# Patient Record
Sex: Female | Born: 1945 | Race: White | Hispanic: No | Marital: Married | Smoking: Never smoker
Health system: Southern US, Community
[De-identification: ages and names within clinical notes are randomized; demographics above are authoritative.]

## PROBLEM LIST (undated history)

## (undated) DIAGNOSIS — E785 Hyperlipidemia, unspecified: Secondary | ICD-10-CM

## (undated) DIAGNOSIS — M549 Dorsalgia, unspecified: Secondary | ICD-10-CM

## (undated) DIAGNOSIS — M1990 Unspecified osteoarthritis, unspecified site: Secondary | ICD-10-CM

## (undated) DIAGNOSIS — E7800 Pure hypercholesterolemia, unspecified: Secondary | ICD-10-CM

## (undated) HISTORY — PX: ABDOMINAL HYSTERECTOMY: SHX81

---

## 2008-09-18 ENCOUNTER — Ambulatory Visit: Payer: Self-pay | Admitting: Unknown Physician Specialty

## 2010-06-01 ENCOUNTER — Ambulatory Visit: Payer: Self-pay | Admitting: Internal Medicine

## 2013-09-10 DIAGNOSIS — K635 Polyp of colon: Secondary | ICD-10-CM | POA: Insufficient documentation

## 2015-04-08 DIAGNOSIS — M5440 Lumbago with sciatica, unspecified side: Secondary | ICD-10-CM | POA: Insufficient documentation

## 2017-03-17 DIAGNOSIS — E782 Mixed hyperlipidemia: Secondary | ICD-10-CM | POA: Insufficient documentation

## 2017-03-24 DIAGNOSIS — M85859 Other specified disorders of bone density and structure, unspecified thigh: Secondary | ICD-10-CM | POA: Insufficient documentation

## 2017-07-12 DIAGNOSIS — M1612 Unilateral primary osteoarthritis, left hip: Secondary | ICD-10-CM | POA: Insufficient documentation

## 2017-10-25 ENCOUNTER — Other Ambulatory Visit: Payer: Self-pay | Admitting: Orthopedic Surgery

## 2017-10-25 DIAGNOSIS — M5136 Other intervertebral disc degeneration, lumbar region: Secondary | ICD-10-CM

## 2017-10-25 DIAGNOSIS — M5417 Radiculopathy, lumbosacral region: Secondary | ICD-10-CM

## 2017-10-25 DIAGNOSIS — M5442 Lumbago with sciatica, left side: Secondary | ICD-10-CM

## 2017-11-02 ENCOUNTER — Encounter (INDEPENDENT_AMBULATORY_CARE_PROVIDER_SITE_OTHER): Payer: Self-pay

## 2017-11-02 ENCOUNTER — Ambulatory Visit
Admission: RE | Admit: 2017-11-02 | Discharge: 2017-11-02 | Disposition: A | Discharge status: Home / Self Care | Payer: Medicare Other | Source: Ambulatory Visit | Attending: Orthopedic Surgery | Admitting: Orthopedic Surgery

## 2017-11-02 DIAGNOSIS — M5417 Radiculopathy, lumbosacral region: Secondary | ICD-10-CM | POA: Diagnosis present

## 2017-11-02 DIAGNOSIS — M5136 Other intervertebral disc degeneration, lumbar region: Secondary | ICD-10-CM

## 2017-11-02 DIAGNOSIS — M5116 Intervertebral disc disorders with radiculopathy, lumbar region: Secondary | ICD-10-CM | POA: Diagnosis not present

## 2017-11-02 DIAGNOSIS — M5442 Lumbago with sciatica, left side: Secondary | ICD-10-CM | POA: Diagnosis present

## 2017-11-02 DIAGNOSIS — M48061 Spinal stenosis, lumbar region without neurogenic claudication: Secondary | ICD-10-CM | POA: Diagnosis not present

## 2019-02-26 NOTE — Discharge Instructions (Signed)
Instructions after Total Hip Replacement ° ° °  Powell Halbert P. Rodrecus Belsky, Jr., M.D.    ° Dept. of Orthopaedics & Sports Medicine ° [facility] ° [address] ° [place], [place]  [address] ° Phone: [phone]   Fax: [phone] ° °  °DIET: °• Drink plenty of non-alcoholic fluids. °• Resume your normal diet. Include foods high in fiber. ° °ACTIVITY:  °• You may use crutches or a walker with weight-bearing as tolerated, unless instructed otherwise. °• You may be weaned off of the walker or crutches by your Physical Therapist.  °• Do NOT reach below the level of your knees or cross your legs until allowed.    °• Continue doing gentle exercises. Exercising will reduce the pain and swelling, increase motion, and prevent muscle weakness.   °• Please continue to use the TED compression stockings for 6 weeks. You may remove the stockings at night, but should reapply them in the morning. °• Do not drive or operate any equipment until instructed. ° °WOUND CARE:  °• Continue to use ice packs periodically to reduce pain and swelling. °• Keep the incision clean and dry. °• You may bathe or shower after the staples are removed at the first office visit following surgery. ° °MEDICATIONS: °• You may resume your regular medications. °• Please take the pain medication as prescribed on the medication. °• Do not take pain medication on an empty stomach. °• You have been given a prescription for a blood thinner to prevent blood clots. Please take the medication as instructed. (NOTE: After completing a 2 week course of Lovenox, take one Enteric-coated aspirin once a day.) °• Pain medications and iron supplements can cause constipation. Use a stool softener (Senokot or Colace) on a daily basis and a laxative (dulcolax or miralax) as needed. °• Do not drive or drink alcoholic beverages when taking pain medications. ° °CALL THE OFFICE FOR: °• Temperature above 101 degrees °• Excessive bleeding or drainage on the dressing. °• Excessive  swelling, coldness, or paleness of the toes. °• Persistent nausea and vomiting. ° °FOLLOW-UP:  °• You should have an appointment to return to the office in 6 weeks after surgery. °• Arrangements have been made for continuation of Physical Therapy (either home therapy or outpatient therapy). °  °

## 2019-02-27 ENCOUNTER — Encounter
Admission: RE | Admit: 2019-02-27 | Discharge: 2019-02-27 | Disposition: A | Discharge status: Home / Self Care | Payer: Medicare Other | Source: Ambulatory Visit | Attending: Orthopedic Surgery | Admitting: Orthopedic Surgery

## 2019-02-27 ENCOUNTER — Other Ambulatory Visit: Payer: Self-pay

## 2019-02-27 ENCOUNTER — Other Ambulatory Visit: Payer: Medicare Other

## 2019-02-27 DIAGNOSIS — Z20828 Contact with and (suspected) exposure to other viral communicable diseases: Secondary | ICD-10-CM | POA: Diagnosis not present

## 2019-02-27 DIAGNOSIS — Z01812 Encounter for preprocedural laboratory examination: Secondary | ICD-10-CM | POA: Insufficient documentation

## 2019-02-27 DIAGNOSIS — Z01810 Encounter for preprocedural cardiovascular examination: Secondary | ICD-10-CM | POA: Insufficient documentation

## 2019-02-27 LAB — COMPREHENSIVE METABOLIC PANEL
ALT: 20 U/L (ref 0–44)
AST: 21 U/L (ref 15–41)
Albumin: 4.7 g/dL (ref 3.5–5.0)
Alkaline Phosphatase: 69 U/L (ref 38–126)
Anion gap: 9 (ref 5–15)
BUN: 24 mg/dL — ABNORMAL HIGH (ref 8–23)
CO2: 26 mmol/L (ref 22–32)
Calcium: 10 mg/dL (ref 8.9–10.3)
Chloride: 104 mmol/L (ref 98–111)
Creatinine, Ser: 0.97 mg/dL (ref 0.44–1.00)
GFR calc Af Amer: >60 mL/min (ref >60)
GFR calc non Af Amer: 57 mL/min — ABNORMAL LOW (ref >60)
Glucose, Bld: 101 mg/dL — ABNORMAL HIGH (ref 70–99)
Potassium: 4 mmol/L (ref 3.5–5.1)
Sodium: 139 mmol/L (ref 135–145)
Total Bilirubin: 0.6 mg/dL (ref 0.3–1.2)
Total Protein: 7.3 g/dL (ref 6.5–8.1)

## 2019-02-27 LAB — URINALYSIS, ROUTINE W REFLEX MICROSCOPIC
Bilirubin Urine: NEGATIVE
Glucose, UA: NEGATIVE mg/dL
Hgb urine dipstick: NEGATIVE
Ketones, ur: NEGATIVE mg/dL
Leukocytes,Ua: NEGATIVE
Nitrite: NEGATIVE
Protein, ur: NEGATIVE mg/dL
Specific Gravity, Urine: 1.008 (ref 1.005–1.030)
WBC, UA: NONE SEEN WBC/hpf (ref 0–5)
pH: 7 (ref 5.0–8.0)

## 2019-02-27 LAB — CBC WITH DIFFERENTIAL/PLATELET
Abs Immature Granulocytes: 0.04 10*3/uL (ref 0.00–0.07)
Basophils Absolute: 0.1 10*3/uL (ref 0.0–0.1)
Basophils Relative: 1 %
Eosinophils Absolute: 0.2 10*3/uL (ref 0.0–0.5)
Eosinophils Relative: 2 %
HCT: 44.8 % (ref 36.0–46.0)
Hemoglobin: 14.5 g/dL (ref 12.0–15.0)
Immature Granulocytes: 0 %
Lymphocytes Relative: 12 %
Lymphs Abs: 1.2 10*3/uL (ref 0.7–4.0)
MCH: 29.7 pg (ref 26.0–34.0)
MCHC: 32.4 g/dL (ref 30.0–36.0)
MCV: 91.8 fL (ref 80.0–100.0)
Monocytes Absolute: 0.6 10*3/uL (ref 0.1–1.0)
Monocytes Relative: 6 %
Neutro Abs: 8 10*3/uL — ABNORMAL HIGH (ref 1.7–7.7)
Neutrophils Relative %: 79 %
Platelets: 280 10*3/uL (ref 150–400)
RBC: 4.88 MIL/uL (ref 3.87–5.11)
RDW: 14.9 % (ref 11.5–15.5)
WBC: 10.1 10*3/uL (ref 4.0–10.5)
nRBC: 0 % (ref 0.0–0.2)

## 2019-02-27 LAB — TYPE AND SCREEN
ABO/RH(D): O POS
Antibody Screen: NEGATIVE

## 2019-02-27 LAB — SURGICAL PCR SCREEN
MRSA, PCR: NEGATIVE
Staphylococcus aureus: NEGATIVE

## 2019-02-27 LAB — SEDIMENTATION RATE: Sed Rate: 7 mm/hr (ref 0–30)

## 2019-02-27 LAB — PROTIME-INR
INR: 1 (ref 0.8–1.2)
Prothrombin Time: 13 seconds (ref 11.4–15.2)

## 2019-02-27 LAB — APTT: aPTT: 30 seconds (ref 24–36)

## 2019-02-27 LAB — C-REACTIVE PROTEIN: CRP: <0.8 mg/dL (ref <1.0)

## 2019-02-27 NOTE — Patient Instructions (Signed)
Your procedure is scheduled on: Wed. 9/16 Report to Day Surgery. To find out your arrival time please call [phone] between 1PM - 3PM on Tues 9/15.  Remember: Instructions that are not followed completely may result in serious medical risk,  up to and including death, or upon the discretion of your surgeon and anesthesiologist your  surgery may need to be rescheduled.     _X__ 1. Do not eat food after midnight the night before your procedure.                 No gum chewing or hard candies. You may drink clear liquids up to 2 hours                 before you are scheduled to arrive for your surgery- DO not drink clear                 liquids within 2 hours of the start of your surgery.                 Clear Liquids include:  water, apple juice without pulp, clear carbohydrate                 drink such as Clearfast of Gatorade, Black Coffee or Tea (Do not add                 anything to coffee or tea).  __X__2.  On the morning of surgery brush your teeth with toothpaste and water, you                may rinse your mouth with mouthwash if you wish.  Do not swallow any toothpaste of mouthwash.     _X__ 3.  No Alcohol for 24 hours before or after surgery.   ___ 4.  Do Not Smoke or use e-cigarettes For 24 Hours Prior to Your Surgery.                 Do not use any chewable tobacco products for at least 6 hours prior to                 surgery.  ____  5.  Bring all medications with you on the day of surgery if instructed.   _x___  6.  Notify your doctor if there is any change in your medical condition      (cold, fever, infections).     Do not wear jewelry, make-up, hairpins, clips or nail polish. Do not wear lotions, powders, or perfumes. You may wear deodorant. Do not shave 48 hours prior to surgery. Men may shave face and neck. Do not bring valuables to the hospital.    [facility] is not responsible for any belongings or valuables.  Contacts, dentures  or bridgework may not be worn into surgery. Leave your suitcase in the car. After surgery it may be brought to your room. For patients admitted to the hospital, discharge time is determined by your treatment team.   Patients discharged the day of surgery will not be allowed to drive home.   Please read over the following fact sheets that you were given:    _x___ Take these medicines the morning of surgery with A SIP OF WATER:    1. cetirizine (ZYRTEC) 10 MG tablet  2. traMADol (ULTRAM) 50 MG tablet  3.   4.  5.  6.  ____ Fleet Enema (as directed)   _x___ Use CHG Soap as directed  ____ Use inhalers on the day of surgery  ____ Stop metformin 2 days prior to surgery    ____ Take 1/2 of usual insulin dose the night before surgery. No insulin the morning          of surgery.   ____ Stop Coumadin/Plavix/aspirin on   __x__ Stop Anti-inflammatories meloxicam (MOBIC) 15 MG tablet  tomorrow   __x__ Stop supplements until after surgery. Flaxseed, Linseed, (FLAXSEED OIL) 1000 MG CAPS   ____ Bring C-Pap to the hospital.

## 2019-02-27 NOTE — Pre-Procedure Instructions (Signed)
Abnormal EKG, called Dr Ronelle Nigh .  Medical clearance requested.  Called and faxed Dr Clydell Hakim office and left message with Tiffany.

## 2019-02-28 LAB — URINE CULTURE
Culture: NO GROWTH
Special Requests: NORMAL

## 2019-03-02 ENCOUNTER — Other Ambulatory Visit
Admission: RE | Admit: 2019-03-02 | Discharge: 2019-03-02 | Disposition: A | Discharge status: Home / Self Care | Payer: Medicare Other | Source: Ambulatory Visit | Attending: Orthopedic Surgery | Admitting: Orthopedic Surgery

## 2019-03-02 ENCOUNTER — Other Ambulatory Visit: Payer: Medicare Other

## 2019-03-02 ENCOUNTER — Other Ambulatory Visit: Payer: Self-pay

## 2019-03-02 DIAGNOSIS — Z01812 Encounter for preprocedural laboratory examination: Secondary | ICD-10-CM | POA: Diagnosis not present

## 2019-03-03 LAB — SARS CORONAVIRUS 2 (TAT 6-24 HRS): SARS Coronavirus 2: NEGATIVE

## 2019-03-06 MED ORDER — TRANEXAMIC ACID-NACL 1000-0.7 MG/100ML-% IV SOLN
1000.0000 mg | INTRAVENOUS | Status: DC
  Filled 2019-03-06: qty 100

## 2019-03-07 ENCOUNTER — Inpatient Hospital Stay: Payer: Medicare Other | Admitting: Anesthesiology

## 2019-03-07 ENCOUNTER — Encounter: Payer: Self-pay | Admitting: Orthopedic Surgery

## 2019-03-07 ENCOUNTER — Encounter
Admission: RE | Disposition: A | Discharge status: Home Health | Payer: Self-pay | Source: Ambulatory Visit | Attending: Orthopedic Surgery

## 2019-03-07 ENCOUNTER — Inpatient Hospital Stay: Payer: Medicare Other

## 2019-03-07 ENCOUNTER — Other Ambulatory Visit: Payer: Self-pay

## 2019-03-07 ENCOUNTER — Inpatient Hospital Stay
Admission: RE | Admit: 2019-03-07 | Discharge: 2019-03-09 | DRG: 470 | Disposition: A | Discharge status: Home Health | Payer: Medicare Other | Source: Ambulatory Visit | Attending: Orthopedic Surgery | Admitting: Orthopedic Surgery

## 2019-03-07 DIAGNOSIS — M1612 Unilateral primary osteoarthritis, left hip: Secondary | ICD-10-CM | POA: Diagnosis present

## 2019-03-07 DIAGNOSIS — E785 Hyperlipidemia, unspecified: Secondary | ICD-10-CM | POA: Diagnosis present

## 2019-03-07 DIAGNOSIS — Z86010 Personal history of colonic polyps: Secondary | ICD-10-CM

## 2019-03-07 DIAGNOSIS — Z96642 Presence of left artificial hip joint: Secondary | ICD-10-CM

## 2019-03-07 DIAGNOSIS — Z79899 Other long term (current) drug therapy: Secondary | ICD-10-CM | POA: Diagnosis not present

## 2019-03-07 DIAGNOSIS — L309 Dermatitis, unspecified: Secondary | ICD-10-CM | POA: Insufficient documentation

## 2019-03-07 DIAGNOSIS — Z96649 Presence of unspecified artificial hip joint: Secondary | ICD-10-CM

## 2019-03-07 DIAGNOSIS — M25552 Pain in left hip: Secondary | ICD-10-CM | POA: Diagnosis present

## 2019-03-07 HISTORY — PX: TOTAL HIP ARTHROPLASTY: SHX124

## 2019-03-07 LAB — ABO/RH: ABO/RH(D): O POS

## 2019-03-07 SURGERY — ARTHROPLASTY, HIP, TOTAL,POSTERIOR APPROACH
Anesthesia: Spinal | Site: Hip | Laterality: Left

## 2019-03-07 MED ORDER — EPHEDRINE SULFATE 50 MG/ML IJ SOLN
INTRAMUSCULAR | Status: DC | PRN
  Administered 2019-03-07 (×4): 10 mg via INTRAVENOUS

## 2019-03-07 MED ORDER — ONDANSETRON HCL 4 MG PO TABS: 4.0000 mg | ORAL_TABLET | Freq: Four times a day (QID) | ORAL | Status: DC | PRN

## 2019-03-07 MED ORDER — ALUM & MAG HYDROXIDE-SIMETH 200-200-20 MG/5ML PO SUSP: 30.0000 mL | ORAL | Status: DC | PRN

## 2019-03-07 MED ORDER — FERROUS SULFATE 325 (65 FE) MG PO TABS
325.0000 mg | ORAL_TABLET | Freq: Two times a day (BID) | ORAL | Status: DC
  Administered 2019-03-07 – 2019-03-09 (×4): 325 mg via ORAL
  Filled 2019-03-07 (×4): qty 1

## 2019-03-07 MED ORDER — METOCLOPRAMIDE HCL 10 MG PO TABS: 5.0000 mg | ORAL_TABLET | Freq: Three times a day (TID) | ORAL | Status: DC | PRN

## 2019-03-07 MED ORDER — METOCLOPRAMIDE HCL 10 MG PO TABS
10.0000 mg | ORAL_TABLET | Freq: Three times a day (TID) | ORAL | Status: DC
  Administered 2019-03-07 – 2019-03-09 (×7): 10 mg via ORAL
  Filled 2019-03-07 (×7): qty 1

## 2019-03-07 MED ORDER — GABAPENTIN 300 MG PO CAPS
300.0000 mg | ORAL_CAPSULE | Freq: Every day | ORAL | Status: DC
  Administered 2019-03-07 – 2019-03-08 (×2): 300 mg via ORAL
  Filled 2019-03-07 (×2): qty 1

## 2019-03-07 MED ORDER — ONDANSETRON HCL 4 MG/2ML IJ SOLN: 4.0000 mg | Freq: Four times a day (QID) | INTRAMUSCULAR | Status: DC | PRN

## 2019-03-07 MED ORDER — ONDANSETRON HCL 4 MG/2ML IJ SOLN: 4.0000 mg | Freq: Once | INTRAMUSCULAR | Status: DC | PRN

## 2019-03-07 MED ORDER — BISACODYL 10 MG RE SUPP: 10.0000 mg | Freq: Every day | RECTAL | Status: DC | PRN

## 2019-03-07 MED ORDER — LACTATED RINGERS IV SOLN
INTRAVENOUS | Status: DC
  Administered 2019-03-07: 08:00:00 via INTRAVENOUS

## 2019-03-07 MED ORDER — DIPHENHYDRAMINE HCL 12.5 MG/5ML PO ELIX: 12.5000 mg | ORAL_SOLUTION | ORAL | Status: DC | PRN

## 2019-03-07 MED ORDER — ENSURE PRE-SURGERY PO LIQD
296.0000 mL | Freq: Once | ORAL | Status: DC
  Filled 2019-03-07: qty 296

## 2019-03-07 MED ORDER — CELECOXIB 200 MG PO CAPS
400.0000 mg | ORAL_CAPSULE | Freq: Once | ORAL | Status: AC
  Administered 2019-03-07: 08:00:00 400 mg via ORAL

## 2019-03-07 MED ORDER — CEFAZOLIN SODIUM-DEXTROSE 2-4 GM/100ML-% IV SOLN
2.0000 g | INTRAVENOUS | Status: AC
  Administered 2019-03-07: 09:00:00 2 g via INTRAVENOUS

## 2019-03-07 MED ORDER — BUPIVACAINE HCL (PF) 0.5 % IJ SOLN
INTRAMUSCULAR | Status: AC
  Filled 2019-03-07: qty 10

## 2019-03-07 MED ORDER — PROPOFOL 500 MG/50ML IV EMUL
INTRAVENOUS | Status: AC
  Filled 2019-03-07: qty 50

## 2019-03-07 MED ORDER — FAMOTIDINE 20 MG PO TABS
ORAL_TABLET | ORAL | Status: AC
  Administered 2019-03-07: 20 mg via ORAL
  Filled 2019-03-07: qty 1

## 2019-03-07 MED ORDER — TRANEXAMIC ACID-NACL 1000-0.7 MG/100ML-% IV SOLN
1000.0000 mg | Freq: Once | INTRAVENOUS | Status: AC
  Administered 2019-03-07: 1000 mg via INTRAVENOUS
  Filled 2019-03-07: qty 100

## 2019-03-07 MED ORDER — GABAPENTIN 300 MG PO CAPS
ORAL_CAPSULE | ORAL | Status: AC
  Administered 2019-03-07: 08:00:00 300 mg via ORAL
  Filled 2019-03-07: qty 1

## 2019-03-07 MED ORDER — BUPIVACAINE HCL (PF) 0.5 % IJ SOLN
INTRAMUSCULAR | Status: DC | PRN
  Administered 2019-03-07: 3 mL

## 2019-03-07 MED ORDER — OXYCODONE HCL 5 MG PO TABS: 10.0000 mg | ORAL_TABLET | ORAL | Status: DC | PRN

## 2019-03-07 MED ORDER — TRANEXAMIC ACID-NACL 1000-0.7 MG/100ML-% IV SOLN
INTRAVENOUS | Status: DC | PRN
  Administered 2019-03-07: 1000 mg via INTRAVENOUS

## 2019-03-07 MED ORDER — SODIUM CHLORIDE 0.9 % IV SOLN
INTRAVENOUS | Status: DC
  Administered 2019-03-07 – 2019-03-08 (×2): via INTRAVENOUS

## 2019-03-07 MED ORDER — OXYMETAZOLINE HCL 0.05 % NA SOLN
1.0000 | Freq: Two times a day (BID) | NASAL | Status: DC
  Administered 2019-03-07 – 2019-03-08 (×4): 1 via NASAL
  Filled 2019-03-07: qty 15

## 2019-03-07 MED ORDER — METOCLOPRAMIDE HCL 5 MG/ML IJ SOLN: 5.0000 mg | Freq: Three times a day (TID) | INTRAMUSCULAR | Status: DC | PRN

## 2019-03-07 MED ORDER — MAGNESIUM HYDROXIDE 400 MG/5ML PO SUSP
30.0000 mL | Freq: Every day | ORAL | Status: DC
  Administered 2019-03-07 – 2019-03-08 (×2): 30 mL via ORAL
  Filled 2019-03-07 (×2): qty 30

## 2019-03-07 MED ORDER — MENTHOL 3 MG MT LOZG
1.0000 | LOZENGE | OROMUCOSAL | Status: DC | PRN
  Filled 2019-03-07: qty 9

## 2019-03-07 MED ORDER — FLUTICASONE FUROATE 27.5 MCG/SPRAY NA SUSP: 1.0000 | Freq: Every evening | NASAL | Status: DC

## 2019-03-07 MED ORDER — PHENYLEPHRINE HCL (PRESSORS) 10 MG/ML IV SOLN
INTRAVENOUS | Status: AC
  Filled 2019-03-07: qty 1

## 2019-03-07 MED ORDER — CELECOXIB 200 MG PO CAPS
200.0000 mg | ORAL_CAPSULE | Freq: Two times a day (BID) | ORAL | Status: DC
  Administered 2019-03-07 – 2019-03-09 (×4): 200 mg via ORAL
  Filled 2019-03-07 (×4): qty 1

## 2019-03-07 MED ORDER — NEOMYCIN-POLYMYXIN B GU 40-200000 IR SOLN
Status: DC | PRN
  Administered 2019-03-07: 2 mL

## 2019-03-07 MED ORDER — SODIUM CHLORIDE (PF) 0.9 % IJ SOLN
INTRAMUSCULAR | Status: AC
  Filled 2019-03-07: qty 10

## 2019-03-07 MED ORDER — FENTANYL CITRATE (PF) 100 MCG/2ML IJ SOLN
25.0000 ug | INTRAMUSCULAR | Status: DC | PRN
  Administered 2019-03-07 (×2): 25 ug via INTRAVENOUS

## 2019-03-07 MED ORDER — FLEET ENEMA 7-19 GM/118ML RE ENEM: 1.0000 | ENEMA | Freq: Once | RECTAL | Status: DC | PRN

## 2019-03-07 MED ORDER — CHLORHEXIDINE GLUCONATE 4 % EX LIQD: 60.0000 mL | Freq: Once | CUTANEOUS | Status: DC

## 2019-03-07 MED ORDER — ENOXAPARIN SODIUM 30 MG/0.3ML ~~LOC~~ SOLN
30.0000 mg | Freq: Two times a day (BID) | SUBCUTANEOUS | Status: DC
  Administered 2019-03-08 – 2019-03-09 (×3): 30 mg via SUBCUTANEOUS
  Filled 2019-03-07 (×3): qty 0.3

## 2019-03-07 MED ORDER — PHENYLEPHRINE HCL (PRESSORS) 10 MG/ML IV SOLN
INTRAVENOUS | Status: DC | PRN
  Administered 2019-03-07 (×10): 100 ug via INTRAVENOUS

## 2019-03-07 MED ORDER — TRAMADOL HCL 50 MG PO TABS: 50.0000 mg | ORAL_TABLET | ORAL | Status: DC | PRN

## 2019-03-07 MED ORDER — ACETAMINOPHEN 10 MG/ML IV SOLN
INTRAVENOUS | Status: DC | PRN
  Administered 2019-03-07: 1000 mg via INTRAVENOUS

## 2019-03-07 MED ORDER — LORATADINE 10 MG PO TABS
10.0000 mg | ORAL_TABLET | Freq: Every day | ORAL | Status: DC
  Administered 2019-03-07 – 2019-03-09 (×3): 10 mg via ORAL
  Filled 2019-03-07 (×3): qty 1

## 2019-03-07 MED ORDER — FAMOTIDINE 20 MG PO TABS
20.0000 mg | ORAL_TABLET | Freq: Once | ORAL | Status: AC
  Administered 2019-03-07: 08:00:00 20 mg via ORAL

## 2019-03-07 MED ORDER — ACETAMINOPHEN 10 MG/ML IV SOLN
1000.0000 mg | Freq: Four times a day (QID) | INTRAVENOUS | Status: AC
  Administered 2019-03-07 – 2019-03-08 (×3): 1000 mg via INTRAVENOUS
  Filled 2019-03-07 (×3): qty 100

## 2019-03-07 MED ORDER — FENTANYL CITRATE (PF) 100 MCG/2ML IJ SOLN
INTRAMUSCULAR | Status: AC
  Administered 2019-03-07: 25 ug via INTRAVENOUS
  Filled 2019-03-07: qty 2

## 2019-03-07 MED ORDER — CELECOXIB 200 MG PO CAPS
ORAL_CAPSULE | ORAL | Status: AC
  Administered 2019-03-07: 08:00:00 400 mg via ORAL
  Filled 2019-03-07: qty 2

## 2019-03-07 MED ORDER — HYDROMORPHONE HCL 1 MG/ML IJ SOLN: 0.5000 mg | INTRAMUSCULAR | Status: DC | PRN

## 2019-03-07 MED ORDER — ACETAMINOPHEN 10 MG/ML IV SOLN
INTRAVENOUS | Status: AC
  Filled 2019-03-07: qty 100

## 2019-03-07 MED ORDER — PHENOL 1.4 % MT LIQD
1.0000 | OROMUCOSAL | Status: DC | PRN
  Filled 2019-03-07: qty 177

## 2019-03-07 MED ORDER — CEFAZOLIN SODIUM-DEXTROSE 2-4 GM/100ML-% IV SOLN
INTRAVENOUS | Status: AC
  Filled 2019-03-07: qty 100

## 2019-03-07 MED ORDER — DEXAMETHASONE SODIUM PHOSPHATE 10 MG/ML IJ SOLN
8.0000 mg | Freq: Once | INTRAMUSCULAR | Status: AC
  Administered 2019-03-07: 08:00:00 8 mg via INTRAVENOUS

## 2019-03-07 MED ORDER — ACETAMINOPHEN 325 MG PO TABS: 325.0000 mg | ORAL_TABLET | Freq: Four times a day (QID) | ORAL | Status: DC | PRN

## 2019-03-07 MED ORDER — GABAPENTIN 300 MG PO CAPS
300.0000 mg | ORAL_CAPSULE | Freq: Once | ORAL | Status: AC
  Administered 2019-03-07: 08:00:00 300 mg via ORAL

## 2019-03-07 MED ORDER — CEFAZOLIN SODIUM-DEXTROSE 2-4 GM/100ML-% IV SOLN
2.0000 g | Freq: Four times a day (QID) | INTRAVENOUS | Status: AC
  Administered 2019-03-07 – 2019-03-08 (×4): 2 g via INTRAVENOUS
  Filled 2019-03-07 (×5): qty 100

## 2019-03-07 MED ORDER — FLAXSEED OIL 1000 MG PO CAPS: 1000.0000 mg | ORAL_CAPSULE | Freq: Every day | ORAL | Status: DC

## 2019-03-07 MED ORDER — OXYCODONE HCL 5 MG PO TABS
5.0000 mg | ORAL_TABLET | ORAL | Status: DC | PRN
  Administered 2019-03-07 – 2019-03-08 (×2): 5 mg via ORAL
  Filled 2019-03-07 (×2): qty 1

## 2019-03-07 MED ORDER — PANTOPRAZOLE SODIUM 40 MG PO TBEC
40.0000 mg | DELAYED_RELEASE_TABLET | Freq: Two times a day (BID) | ORAL | Status: DC
  Administered 2019-03-07 – 2019-03-09 (×4): 40 mg via ORAL
  Filled 2019-03-07 (×4): qty 1

## 2019-03-07 MED ORDER — PROPOFOL 500 MG/50ML IV EMUL
INTRAVENOUS | Status: DC | PRN
  Administered 2019-03-07: 100 ug/kg/min via INTRAVENOUS

## 2019-03-07 MED ORDER — EPHEDRINE SULFATE 50 MG/ML IJ SOLN
INTRAMUSCULAR | Status: AC
  Filled 2019-03-07: qty 1

## 2019-03-07 MED ORDER — SENNOSIDES-DOCUSATE SODIUM 8.6-50 MG PO TABS
1.0000 | ORAL_TABLET | Freq: Two times a day (BID) | ORAL | Status: DC
  Administered 2019-03-07 – 2019-03-08 (×2): 1 via ORAL
  Filled 2019-03-07 (×3): qty 1

## 2019-03-07 MED ORDER — FLUOCINONIDE 0.05 % EX CREA
TOPICAL_CREAM | Freq: Two times a day (BID) | CUTANEOUS | Status: DC | PRN
  Filled 2019-03-07: qty 30

## 2019-03-07 MED ORDER — DEXAMETHASONE SODIUM PHOSPHATE 10 MG/ML IJ SOLN
INTRAMUSCULAR | Status: AC
  Administered 2019-03-07: 8 mg via INTRAVENOUS
  Filled 2019-03-07: qty 1

## 2019-03-07 SURGICAL SUPPLY — 54 items
BLADE DRUM FLTD (BLADE) ×3 IMPLANT
BLADE SAW 90X25X1.19 OSCILLAT (BLADE) ×3 IMPLANT
CANISTER SUCT 1200ML W/VALVE (MISCELLANEOUS) ×3 IMPLANT
CANISTER SUCT 3000ML PPV (MISCELLANEOUS) ×6 IMPLANT
CARTRIDGE OIL MAESTRO DRILL (MISCELLANEOUS) ×1 IMPLANT
COVER WAND RF STERILE (DRAPES) ×3 IMPLANT
CUP ACETBLR 48 OD 100 SERIES (Hips) ×3 IMPLANT
DIFFUSER DRILL AIR PNEUMATIC (MISCELLANEOUS) ×3 IMPLANT
DRAPE 3/4 80X56 (DRAPES) ×3 IMPLANT
DRAPE INCISE IOBAN 66X60 STRL (DRAPES) ×3 IMPLANT
DRSG DERMACEA 8X12 NADH (GAUZE/BANDAGES/DRESSINGS) ×3 IMPLANT
DRSG OPSITE POSTOP 4X12 (GAUZE/BANDAGES/DRESSINGS) ×3 IMPLANT
DRSG OPSITE POSTOP 4X14 (GAUZE/BANDAGES/DRESSINGS) IMPLANT
DRSG TEGADERM 4X4.75 (GAUZE/BANDAGES/DRESSINGS) ×3 IMPLANT
DURAPREP 26ML APPLICATOR (WOUND CARE) ×3 IMPLANT
ELECT REM PT RETURN 9FT ADLT (ELECTROSURGICAL) ×3
ELECTRODE REM PT RTRN 9FT ADLT (ELECTROSURGICAL) ×1 IMPLANT
GLOVE BIOGEL M STRL SZ7.5 (GLOVE) ×6 IMPLANT
GLOVE INDICATOR 8.0 STRL GRN (GLOVE) ×3 IMPLANT
GOWN STRL REUS W/ TWL LRG LVL3 (GOWN DISPOSABLE) ×2 IMPLANT
GOWN STRL REUS W/TWL LRG LVL3 (GOWN DISPOSABLE) ×4
HEAD FEM STD 32X+1 STRL (Hips) ×3 IMPLANT
HEMOVAC 400CC 10FR (MISCELLANEOUS) ×3 IMPLANT
HOLDER FOLEY CATH W/STRAP (MISCELLANEOUS) ×3 IMPLANT
HOOD PEEL AWAY FLYTE STAYCOOL (MISCELLANEOUS) ×6 IMPLANT
KIT TURNOVER KIT A (KITS) ×3 IMPLANT
LINER ACET 32X48 (Liner) ×3 IMPLANT
MANIFOLD NEPTUNE II (INSTRUMENTS) ×3 IMPLANT
NDL SAFETY ECLIPSE 18X1.5 (NEEDLE) ×1 IMPLANT
NEEDLE HYPO 18GX1.5 SHARP (NEEDLE) ×2
NS IRRIG 500ML POUR BTL (IV SOLUTION) ×3 IMPLANT
OIL CARTRIDGE MAESTRO DRILL (MISCELLANEOUS) ×3
PACK HIP PROSTHESIS (MISCELLANEOUS) ×3 IMPLANT
PENCIL SMOKE ULTRAEVAC 22 CON (MISCELLANEOUS) ×3 IMPLANT
PIN STEIN THRED 5/32 (Pin) ×3 IMPLANT
PULSAVAC PLUS IRRIG FAN TIP (DISPOSABLE) ×3
SOL .9 NS 3000ML IRR  AL (IV SOLUTION) ×2
SOL .9 NS 3000ML IRR UROMATIC (IV SOLUTION) ×1 IMPLANT
SOL PREP PVP 2OZ (MISCELLANEOUS) ×3
SOLUTION PREP PVP 2OZ (MISCELLANEOUS) ×1 IMPLANT
SPONGE DRAIN TRACH 4X4 STRL 2S (GAUZE/BANDAGES/DRESSINGS) ×3 IMPLANT
STAPLER SKIN PROX 35W (STAPLE) ×3 IMPLANT
STEM AML 12X155X30 STD SM 6IN (Joint) ×3 IMPLANT
SUT ETHIBOND #5 BRAIDED 30INL (SUTURE) ×3 IMPLANT
SUT VIC AB 0 CT1 36 (SUTURE) ×3 IMPLANT
SUT VIC AB 1 CT1 36 (SUTURE) ×6 IMPLANT
SUT VIC AB 2-0 CT1 27 (SUTURE) ×2
SUT VIC AB 2-0 CT1 TAPERPNT 27 (SUTURE) ×1 IMPLANT
SYR 20ML LL LF (SYRINGE) ×3 IMPLANT
TAPE CLOTH 3X10 WHT NS LF (GAUZE/BANDAGES/DRESSINGS) ×3 IMPLANT
TAPE TRANSPORE STRL 2 31045 (GAUZE/BANDAGES/DRESSINGS) ×3 IMPLANT
TIP FAN IRRIG PULSAVAC PLUS (DISPOSABLE) ×1 IMPLANT
TOWEL OR 17X26 4PK STRL BLUE (TOWEL DISPOSABLE) ×3 IMPLANT
TRAY FOLEY MTR SLVR 16FR STAT (SET/KITS/TRAYS/PACK) ×3 IMPLANT

## 2019-03-07 NOTE — Anesthesia Preprocedure Evaluation (Signed)
Anesthesia Evaluation  Patient identified by MRN, date of birth, ID band Patient awake    Reviewed: Allergy & Precautions, NPO status , Patient's Chart, lab work & pertinent test results  History of Anesthesia Complications Negative for: history of anesthetic complications  Airway Mallampati: III       Dental   Pulmonary neg sleep apnea, neg COPD, Not current smoker,           Cardiovascular (-) hypertension(-) Past MI and (-) CHF (-) dysrhythmias (-) Valvular Problems/Murmurs     Neuro/Psych neg Seizures    GI/Hepatic Neg liver ROS, GERD  Controlled,  Endo/Other  neg diabetes  Renal/GU negative Renal ROS     Musculoskeletal   Abdominal   Peds  Hematology   Anesthesia Other Findings   Reproductive/Obstetrics                             Anesthesia Physical Anesthesia Plan  ASA: II  Anesthesia Plan: Spinal   Post-op Pain Management:    Induction:   PONV Risk Score and Plan:   Airway Management Planned:   Additional Equipment:   Intra-op Plan:   Post-operative Plan:   Informed Consent: I have reviewed the patients History and Physical, chart, labs and discussed the procedure including the risks, benefits and alternatives for the proposed anesthesia with the patient or authorized representative who has indicated his/her understanding and acceptance.       Plan Discussed with:   Anesthesia Plan Comments:         Anesthesia Quick Evaluation

## 2019-03-07 NOTE — Progress Notes (Signed)
PHARMACIST - PHYSICIAN ORDER COMMUNICATION  CONCERNING: P&T Medication Policy on Herbal Medications  DESCRIPTION:  This patient's order for:  Flaxseed Oil CAPS 1,000 mg  has been noted.  This product(s) is classified as an "herbal" or natural product. Due to a lack of definitive safety studies or FDA approval, nonstandard manufacturing practices, plus the potential risk of unknown drug-drug interactions while on inpatient medications, the Pharmacy and Therapeutics Committee does not permit the use of "herbal" or natural products of this type within [facility].   ACTION TAKEN: The pharmacy department is unable to verify this order at this time.  Please reevaluate patient's clinical condition at discharge and address if the herbal or natural product(s) should be resumed at that time.  Pernell Dupre, PharmD, BCPS Clinical Pharmacist 03/07/2019 1:47 PM

## 2019-03-07 NOTE — Anesthesia Post-op Follow-up Note (Signed)
Anesthesia QCDR form completed.        

## 2019-03-07 NOTE — Transfer of Care (Signed)
Immediate Anesthesia Transfer of Care Note  Patient: Lindsey Freeman  Procedure(s) Performed: Procedure(s): TOTAL HIP ARTHROPLASTY (Left)  Patient Location: PACU  Anesthesia Type:Spinal  Level of Consciousness: awake, alert  and oriented  Airway & Oxygen Therapy: Patient Spontanous Breathing and Patient connected to face mask oxygen  Post-op Assessment: Report given to RN and Post -op Vital signs reviewed and stable  Post vital signs: Reviewed and stable  Last Vitals:  Vitals:   03/07/19 0720 03/07/19 1147  BP: (!) 157/76 115/68  Pulse: 78   Resp:  14  Temp: 36.6 C (!) 36.3 C  SpO2: 123XX123 [id number]    Complications: No apparent anesthesia complications

## 2019-03-07 NOTE — H&P (Signed)
The patient has been re-examined, and the chart reviewed, and there have been no interval changes to the documented history and physical.    The risks, benefits, and alternatives have been discussed at length. The patient expressed understanding of the risks benefits and agreed with plans for surgical intervention.  James P. Hooten, Jr. M.D.    

## 2019-03-07 NOTE — TOC Progression Note (Signed)
Transition of Care [facility]) - Progression Note    Patient Details  Name: Lindsey Freeman MRN: [id number] Date of Birth: 05-22-1946  [facility] of [facility]) CM/SW Contact  Su Hilt, RN Phone Number: 03/07/2019, 9:47 AM  Clinical Narrative:     Requested the price of Lovenox, will notify the patient once obtained       Expected Discharge Plan and Services                                                 Social Determinants of Health (SDOH) Interventions    Readmission Risk Interventions No flowsheet data found.

## 2019-03-07 NOTE — Evaluation (Signed)
Physical Therapy Evaluation Patient Details Name: Lindsey Freeman MRN: [id number] DOB: Apr 08, 1946 Today's Date: 03/07/2019   History of Present Illness  Pt. is a 73 y/o F s/p THA; PMH is unremarkable. Posterior hip precautions apply.  Clinical Impression  Upon entry, pt was positioned in supine on her bed with pain rated at a 6/10 on NPS. She is expecting to return home to a single-level home with 5 stairs to enter and a R rail. Her husband will be available 24/7 following to assist. Pt was educated on her precautions at the start of the session. Post-op exercises were performed without physical assistance. She was able to perform bed mobility activities with min assist required to maneuver the surgical leg and verbal cueing to prevent excess hip flexion. Transfer sit<>stand required min guard as well as verbal cueing to prevent excess hip flexion. Pt was able to amb ~ 6 ft during treatment today with use of RW. Amb speed was slow and with reduced stride length; cueing required to prevent toeing in during turns and CGA from PT throughout movement. Pt will continue to benefit from skilled PT to address deficits in strength, ROM, mobility, and pain management. Current recommendation for follow-up care is HHPT.    Follow Up Recommendations Home health PT    Equipment Recommendations  None recommended by PT(Pt. has access to RW and [facility])    Recommendations for Other Services       Precautions / Restrictions Precautions Precautions: Posterior Hip Precaution Booklet Issued: Yes (comment) Restrictions Weight Bearing Restrictions: Yes(WBAT)      Mobility  Bed Mobility Overal bed mobility: Needs Assistance Bed Mobility: Supine to Sit     Supine to sit: Min assist     General bed mobility comments: Min assist for maneuvering surgical leg; cueing to prevent hip flexion  Transfers Overall transfer level: Needs assistance Equipment used: Rolling walker (2 wheeled) Transfers: Sit to/from  Stand Sit to Stand: Min guard         General transfer comment: Cueing required to prevent hip flexion   Ambulation/Gait Ambulation/Gait assistance: Min guard Gait Distance (Feet): 6 Feet Assistive device: Rolling walker (2 wheeled) Gait Pattern/deviations: Step-through pattern;Decreased stride length;Antalgic     General Gait Details: Pt. amb 6 ft with antalgic gate 2/2 L THA. Amb speed was slow and with reduced stride lengthi; cueing required to prevent toeing in during turns  Financial trader Rankin (Stroke Patients Only)       Balance                                             Pertinent Vitals/Pain Pain Assessment: 0-10 Pain Score: 6  Pain Location: L hip Pain Descriptors / Indicators: Discomfort;Sore;Aching Pain Intervention(s): Limited activity within patient's tolerance    Home Living Family/patient expects to be discharged to:: Private residence Living Arrangements: Spouse/significant other Available Help at Discharge: Family Type of Home: House Home Access: Stairs to enter Entrance Stairs-Rails: Right Entrance Stairs-Number of Steps: 5(stairs are long) Home Layout: One level Home Equipment: Environmental consultant - 2 wheels;Cane - single point;Bedside commode;Grab bars - toilet      Prior Function Level of Independence: Independent with assistive device(s)         Comments: Pt used SPC to amb prior to surgery  Hand Dominance        Extremity/Trunk Assessment   Upper Extremity Assessment Upper Extremity Assessment: Overall WFL for tasks assessed    Lower Extremity Assessment Lower Extremity Assessment: LLE deficits/detail LLE Deficits / Details: L LE weakness 2/2 THA       Communication   Communication: No difficulties  Cognition Arousal/Alertness: Awake/alert Behavior During Therapy: WFL for tasks assessed/performed Overall Cognitive Status: Within Functional Limits for tasks assessed                                         General Comments      Exercises Total Joint Exercises Ankle Circles/Pumps: 10 reps;Supine Quad Sets: 10 reps;Supine Gluteal Sets: 10 reps;Supine Hip ABduction/ADduction: 10 reps;Supine Straight Leg Raises: 10 reps;Supine   Assessment/Plan    PT Assessment Patient needs continued PT services  PT Problem List Decreased strength;Decreased range of motion;Decreased balance;Decreased mobility       PT Treatment Interventions Functional mobility training;Stair training;Gait training;Therapeutic activities;Therapeutic exercise;Balance training;Patient/family education    PT Goals (Current goals can be found in the Care Plan section)  Acute Rehab PT Goals Patient Stated Goal: to return home PT Goal Formulation: With patient Potential to Achieve Goals: Good    Frequency BID   Barriers to discharge        Co-evaluation               AM-PAC PT "6 Clicks" Mobility  Outcome Measure Help needed turning from your back to your side while in a flat bed without using bedrails?: None Help needed moving from lying on your back to sitting on the side of a flat bed without using bedrails?: A Little Help needed moving to and from a bed to a chair (including a wheelchair)?: A Little Help needed standing up from a chair using your arms (e.g., wheelchair or bedside chair)?: A Little Help needed to walk in hospital room?: A Little Help needed climbing 3-5 steps with a railing? : A Little 6 Click Score: 19    End of Session Equipment Utilized During Treatment: Gait belt Activity Tolerance: Patient tolerated treatment well Patient left: in chair;with call bell/phone within reach;with chair alarm set;with SCD's reapplied   PT Visit Diagnosis: Other abnormalities of gait and mobility (R26.89);Muscle weakness (generalized) (M62.81)    Time: 1531-1600 PT Time Calculation (min) (ACUTE ONLY): 29 min   Charges:   PT Evaluation $PT  Eval Low Complexity: 1 Low PT Treatments $Therapeutic Exercise: 8-22 mins       Carlisia Geno, SPT {03/07/2019, 4:43 PM

## 2019-03-07 NOTE — TOC Benefit Eligibility Note (Signed)
Transition of Care [facility]) Benefit Eligibility Note    Patient Details  Name: Lindsey Freeman MRN: [id number] Date of Birth: 05/15/1946   Medication/Dose: Enoxaparin 40 mg daily x 14 days (generic recommended)  Covered?: Yes     Prescription Coverage Preferred Pharmacy: [facility], or [facility]  Spoke with Person/Company/Phone Number:: Vaughan Basta, [employer], [phone]  Co-Pay: [phone] for Enoxaparin for 14 days  Prior Approval: No     Additional Notes: Lovenox or Enoxparin would be $10 through mail order    [place] Phone Number: 03/07/2019, 1:45 PM

## 2019-03-07 NOTE — Anesthesia Procedure Notes (Signed)
Spinal  Patient location during procedure: OR Start time: 03/07/2019 8:25 AM End time: 03/07/2019 8:39 AM Staffing Anesthesiologist: Gunnar Fusi, MD Resident/CRNA: Mina Marble D, CRNA Performed: anesthesiologist and resident/CRNA  Preanesthetic Checklist Completed: patient identified, site marked, surgical consent, pre-op evaluation, timeout performed, IV checked, risks and benefits discussed and monitors and equipment checked Spinal Block Patient position: sitting Prep: ChloraPrep Patient monitoring: heart rate, continuous pulse ox and blood pressure Approach: midline Location: L3-4 Injection technique: single-shot Needle Needle type: Pencan  Needle gauge: 24 G Needle length: 10 cm Assessment Sensory level: T6 Additional Notes 1st attempt by Nyoka Cowden CRNA unsuccessful, Pt tolerated well, moved down a level, 2nd attempt by Ronelle Nigh MD successful, Pt tolerated well w/o complication

## 2019-03-07 NOTE — Op Note (Signed)
OPERATIVE NOTE  DATE OF SURGERY:  03/07/2019  PATIENT NAME:  Lindsey Freeman   DOB: [birth year]  MRN: [id number]  PRE-OPERATIVE DIAGNOSIS: Degenerative arthrosis of the left hip, primary  POST-OPERATIVE DIAGNOSIS:  Same  PROCEDURE:  Left total hip arthroplasty  SURGEON:  Marciano Sequin. M.D.  ASSISTANT: Vance Peper, PA (present and scrubbed throughout the case, critical for assistance with exposure, retraction, instrumentation, and closure)  ANESTHESIA: spinal  ESTIMATED BLOOD LOSS: 75 mL  FLUIDS REPLACED: 800 mL of crystalloid  DRAINS: 2 medium drains to a Hemovac reservoir  IMPLANTS UTILIZED: DePuy 12 mm small stature AML femoral stem, 48 mm OD Pinnacle 100 acetabular component, neutral Pinnacle Altrx polyethylene insert, and a 32 mm CoCr +1 mm hip ball  INDICATIONS FOR SURGERY: Lindsey Freeman is a 73 y.o. year old female with a long history of progressive hip and groin  pain. X-rays demonstrated severe degenerative changes. The patient had not seen any significant improvement despite conservative nonsurgical intervention. After discussion of the risks and benefits of surgical intervention, the patient expressed understanding of the risks benefits and agree with plans for total hip arthroplasty.   The risks, benefits, and alternatives were discussed at length including but not limited to the risks of infection, bleeding, nerve injury, stiffness, blood clots, the need for revision surgery, limb length inequality, dislocation, cardiopulmonary complications, among others, and they were willing to proceed.  PROCEDURE IN DETAIL: The patient was brought into the operating room and, after adequate spinal anesthesia was achieved, the patient was placed in a right lateral decubitus position. Axillary roll was placed and all bony prominences were well-padded. The patient's left hip was cleaned and prepped with alcohol and DuraPrep and draped in the usual sterile fashion. A "timeout" was performed  as per usual protocol. A lateral curvilinear incision was made gently curving towards the posterior superior iliac spine. The IT band was incised in line with the skin incision and the fibers of the gluteus maximus were split in line. The piriformis tendon was identified, skeletonized, and incised at its insertion to the proximal femur and reflected posteriorly. A T type posterior capsulotomy was performed. Prior to dislocation of the femoral head, a threaded Steinmann pin was inserted through a separate stab incision into the pelvis superior to the acetabulum and bent in the form of a stylus so as to assess limb length and hip offset throughout the procedure. The femoral head was then dislocated posteriorly. Inspection of the femoral head demonstrated severe degenerative changes with full-thickness loss of articular cartilage. The femoral neck cut was performed using an oscillating saw. The anterior capsule was elevated off of the femoral neck using a periosteal elevator. Attention was then directed to the acetabulum. The remnant of the labrum was excised using electrocautery. Inspection of the acetabulum also demonstrated significant degenerative changes. The acetabulum was reamed in sequential fashion up to a 47 mm diameter. Good punctate bleeding bone was encountered. A 48 mm Pinnacle 100 acetabular component was positioned and impacted into place. Good scratch fit was appreciated. A neutral polyethylene trial was inserted.  Attention was then directed to the proximal femur. A hole for reaming of the proximal femoral canal was created using a high-speed burr. The femoral canal was reamed in sequential fashion up to a 11.5 mm diameter. This allowed for approximately 7 cm of scratch fit.  It was thus elected to ream up to a 12 mm diameter to allow for a line to line fit.  Serial broaches  were inserted up to a 12 mm small stature femoral broach. Calcar region was planed and a trial reduction was performed using a  32 mm hip ball with a +1 mm neck length. Good equalization of limb lengths and hip offset was appreciated and excellent stability was noted both anteriorly and posteriorly. Trial components were removed. The acetabular shell was irrigated with copious amounts of normal saline with antibiotic solution and suctioned dry. A neutral Pinnacle Altrx polyethylene insert was positioned and impacted into place. Next, a 12 mm small stature AML femoral stem was positioned and impacted into place. Excellent scratch fit was appreciated. A trial reduction was again performed with a 32 mm hip ball with a +1 mm neck length. Again, good equalization of limb lengths was appreciated and excellent stability appreciated both anteriorly and posteriorly. The hip was then dislocated and the trial hip ball was removed. The Morse taper was cleaned and dried. A 32 mm cobalt chromium hip ball with a +1 mm neck length was placed on the trunnion and impacted into place. The hip was then reduced and placed through range of motion. Excellent stability was appreciated both anteriorly and posteriorly.  The wound was irrigated with copious amounts of normal saline with antibiotic solution and suctioned dry. Good hemostasis was appreciated. The posterior capsulotomy was repaired using #5 Ethibond. Piriformis tendon was reapproximated to the undersurface of the gluteus medius tendon using #5 Ethibond. Two medium drains were placed in the wound bed and brought out through separate stab incisions to be attached to a Hemovac reservoir. The IT band was reapproximated using interrupted sutures of #1 Vicryl. Subcutaneous tissue was approximated using first #0 Vicryl followed by #2-0 Vicryl. The skin was closed with skin staples.  The patient tolerated the procedure well and was transported to the recovery room in stable condition.   Marciano Sequin., M.D.

## 2019-03-08 MED ORDER — ENOXAPARIN SODIUM 40 MG/0.4ML ~~LOC~~ SOLN: 40.0000 mg | SUBCUTANEOUS | 0 refills | Status: DC

## 2019-03-08 MED ORDER — CELECOXIB 200 MG PO CAPS: 200.0000 mg | ORAL_CAPSULE | Freq: Two times a day (BID) | ORAL | 1 refills | Status: DC

## 2019-03-08 MED ORDER — OXYCODONE HCL 5 MG PO TABS: 5.0000 mg | ORAL_TABLET | ORAL | 0 refills | Status: DC | PRN

## 2019-03-08 MED ORDER — TRAMADOL HCL 50 MG PO TABS: 50.0000 mg | ORAL_TABLET | ORAL | 1 refills | Status: DC | PRN

## 2019-03-08 NOTE — Evaluation (Signed)
Occupational Therapy Evaluation Patient Details Name: Lindsey Freeman MRN: [id number] DOB: 1946/04/03 Today's Date: 03/08/2019    History of Present Illness Pt. is a 73 y/o F s/p THA; PMH is unremarkable. Posterior hip precautions apply.   Clinical Impression   Pt is 73 year old female s/p L THA(posterior)  who lives at home with her husband. Pt was independent in all ADLs prior to surgery, ambulated with a SPC and is eager to return to PLOF.  Pt is currently limited in functional ADLs due to pain and decreased ROM.  She was asking about resources for washing her hair until she is able to take a shower using her shower stall at home with built in seat.  Reviewed rec AD and options for shampooing hair with shampoo cap in the [employer] and given for patient to take home as a resource.  Pt requires min assist for LB dressing and bathing skills due to pain and decreased AROM of L LE with posterior hip precautions and would benefit from continued skilled OT services for education in assistive devices, functional mobility, and education in recommendations for home modifications to increase safety and prevent falls.  Pt is a good candidate for OT HH to continue rehabilitation.      Follow Up Recommendations  Home health OT    Equipment Recommendations  Other (comment)(reacher, sock aid, LH shoe horn, elastic shoe laces)    Recommendations for Other Services       Precautions / Restrictions Precautions Precautions: Posterior Hip Restrictions Weight Bearing Restrictions: Yes LLE Weight Bearing: Weight bearing as tolerated      Mobility Bed Mobility                  Transfers                      Balance                                           ADL either performed or assessed with clinical judgement   ADL Overall ADL's : Needs assistance/impaired Eating/Feeding: Independent;Set up   Grooming: Wash/dry hands;Wash/dry face;Oral care;Brushing  hair;Set up;Independent;Sitting   Upper Body Bathing: Independent;Set up   Lower Body Bathing: Set up;Moderate assistance   Upper Body Dressing : Independent;Set up   Lower Body Dressing: Set up;With adaptive equipment;Sit to/from stand;Minimal assistance               Functional mobility during ADLs: Minimal assistance;Set up;Rolling walker General ADL Comments: Pt is progressing well and pain is 2/10 in L hip and requires min assist for LB dressing skills with reacher and sock aid and for ambulating to toilet with FWW with min guard once she is standing with good balance.  Pt asking about how to shampoo hair since she cannot lean forward and gave her the [facility] catalog with shampoo cap option and AD that is rec for dressing.     Vision Baseline Vision/History: Wears glasses Wears Glasses: At all times Patient Visual Report: No change from baseline       Perception     Praxis      Pertinent Vitals/Pain Pain Assessment: 0-10 Pain Score: 2  Pain Location: L hip Pain Descriptors / Indicators: Discomfort;Sore;Aching Pain Intervention(s): Limited activity within patient's tolerance;Monitored during session;Premedicated before session;Repositioned;Ice applied     Hand Dominance Right  Extremity/Trunk Assessment Upper Extremity Assessment Upper Extremity Assessment: Overall WFL for tasks assessed   Lower Extremity Assessment Lower Extremity Assessment: Defer to PT evaluation       Communication Communication Communication: No difficulties   Cognition Arousal/Alertness: Awake/alert Behavior During Therapy: WFL for tasks assessed/performed Overall Cognitive Status: Within Functional Limits for tasks assessed                                     General Comments       Exercises     Shoulder Instructions      Home Living Family/patient expects to be discharged to:: Private residence Living Arrangements: Spouse/significant other Available  Help at Discharge: Family Type of Home: House Home Access: Stairs to enter Technical brewer of Steps: 5 Entrance Stairs-Rails: Right Home Layout: One level     Bathroom Shower/Tub: Walk-in shower;Door   [employer] Toilet: Handicapped height Bathroom Accessibility: Yes How Accessible: Accessible via walker Home Equipment: [place] - 2 wheels;Cane - single point;Bedside commode;Grab bars - toilet;Shower seat - built Hotel manager: Reacher        Prior Functioning/Environment Level of Independence: Independent with assistive device(s)        Comments: Pt used SPC to amb prior to surgery        OT Problem List: Decreased strength;Decreased range of motion;Decreased activity tolerance;Impaired balance (sitting and/or standing);Pain      OT Treatment/Interventions: Self-care/ADL training;Patient/family education;Balance training;DME and/or AE instruction    OT Goals(Current goals can be found in the care plan section) Acute Rehab OT Goals Patient Stated Goal: to return home OT Goal Formulation: With patient Time For Goal Achievement: 03/22/19 Potential to Achieve Goals: Good ADL Goals Pt Will Perform Lower Body Dressing: with set-up;with supervision;with adaptive equipment;sit to/from stand Pt Will Transfer to Toilet: with set-up;with supervision;bedside commode;regular height toilet;stand pivot transfer  OT Frequency: Min 2X/week   Barriers to D/C:            Co-evaluation              AM-PAC OT "6 Clicks" Daily Activity     Outcome Measure Help from another person eating meals?: None Help from another person taking care of personal grooming?: None Help from another person toileting, which includes using toliet, bedpan, or urinal?: A Little Help from another person bathing (including washing, rinsing, drying)?: A Little Help from another person to put on and taking off regular upper body clothing?: None Help from another person to  put on and taking off regular lower body clothing?: A Little 6 Click Score: 21   End of Session Nurse Communication: Other (comment)(needs chair alarm on and SCDs)  Activity Tolerance: Patient tolerated treatment well Patient left: in chair;with call bell/phone within reach;Other (comment)(with NSG in room and to set chair alarm and place SCDs on after they give meds)  OT Visit Diagnosis: Other abnormalities of gait and mobility (R26.89);Pain Pain - Right/Left: Left Pain - part of body: Hip                Time: 0925-0950 OT Time Calculation (min): 25 min Charges:  OT General Charges $OT Visit: 1 Visit OT Evaluation $OT Eval Low Complexity: 1 Low OT Treatments $Self Care/Home Management : 8-22 mins  Chrys Racer, OTR/L, [place] ascom [phone] 03/08/19, 10:09 AM

## 2019-03-08 NOTE — TOC Initial Note (Signed)
Transition of Care [facility]) - Initial/Assessment Note    Patient Details  Name: Lindsey Freeman MRN: [id number] Date of Birth: 05/20/1946  Transition [facility]) CM/SW Contact:    Madilyn Cephas, Lenice Llamas Phone Number: [phone]  03/08/2019, 11:49 AM  Clinical Narrative: PT and OT are recommending home health. Clinical Education officer, museum (CSW) met with patient alone at bedside to discuss D/C plan. Patient was alert and oriented X4 and was sitting up in the chair at bedside. CSW introduced self and explained role of CSW department. Per patient she lives in [place] with her husband Elenore Rota. Patient asked if she can do outpatient PT at [facility] in [place]. CSW made patient aware that Dr. Marry Guan prefers for patient to do 2 weeks of home health first. Patient is agreeable to home health and does not have home health agency preference. CSW made patient aware that Dr. Marry Guan prefers [facility]. Per Helene Kelp [facility] can accept patient for PT and OT. Patient is agreeable to [facility]. CSW provided patient with CMS home health list. Patient reported that she has a rolling walker at home and does not need a bedside commode. CSW made patient aware that her Lovenox price is $13. Patient verbalized her understanding and reported no needs or concerns. CSW will continue to follow and assist as needed.                Expected Discharge Plan: [place] Barriers to Discharge: Continued Medical Work up   Patient Goals and CMS Choice Patient states their goals for this hospitalization and ongoing recovery are:: To go home. CMS Medicare.gov Compare Post Acute Care list provided to:: Patient Choice offered to / list presented to : Patient  Expected Discharge Plan and Services Expected Discharge Plan: [place] In-house Referral: Clinical Social Work   Post Acute Care Choice: [place] arrangements for the past 2 months: [place]    DME Arranged: N/A         HH Arranged: PT, OT [place] Agency: [facility] [employer] [facility] [employer] Date [place]: 03/07/19 Time [place]: 4580 Representative spoke with at [place]: [place] Arrangements/Services Living arrangements for the past 2 months: [place] Lives with:: Spouse Patient language and need for interpreter reviewed:: No Do you feel safe going back to the place where you live?: Yes      Need for Family Participation in Patient Care: No (Comment) Care giver support system in place?: Yes (comment) Current home services: DME(Patient has a rolling walker at home.) Criminal Activity/Legal Involvement Pertinent to Current Situation/Hospitalization: No - Comment as needed  Activities of Daily Living Home Assistive Devices/Equipment: Eyeglasses, Cane (specify quad or straight), Walker (specify type) ADL Screening (condition at time of admission) Patient's cognitive ability adequate to safely complete daily activities?: Yes Is the patient deaf or have difficulty hearing?: No Does the patient have difficulty seeing, even when wearing glasses/contacts?: No Does the patient have difficulty concentrating, remembering, or making decisions?: No Patient able to express need for assistance with ADLs?: Yes Does the patient have difficulty dressing or bathing?: No Independently performs ADLs?: Yes (appropriate for developmental age) Does the patient have difficulty walking or climbing stairs?: Yes Weakness of Legs: Left Weakness of Arms/Hands: None  Permission Sought/Granted Permission sought to share information with : Other (comment)(Home Health agency) Permission granted to share information with : Yes, Verbal Permission Granted  Emotional Assessment Appearance:: Appears stated age Attitude/Demeanor/Rapport: Engaged Affect (typically observed): Calm, Pleasant Orientation: : Oriented to Self, Oriented to  Place, Oriented to  Time, Oriented to Situation Alcohol / Substance Use: Not Applicable Psych Involvement: No (comment)  Admission diagnosis:  PRIMARY OSTEOARTHRITIS OF LEFT HIP Patient Active Problem List   Diagnosis Date Noted  . Eczema 03/07/2019  . H/O total hip arthroplasty 03/07/2019  . Primary osteoarthritis of left hip 07/12/2017  . Osteopenia of neck of femur 03/24/2017  . Hyperlipidemia, mixed 03/17/2017  . Low back pain with sciatica 04/08/2015  . Colon polyp 09/10/2013   PCP:  [name], MD Pharmacy:   [facility] [place], [address] AT [address] [facility] [place] [address] Phone: [phone] Fax: [phone]     Social Determinants of Health (SDOH) Interventions    Readmission Risk Interventions No flowsheet data found.

## 2019-03-08 NOTE — Progress Notes (Signed)
Physical Therapy Treatment Patient Details Name: Lindsey Freeman MRN: [id number] DOB: 27-Mar-1946 Today's Date: 03/08/2019    History of Present Illness Pt. is a 73 y/o F s/p THA; PMH is unremarkable. Posterior hip precautions apply.    PT Comments    Upon entry, pt is in bed and states that her pain is a 2-3/10 on NPS. She reports that her BP was lower than usual today. She was able to perform supine therex with supervision provided by PT. Bed mobility tasks performed with min assist to monitor surgical leg. Sit<> stand transfers performed with CGA and verbal cueing to prevent excess hip flexion. At EOB, pt stated that she was a bit lightheaded and BP was measured at 113/64. Assessed again in standing prior to amb; finding was 131/72, which she reports is close to her normal. Pt was able to amb 60 ft this morning with youth RW. CGA required for safety and verbal cueing required to prevent excess toeing in during turns. Her pain at the end of the session was stated to be 3-4/10 on NPS. OT present at the end of the session; pt was left in chair with alarm not set per OT tx. Pt will continue to benefit from skilled PT to address impairments in strength, ROM, and pain management.   Follow Up Recommendations  Home health PT     Equipment Recommendations  None recommended by PT    Recommendations for Other Services       Precautions / Restrictions Precautions Precautions: Posterior Hip Precaution Booklet Issued: Yes (comment) Restrictions Weight Bearing Restrictions: Yes LLE Weight Bearing: Weight bearing as tolerated    Mobility  Bed Mobility Overal bed mobility: Needs Assistance Bed Mobility: Supine to Sit     Supine to sit: Min assist     General bed mobility comments: Pt. still requires min assist for maneuvering surgical leg and cueing to prevent excess hip flexion  Transfers Overall transfer level: Needs assistance Equipment used: Rolling walker (2 wheeled) Transfers: Sit  to/from Stand Sit to Stand: Min guard            Ambulation/Gait Ambulation/Gait assistance: Min guard Gait Distance (Feet): 60 Feet Assistive device: Rolling walker (2 wheeled) Gait Pattern/deviations: Step-through pattern;Decreased stride length;Antalgic     General Gait Details: Pt amb 60 ft with step-through pattern. She moved slowly with slightly antalgic gait 2/2 L THA. Pt still requires cueing with turns to prevent excess toeing in.   Stairs             Wheelchair Mobility    Modified Rankin (Stroke Patients Only)       Balance                                            Cognition Arousal/Alertness: Awake/alert Behavior During Therapy: WFL for tasks assessed/performed Overall Cognitive Status: Within Functional Limits for tasks assessed                                        Exercises Total Joint Exercises Ankle Circles/Pumps: AROM;Both;Strengthening;Supine(12 reps) Quad Sets: AROM;Left;Supine(12 reps) Gluteal Sets: AROM;Supine;Strengthening;Both(12 reps) Hip ABduction/ADduction: AROM;Supine;Strengthening;Left(12 reps)    General Comments General comments (skin integrity, edema, etc.): BP and related sx monitored throughout session, as pt. reported her BP was low when assessed  by nurse.       Pertinent Vitals/Pain Pain Assessment: 0-10 Pain Score: 2  Pain Location: L hip Pain Descriptors / Indicators: Discomfort;Sore;Aching Pain Intervention(s): Limited activity within patient's tolerance;Monitored during session;Ice applied    Home Living Family/patient expects to be discharged to:: Private residence Living Arrangements: Spouse/significant other Available Help at Discharge: Family Type of Home: House Home Access: Stairs to enter Entrance Stairs-Rails: Right Home Layout: One level Home Equipment: Environmental consultant - 2 wheels;Cane - single point;Bedside commode;Grab bars - toilet;Shower seat - built Photographer      Prior Function Level of Independence: Independent with assistive device(s)      Comments: Pt used SPC to amb prior to surgery   PT Goals (current goals can now be found in the care plan section) Acute Rehab PT Goals Patient Stated Goal: to return home PT Goal Formulation: With patient Potential to Achieve Goals: Good Progress towards PT goals: Progressing toward goals    Frequency    BID      PT Plan Current plan remains appropriate    Co-evaluation              AM-PAC PT "6 Clicks" Mobility   Outcome Measure  Help needed turning from your back to your side while in a flat bed without using bedrails?: None Help needed moving from lying on your back to sitting on the side of a flat bed without using bedrails?: A Little Help needed moving to and from a bed to a chair (including a wheelchair)?: A Little Help needed standing up from a chair using your arms (e.g., wheelchair or bedside chair)?: A Little Help needed to walk in hospital room?: A Little Help needed climbing 3-5 steps with a railing? : A Little 6 Click Score: 19    End of Session Equipment Utilized During Treatment: Gait belt Activity Tolerance: Patient tolerated treatment well Patient left: in chair;with call bell/phone within reach;Other (comment)(OT entered at end of session; chair alarm off per request) Pt. Left with pillows between bilateral knees for posterior hip precautions and for floating bilateral heels.   PT Visit Diagnosis: Other abnormalities of gait and mobility (R26.89);Muscle weakness (generalized) (M62.81)     Time: [id number] PT Time Calculation (min) (ACUTE ONLY): 34 min  Charges:                        Dixie Dials, SPT 03/08/2019, 11:16 AM

## 2019-03-08 NOTE — Progress Notes (Signed)
  Subjective: 1 Day Post-Op Procedure(s) (LRB): TOTAL HIP ARTHROPLASTY (Left) Patient reports pain as mild.   Patient seen in rounds with Dr. Marry Guan. Patient is well, and has had no acute complaints or problems Plan is to go Home after hospital stay. Negative for chest pain and shortness of breath Fever: no Gastrointestinal: Negative for nausea and vomiting  Objective: Vital signs in last 24 hours: Temp:  [96.9 F (36.1 C)-98.4 F (36.9 C)] 97.9 F (36.6 C) (09/17 0609) Pulse Rate:  [71-93] 72 (09/17 0609) Resp:  [10-31] 16 (09/17 0609) BP: (104-157)/(62-94) 104/62 (09/17 0609) SpO2:  [96 %-100 %] 99 % (09/17 0609) Weight:  [71 kg] 71 kg (09/16 0720)  Intake/Output from previous day:  Intake/Output Summary (Last 24 hours) at 03/08/2019 [id number] Last data filed at 03/08/2019 0544 Gross per 24 hour  Intake 3031.66 ml  Output 1840 ml  Net 1191.66 ml    Intake/Output this shift: Total I/O In: 1333.3 [I.V.:1033.3; IV Piggyback:300] Out: [id number] [Urine:1075; Drains:200]  Labs: No results for input(s): HGB in the last 72 hours. No results for input(s): WBC, RBC, HCT, PLT in the last 72 hours. No results for input(s): NA, K, CL, CO2, BUN, CREATININE, GLUCOSE, CALCIUM in the last 72 hours. No results for input(s): LABPT, INR in the last 72 hours.   EXAM General - Patient is Alert and Oriented Extremity - Neurovascular intact Sensation intact distally Dorsiflexion/Plantar flexion intact Compartment soft Dressing/Incision - clean, dry, with the Hemovac intact Motor Function - intact, moving foot and toes well on exam.   History reviewed. No pertinent past medical history.  Assessment/Plan: 1 Day Post-Op Procedure(s) (LRB): TOTAL HIP ARTHROPLASTY (Left) Active Problems:   H/O total hip arthroplasty  Estimated body mass index is 29.58 kg/m as calculated from the following:   Height as of this encounter: 5\' 1"  (1.549 m).   Weight as of this encounter: 71 kg. Advance diet Up  with therapy D/C IV fluids Discharge home with home health tomorrow Bowel management.  DVT Prophylaxis - Lovenox, Foot Pumps and TED hose Weight-Bearing as tolerated to left leg  Reche Dixon, PA-C Orthopaedic Surgery 03/08/2019, 6:32 AM

## 2019-03-08 NOTE — Anesthesia Postprocedure Evaluation (Signed)
Anesthesia Post Note  Patient: Lindsey Freeman  Procedure(s) Performed: TOTAL HIP ARTHROPLASTY (Left Hip)  Patient location during evaluation: Nursing Unit Anesthesia Type: Spinal Level of consciousness: awake, awake and alert and oriented Pain management: pain level controlled Vital Signs Assessment: post-procedure vital signs reviewed and stable Respiratory status: spontaneous breathing, nonlabored ventilation and respiratory function stable Cardiovascular status: blood pressure returned to baseline and stable Postop Assessment: no headache and no backache Anesthetic complications: no     Last Vitals:  Vitals:   03/07/19 2325 03/08/19 0609  BP: 113/64 104/62  Pulse: 82 72  Resp: 18 16  Temp: 36.5 C 36.6 C  SpO2: 96% 99%    Last Pain:  Vitals:   03/08/19 0609  TempSrc: Oral  PainSc:                  Johnna Acosta

## 2019-03-08 NOTE — Progress Notes (Signed)
Physical Therapy Treatment Patient Details Name: Lindsey Freeman MRN: [id number] DOB: 05/12/1946 Today's Date: 03/08/2019    History of Present Illness Pt. is a 73 y/o F s/p THA; PMH is unremarkable. Posterior hip precautions apply.    PT Comments    Upon entry, pt is seated in recliner with husband in room with pain 2/10 on NPS. Pillows between knees for posterior precautions and heel floating had been removed. Pt performed therapeutic exercise with supervision; SLR required min assist from PT. Pt was able to walk 140 ft during session. Speed appeared increased compared to earlier appointment. Antalgic gait with decreased stride length still present 2/2 pain s/p L THA. Pt was mindful of precauctions throughout amb. Pt practiced amb with walker brought in from husband with step through pattern. She states that the youth RW is more comfortable for her, as it requires her to work less hard. Pt positioned in bed at end of session with pillows to preserve posterior precautions reapplied. Pt is progressing well as evidenced by increased walking distance and ability to progress therex. Pt will continue to benefit from skilled PT to address reduced strength and ROM as well as pain management.    Follow Up Recommendations  Home health PT     Equipment Recommendations  Other (comment)(Youth RW)    Recommendations for Other Services       Precautions / Restrictions Precautions Precautions: Posterior Hip Precaution Booklet Issued: Yes (comment) Restrictions Weight Bearing Restrictions: Yes LLE Weight Bearing: Weight bearing as tolerated    Mobility  Bed Mobility Overal bed mobility: Needs Assistance Bed Mobility: Sit to Supine       Sit to supine: Min assist   General bed mobility comments: Min assist required to bring surgical leg back to the bed following session   Transfers Overall transfer level: Needs assistance Equipment used: Rolling walker (2 wheeled) Transfers: Sit to/from  Stand Sit to Stand: Min guard            Ambulation/Gait Ambulation/Gait assistance: Min guard Gait Distance (Feet): 140 Feet Assistive device: Rolling walker (2 wheeled) Gait Pattern/deviations: Step-through pattern;Decreased stride length;Antalgic     General Gait Details: Pt amb 140 ft with step through pattern. Speed appered increased compared to am appointment. Antalgic gait with decreased stride length still present 2/2 pain s/p L THA. Pt was mindful of precauctions throughout amb.   Stairs             Wheelchair Mobility    Modified Rankin (Stroke Patients Only)       Balance                                            Cognition Arousal/Alertness: Awake/alert Behavior During Therapy: WFL for tasks assessed/performed Overall Cognitive Status: Within Functional Limits for tasks assessed                                        Exercises Total Joint Exercises Ankle Circles/Pumps: AROM;Strengthening;Both;Seated(12 reps; reclined in chair) Quad Sets: AROM;Strengthening;Both;Seated(12 reps; reclined in chair) Gluteal Sets: AROM;Strengthening;Both;Seated(12 reps; reclined in chair) Short Arc Quad: AROM;Strengthening;Both(12 reps; semi-supine in bed) Heel Slides: AROM;Strengthening;Left(12 reps; semi-supine in bed) Hip ABduction/ADduction: AROM;Strengthening;Left;Seated(12 reps; reclined in chair) Straight Leg Raises: AROM;Strengthening;Left;Seated(12 reps; min assist req'd; reclined in chair)  General Comments General comments (skin integrity, edema, etc.): Pt reports that she has not had dizziness or lightheadedness following morning appt.      Pertinent Vitals/Pain Pain Assessment: 0-10 Pain Score: 2  Pain Location: L hip Pain Descriptors / Indicators: Discomfort;Sore;Aching Pain Intervention(s): Limited activity within patient's tolerance;Monitored during session;Repositioned    Home Living                       Prior Function            PT Goals (current goals can now be found in the care plan section) Acute Rehab PT Goals Patient Stated Goal: to return home PT Goal Formulation: With patient Potential to Achieve Goals: Good Progress towards PT goals: Progressing toward goals    Frequency    BID      PT Plan Current plan remains appropriate    Co-evaluation              AM-PAC PT "6 Clicks" Mobility   Outcome Measure  Help needed turning from your back to your side while in a flat bed without using bedrails?: None Help needed moving from lying on your back to sitting on the side of a flat bed without using bedrails?: A Little Help needed moving to and from a bed to a chair (including a wheelchair)?: A Little Help needed standing up from a chair using your arms (e.g., wheelchair or bedside chair)?: A Little Help needed to walk in hospital room?: A Little Help needed climbing 3-5 steps with a railing? : A Little 6 Click Score: 19    End of Session Equipment Utilized During Treatment: Gait belt Activity Tolerance: Patient tolerated treatment well Patient left: in bed;with call bell/phone within reach;with bed alarm set;with family/visitor present;with SCD's reapplied(pillows b/w knees for posterior precautions and heel float) Nurse Communication: Other (comment)(Check hemovac and replace ice) PT Visit Diagnosis: Other abnormalities of gait and mobility (R26.89);Muscle weakness (generalized) (M62.81)     Time: 1334-1400 PT Time Calculation (min) (ACUTE ONLY): 26 min  Charges:                        Dixie Dials, SPT 03/08/2019, 2:56 PM

## 2019-03-09 NOTE — Progress Notes (Signed)
Physical Therapy Treatment Patient Details Name: Lindsey Freeman MRN: [id number] DOB: April 07, 1946 Today's Date: 03/09/2019    History of Present Illness Pt. is a 73 y/o F s/p THA; PMH is unremarkable. Posterior hip precautions apply.    PT Comments    Upon entry, pt is standing with youth RW about to use the restroom, asks to start when she gets out. She reports that she has minimal pain today and is eager to perform stairs. Pt received walker this morning; PT adjusted height of walker to fit patient appropriately. Pt was able to stand at this time without UE support with CGA provided by PT. Session began with 50 ft walk to therapy gym to work on stairs. Pt began practicing stairs using both rails to increase comfort. Pt then performed with R rail and handheld assist from PT, followed by with R rail only using one UE for support. Pt also practiced single-landing stair to mimic home environment with bilateral UE's supported by walker two times. Pt amb 50 ft to gym prior to stair training and was able to walk 225 ft around nurse station to return to her room. Pt amb with step through gait pattern; speed appeared [facility] for household and community amb. She moves with decreased stride length 2/2 discomfort s/p L THA. CGA was applied by PT throughout gait and stair practice and RW was utilized by pt. Stand--> sit transfer at end of session was performed with UE supported by RW; CGA provided for safety. Following session, pt reports that her pain is unchanged; only discomfort related to pain from surgery. Pt states that she has worked with OT this morning and was familiarized with car transfers. Pt will continue to benefit from skilled PT to address deficits in strength, balance, and ROM.    Follow Up Recommendations  Home health PT     Equipment Recommendations  Other (comment)    Recommendations for Other Services       Precautions / Restrictions Precautions Precautions: Posterior Hip Precaution  Booklet Issued: Yes (comment) Precaution Comments: Pt is able to repeat precautions Restrictions Weight Bearing Restrictions: Yes LLE Weight Bearing: Weight bearing as tolerated    Mobility  Bed Mobility               General bed mobility comments: Pt standing at beginning of session and returned to recliner at end of session; bed mobility not a part of treatment  Transfers Overall transfer level: Needs assistance Equipment used: Rolling walker (2 wheeled) Transfers: Sit to/from Stand Sit to Stand: Min guard         General transfer comment: Min guard provided for safety; cueing not required, as she is familiar with precautions. I am, however, confident that she could perform the transfer without PT present.  Ambulation/Gait Ambulation/Gait assistance: Min guard Gait Distance (Feet): (50 ft; 225 ft) Assistive device: Rolling walker (2 wheeled)(Youth) Gait Pattern/deviations: Step-through pattern;Decreased stride length     General Gait Details: Pt amb with step through gait pattern; speed appeared [facility] for household and commuunity amb. She moves with decreased stride length 2/2 discomfort s/p L THA.   Stairs Stairs: Yes Stairs assistance: Min guard Stair Management: One rail Right;Two rails Number of Stairs: 4 General stair comments: Pt began practicing stairs using both rails to increase comfort. Pt then performed with R rail and handheld assist from PT, followed by with R rail only using one UE for support. Pt also practiced single-landing step to mimic home environment with bilateral UE's  supported by walker.    Wheelchair Mobility    Modified Rankin (Stroke Patients Only)       Balance Overall balance assessment: Needs assistance Sitting-balance support: Feet supported Sitting balance-Leahy Scale: Good     Standing balance support: No upper extremity supported Standing balance-Leahy Scale: Good Standing balance comment: Pt supported by PT in standing;  able to maintain upright posture without UE support                            Cognition Arousal/Alertness: Awake/alert Behavior During Therapy: WFL for tasks assessed/performed Overall Cognitive Status: Within Functional Limits for tasks assessed                                        Exercises Other Exercises Other Exercises: Pt amb 50 ft to reach gym and 225 ft following practice with stairs. Other Exercises: Stairs practice to mimic home environment    General Comments General comments (skin integrity, edema, etc.): Pt received youth RW this morning. Walker was adjusted to fit patient appropriately.      Pertinent Vitals/Pain Pain Assessment: Faces Faces Pain Scale: Hurts a little bit Pain Location: L hip Pain Descriptors / Indicators: Discomfort Pain Intervention(s): Monitored during session;Repositioned    Home Living                      Prior Function            PT Goals (current goals can now be found in the care plan section) Acute Rehab PT Goals Patient Stated Goal: to return home PT Goal Formulation: With patient Potential to Achieve Goals: Good Progress towards PT goals: Progressing toward goals    Frequency    BID      PT Plan Current plan remains appropriate    Co-evaluation              AM-PAC PT "6 Clicks" Mobility   Outcome Measure  Help needed turning from your back to your side while in a flat bed without using bedrails?: None Help needed moving from lying on your back to sitting on the side of a flat bed without using bedrails?: A Little Help needed moving to and from a bed to a chair (including a wheelchair)?: A Little Help needed standing up from a chair using your arms (e.g., wheelchair or bedside chair)?: A Little Help needed to walk in hospital room?: A Little Help needed climbing 3-5 steps with a railing? : A Little 6 Click Score: 19    End of Session Equipment Utilized During  Treatment: Gait belt Activity Tolerance: Patient tolerated treatment well Patient left: in chair;with call bell/phone within reach;with chair alarm set;with family/visitor present;with SCD's reapplied   PT Visit Diagnosis: Other abnormalities of gait and mobility (R26.89);Muscle weakness (generalized) (M62.81)     Time: [id number] PT Time Calculation (min) (ACUTE ONLY): 25 min  Charges:                       Dixie Dials, SPT 03/09/2019, 10:37 AM

## 2019-03-09 NOTE — Progress Notes (Signed)
Occupational Therapy Treatment Patient Details Name: Lindsey Freeman MRN: [id number] DOB: [birth year] Today's Date: 03/09/2019    History of present illness Pt. is a 73 y/o F s/p THA; PMH is unremarkable. Posterior hip precautions apply.   OT comments  Lindsey Freeman was seen for OT treatment on this date. Upon arrival to room pt awake/alert exiting room bathroom with husband present. Pt A&O x 4 reporting minimal pain. Pt agreeable to OT tx. Pt provided with reinforcement on prior education in LB dressing techniques using AE. Pt able to return demonstrate use of long handle reacher to don underwear this date. Pt required supervision for safety and min VC's for technique and adherence to posterior THP's. Afterward, pt was able to stand at sink to complete oral care with supervision for safety. Pt intermittently walking with and without AD this date. Pt was stable and demonstrated good overall safety awareness during functional mobility and transfers. Pt caregiver present and participating throughout OT session, and return verbalizes understanding of strategies for mgt of compression stockings and hip precautions. Pt able to return verbalize 3/3 posterior hip precautions independently at end of OT session. Pt making good progress toward goals and continues to benefit from skilled OT services to maximize return to PLOF and minimize risk of future falls, injury, caregiver burden, and readmission. Will continue to follow POC as written. Discharge recommendation remains appropriate.    Follow Up Recommendations  Home health OT    Equipment Recommendations       Recommendations for Other Services      Precautions / Restrictions Precautions Precautions: Posterior Hip Precaution Booklet Issued: Yes (comment) Precaution Comments: Pt is able to repeat precautions Restrictions Weight Bearing Restrictions: Yes LLE Weight Bearing: Weight bearing as tolerated       Mobility Bed Mobility Overal bed mobility:  Needs Assistance Bed Mobility: Sit to Supine     Supine to sit: Min assist Sit to supine: Min assist   General bed mobility comments: Pt standing at beginning and returned to recliner at end of session; bed mobility not a part of treatment.  Transfers Overall transfer level: Needs assistance Equipment used: Rolling walker (2 wheeled) Transfers: Sit to/from Stand Sit to Stand: Supervision         General transfer comment: Pt supervision level for all transfers and ambulation this date. Doing very well with good stability and overall safety awareness.    Balance Overall balance assessment: Needs assistance Sitting-balance support: Feet supported Sitting balance-Leahy Scale: Normal Sitting balance - Comments: Steady sitting, reaching within/outside of BOS. Good static and dynamic balance during functional activites.   Standing balance support: No upper extremity supported;During functional activity Standing balance-Leahy Scale: Good Standing balance comment: PT steady standing at sink to complete oral care with no UE suport. Pt also able to stand without AE to pull up underwear on this date. No LOB noted.                           ADL either performed or assessed with clinical judgement   ADL Overall ADL's : Needs assistance/impaired                                       General ADL Comments: Pt was able to don underwear using LHR while mainaining posterior THPS with supervision for safety and min cueing for technique. Pt Able to  ambulate to room toilet with 2WW with supervision assist and completing peri-care independently this date. Pt also stood at sink to complete oral care with supervision for safety. Pt overall doing very well with maintaining hip precautions and ADL mgt.     Vision Baseline Vision/History: Wears glasses Wears Glasses: At all times Patient Visual Report: No change from baseline     Perception     Praxis      Cognition  Arousal/Alertness: Awake/alert Behavior During Therapy: WFL for tasks assessed/performed Overall Cognitive Status: Within Functional Limits for tasks assessed                                          Exercises Exercises: Other exercises Other Exercises Other Exercises: Pt amb 50 ft to reach gym and 225 ft following practice with stairs. Other Exercises: Stairs practice to mimic home environment Other Exercises: Pt educated in falls prevention strategies, safe use of AE for LB adl mgt, posterior THP's, and pet care mgt strategies. Good recall and carryover from past OT session.   Shoulder Instructions       General Comments Pt received youth RW this morning. Walker was adjusted to fit patient appropriately.    Pertinent Vitals/ Pain       Pain Assessment: 0-10 Pain Score: 3  Faces Pain Scale: Hurts a little bit Pain Location: L hip - pt states "in the muscles" Pain Descriptors / Indicators: Discomfort Pain Intervention(s): Repositioned;Monitored during session  Home Living                                          Prior Functioning/Environment              Frequency  Min 2X/week        Progress Toward Goals  OT Goals(current goals can now be found in the care plan section)  Progress towards OT goals: Progressing toward goals  Acute Rehab OT Goals Patient Stated Goal: to return home OT Goal Formulation: With patient Time For Goal Achievement: 03/22/19 Potential to Achieve Goals: Good  Plan Discharge plan remains appropriate;Frequency remains appropriate    Co-evaluation                 AM-PAC OT "6 Clicks" Daily Activity     Outcome Measure   Help from another person eating meals?: None Help from another person taking care of personal grooming?: None Help from another person toileting, which includes using toliet, bedpan, or urinal?: A Little Help from another person bathing (including washing, rinsing, drying)?: A  Little Help from another person to put on and taking off regular upper body clothing?: None Help from another person to put on and taking off regular lower body clothing?: A Little 6 Click Score: 21    End of Session Equipment Utilized During Treatment: Gait belt;Rolling walker  OT Visit Diagnosis: Other abnormalities of gait and mobility (R26.89);Pain Pain - Right/Left: Left Pain - part of body: Hip   Activity Tolerance Patient tolerated treatment well   Patient Left in chair;with call bell/phone within reach;with family/visitor present   Nurse Communication          Time: [id number] OT Time Calculation (min): 25 min  Charges: OT General Charges $OT Visit: 1 Visit OT Treatments $Self Care/Home Management : 23-37  mins  Shara Blazing, M.S., OTR/L Ascom: [phone] 03/09/19, 11:37 AM

## 2019-03-09 NOTE — Progress Notes (Signed)
Lovenox education given to patient and spouse. Demonstration given on how to admin lovenox with teach back. Spouse demonstrates correctly how to admin lovenox.

## 2019-03-09 NOTE — Progress Notes (Signed)
  Subjective: 2 Days Post-Op Procedure(s) (LRB): TOTAL HIP ARTHROPLASTY (Left) Patient reports pain as mild.   Patient seen in rounds with Dr. Marry Guan. Patient is well, and has had no acute complaints or problems Plan is to go Home after hospital stay. Negative for chest pain and shortness of breath Fever: no Gastrointestinal: Negative for nausea and vomiting  Objective: Vital signs in last 24 hours: Temp:  [97.5 F (36.4 C)-98.3 F (36.8 C)] 97.8 F (36.6 C) (09/17 2259) Pulse Rate:  [74-86] 77 (09/17 2259) Resp:  [14-15] 14 (09/17 2259) BP: (111-148)/(55-72) 148/72 (09/17 2259) SpO2:  [97 %-100 %] 97 % (09/17 2259)  Intake/Output from previous day:  Intake/Output Summary (Last 24 hours) at 03/09/2019 0649 Last data filed at 03/08/2019 2300 Gross per 24 hour  Intake 360 ml  Output 10 ml  Net 350 ml    Intake/Output this shift: Total I/O In: -  Out: 10 [Drains:10]  Labs: No results for input(s): HGB in the last 72 hours. No results for input(s): WBC, RBC, HCT, PLT in the last 72 hours. No results for input(s): NA, K, CL, CO2, BUN, CREATININE, GLUCOSE, CALCIUM in the last 72 hours. No results for input(s): LABPT, INR in the last 72 hours.   EXAM General - Patient is Alert and Oriented Extremity - Neurovascular intact Sensation intact distally Dorsiflexion/Plantar flexion intact Compartment soft Dressing/Incision - clean, dry, with the Hemovac removed with no complication.  The Hemovac tubing was intact on removal. Motor Function - intact, moving foot and toes well on exam.   History reviewed. No pertinent past medical history.  Assessment/Plan: 2 Days Post-Op Procedure(s) (LRB): TOTAL HIP ARTHROPLASTY (Left) Active Problems:   H/O total hip arthroplasty  Estimated body mass index is 29.58 kg/m as calculated from the following:   Height as of this encounter: 5\' 1"  (1.549 m).   Weight as of this encounter: 71 kg. Advance diet Up with therapy D/C IV  fluids Discharge home with home health today The patient has had a bowel movement.  DVT Prophylaxis - Lovenox, Foot Pumps and TED hose Weight-Bearing as tolerated to left leg  Reche Dixon, PA-C Orthopaedic Surgery 03/09/2019, 6:49 AM

## 2019-03-09 NOTE — Progress Notes (Signed)
Discharge instructions given to patient and family. IV removed.

## 2019-03-09 NOTE — TOC Transition Note (Signed)
Transition of Care [facility]) - CM/SW Discharge Note   Patient Details  Name: Lindsey Freeman MRN: [id number] Date of Birth: [birth year]  [facility] of [facility] CM/SW Contact:  Torri Langston, Lenice Llamas Phone Number: [phone]  03/09/2019, 9:22 AM   Clinical Narrative: Per PT patient requires a youth rolling walker. Clinical Education officer, museum (CSW) delivered youth rolling walker to patient's room today. Gennie Alma DME agency representative is aware of above. Clinical Education officer, museum (CSW) notified Helene Kelp [facility] home [facility] representative that patient will D/C home today. Per Helene Kelp patient will be seen by [facility] office because patient lives in [place]. Patient is aware of above. Patient notified of price of Lovenox $13. Patient reported no other needs or concerns. Please reconsult if future social work needs arise. CSW signing off.       Final next level of care: [place] Barriers to Discharge: Barriers Resolved   Patient Goals and CMS Choice Patient states their goals for this hospitalization and ongoing recovery are:: To go home. CMS Medicare.gov Compare Post Acute Care list provided to:: Patient Choice offered to / list presented to : Patient  Discharge Placement                       Discharge Plan and Services In-house Referral: Clinical Social Work   Post Acute Care Choice: Home Health          DME Arranged: Gilford Rile youth DME Agency: [employer] Date DME Agency Contacted: 03/09/19 Time DME Agency Contacted: [phone] Representative spoke with at DME Agency: Jeneen Rinks HH Arranged: PT, OT [place] Agency: [facility] (formerly [employer]) Date [place]: 03/09/19 Time [place]: [phone] Representative spoke with at [place]: [place] Interventions     Readmission Risk Interventions No flowsheet data found.

## 2019-03-09 NOTE — Discharge Summary (Signed)
Physician Discharge Summary  Subjective: 2 Days Post-Op Procedure(s) (LRB): TOTAL HIP ARTHROPLASTY (Left) Patient reports pain as mild.   Patient seen in rounds with Dr. Marry Guan. Patient is well, and has had no acute complaints or problems Patient is ready to go home with home health physical therapy  Physician Discharge Summary  Patient ID: Lindsey Freeman MRN: [id number] DOB/AGE: Nov 19, 1945 73 y.o.  Admit date: 03/07/2019 Discharge date: 03/09/2019  Admission Diagnoses:  Discharge Diagnoses:  Active Problems:   H/O total hip arthroplasty   Discharged Condition: fair  Hospital Course: The patient is postop day 2 from a left total hip replacement.  She is done very well since surgery.  She has ambulated 140 feet with physical therapy.  She is transitioning well.  She had a bowel movement yesterday.  Her vitals have remained stable with only slight hypertension.  Her Hemovac was removed this morning.  She is ready to go home today.  Treatments: surgery:   Left total hip arthroplasty  SURGEON:  Marciano Sequin. M.D.  ASSISTANT: Vance Peper, PA (present and scrubbed throughout the case, critical for assistance with exposure, retraction, instrumentation, and closure)  ANESTHESIA: spinal  ESTIMATED BLOOD LOSS: 75 mL  FLUIDS REPLACED: 800 mL of crystalloid  DRAINS: 2 medium drains to a Hemovac reservoir  IMPLANTS UTILIZED: DePuy 12 mm small stature AML femoral stem, 48 mm OD Pinnacle 100 acetabular component, neutral Pinnacle Altrx polyethylene insert, and a 32 mm CoCr +1 mm hip ball   Discharge Exam: Blood pressure (!) 148/72, pulse 77, temperature 97.8 F (36.6 C), temperature source Oral, resp. rate 14, height 5\' 1"  (1.549 m), weight 71 kg, SpO2 97 %.   Disposition: Discharge disposition: 01-Home or Self Care        Allergies as of 03/09/2019   No Known Allergies     Medication List    STOP taking these medications   meloxicam 15 MG tablet Commonly  known as: MOBIC     TAKE these medications   celecoxib 200 MG capsule Commonly known as: CELEBREX Take 1 capsule (200 mg total) by mouth 2 (two) times daily.   cetirizine 10 MG tablet Commonly known as: ZYRTEC Take 10 mg by mouth daily.   desoximetasone 0.25 % cream Commonly known as: TOPICORT Apply 1 application topically 2 (two) times daily as needed. eczema   docusate sodium 100 MG capsule Commonly known as: COLACE Take 100 mg by mouth daily as needed for mild constipation.   enoxaparin 40 MG/0.4ML injection Commonly known as: LOVENOX Inject 0.4 mLs (40 mg total) into the skin daily for 14 days.   Flaxseed Oil 1000 MG Caps Take 1,000 mg by mouth daily.   Flonase Sensimist 27.5 MCG/SPRAY nasal spray Generic drug: fluticasone Place 1 spray into the nose every evening.   oxyCODONE 5 MG immediate release tablet Commonly known as: Oxy IR/ROXICODONE Take 1 tablet (5 mg total) by mouth every 4 (four) hours as needed for moderate pain (pain score 4-6).   oxymetazoline 0.05 % nasal spray Commonly known as: AFRIN Place 1 spray into both nostrils 2 (two) times daily.   traMADol 50 MG tablet Commonly known as: ULTRAM Take 1-2 tablets (50-100 mg total) by mouth every 4 (four) hours as needed for moderate pain. What changed:   how much to take  when to take this  reasons to take this            Durable Medical Equipment  (From admission, onward)  Start     Ordered   03/07/19 1327  DME Walker rolling  Once    Question:  Patient needs a walker to treat with the following condition  Answer:  S/P total hip arthroplasty   03/07/19 1326   03/07/19 1327  DME Bedside commode  Once    Question:  Patient needs a bedside commode to treat with the following condition  Answer:  S/P total hip arthroplasty   03/07/19 1326         Follow-up Information    Dereck Leep, MD On 04/19/2019.   Specialty: Orthopedic Surgery Why: at 9:45am Contact information: [place] [address] [phone]           Signed: Prescott Parma, Carel Schnee 03/09/2019, 6:51 AM   Objective: Vital signs in last 24 hours: Temp:  [97.5 F (36.4 C)-98.3 F (36.8 C)] 97.8 F (36.6 C) (09/17 2259) Pulse Rate:  [74-86] 77 (09/17 2259) Resp:  [14-15] 14 (09/17 2259) BP: (111-148)/(55-72) 148/72 (09/17 2259) SpO2:  [97 %-100 %] 97 % (09/17 2259)  Intake/Output from previous day:  Intake/Output Summary (Last 24 hours) at 03/09/2019 0651 Last data filed at 03/08/2019 2300 Gross per 24 hour  Intake 360 ml  Output 10 ml  Net 350 ml    Intake/Output this shift: Total I/O In: -  Out: 10 [Drains:10]  Labs: No results for input(s): HGB in the last 72 hours. No results for input(s): WBC, RBC, HCT, PLT in the last 72 hours. No results for input(s): NA, K, CL, CO2, BUN, CREATININE, GLUCOSE, CALCIUM in the last 72 hours. No results for input(s): LABPT, INR in the last 72 hours.  EXAM: General - Patient is Alert and Oriented Extremity - Neurovascular intact Sensation intact distally Dorsiflexion/Plantar flexion intact Compartment soft Incision - clean, dry, with the Hemovac removed Motor Function -plantarflexion and dorsiflexion are intact.  Assessment/Plan: 2 Days Post-Op Procedure(s) (LRB): TOTAL HIP ARTHROPLASTY (Left) Procedure(s) (LRB): TOTAL HIP ARTHROPLASTY (Left) History reviewed. No pertinent past medical history. Active Problems:   H/O total hip arthroplasty  Estimated body mass index is 29.58 kg/m as calculated from the following:   Height as of this encounter: 5\' 1"  (1.549 m).   Weight as of this encounter: 71 kg. Advance diet Up with therapy D/C IV fluids Discharge home with home health Diet - Regular diet Follow up - in 6 weeks Activity - WBAT Disposition - Home Condition Upon Discharge - Good DVT Prophylaxis - Lovenox and TED hose  Reche Dixon, PA-C Orthopaedic Surgery 03/09/2019, 6:51 AM

## 2019-03-23 LAB — SURGICAL PATHOLOGY

## 2020-08-18 ENCOUNTER — Other Ambulatory Visit: Payer: Self-pay | Admitting: Orthopedic Surgery

## 2020-08-18 DIAGNOSIS — M4807 Spinal stenosis, lumbosacral region: Secondary | ICD-10-CM

## 2020-08-18 DIAGNOSIS — M5442 Lumbago with sciatica, left side: Secondary | ICD-10-CM

## 2020-08-18 DIAGNOSIS — G8929 Other chronic pain: Secondary | ICD-10-CM

## 2020-08-18 DIAGNOSIS — M48061 Spinal stenosis, lumbar region without neurogenic claudication: Secondary | ICD-10-CM

## 2020-09-01 ENCOUNTER — Other Ambulatory Visit: Payer: Self-pay

## 2020-09-01 ENCOUNTER — Ambulatory Visit
Admission: RE | Admit: 2020-09-01 | Discharge: 2020-09-01 | Disposition: A | Discharge status: Home / Self Care | Payer: Medicare Other | Source: Ambulatory Visit | Attending: Orthopedic Surgery | Admitting: Orthopedic Surgery

## 2020-09-01 DIAGNOSIS — M4807 Spinal stenosis, lumbosacral region: Secondary | ICD-10-CM | POA: Insufficient documentation

## 2020-09-01 DIAGNOSIS — G8929 Other chronic pain: Secondary | ICD-10-CM | POA: Insufficient documentation

## 2020-09-01 DIAGNOSIS — M5442 Lumbago with sciatica, left side: Secondary | ICD-10-CM | POA: Diagnosis present

## 2020-09-01 DIAGNOSIS — M5441 Lumbago with sciatica, right side: Secondary | ICD-10-CM | POA: Diagnosis present

## 2020-09-01 DIAGNOSIS — M48061 Spinal stenosis, lumbar region without neurogenic claudication: Secondary | ICD-10-CM | POA: Diagnosis present

## 2020-09-02 DIAGNOSIS — R7989 Other specified abnormal findings of blood chemistry: Secondary | ICD-10-CM | POA: Insufficient documentation

## 2020-11-20 ENCOUNTER — Other Ambulatory Visit: Payer: Self-pay

## 2020-11-20 ENCOUNTER — Encounter: Payer: Self-pay | Admitting: Physical Therapy

## 2020-11-20 ENCOUNTER — Ambulatory Visit: Payer: Medicare Other | Attending: Family Medicine | Admitting: Physical Therapy

## 2020-11-20 DIAGNOSIS — G8929 Other chronic pain: Secondary | ICD-10-CM | POA: Insufficient documentation

## 2020-11-20 DIAGNOSIS — M545 Low back pain, unspecified: Secondary | ICD-10-CM

## 2020-11-20 DIAGNOSIS — R293 Abnormal posture: Secondary | ICD-10-CM

## 2020-11-20 NOTE — Therapy (Signed)
[facility] [address] [place], [place], [address] Phone: [phone]   Fax:  [phone]  Physical Therapy Evaluation  Patient Details  Name: Lindsey TOVEY MRN: [id number] Date of Birth: 1946/05/01 Referring Provider (PT): Meeler, Whitney   Encounter Date: 11/20/2020   PT End of Session - 11/20/20 1852    Visit Number 1    Number of Visits 8    Date for PT Re-Evaluation 01/15/21    PT Start Time 1100    PT Stop Time 1145    PT Time Calculation (min) 45 min    Activity Tolerance Patient tolerated treatment well    Behavior During Therapy [facility] for tasks assessed/performed           History reviewed. No pertinent past medical history.  Past Surgical History:  Procedure Laterality Date  . ABDOMINAL HYSTERECTOMY    . TOTAL HIP ARTHROPLASTY Left 03/07/2019   Procedure: TOTAL HIP ARTHROPLASTY;  Surgeon: Dereck Leep, MD;  Location: [facility] ORS;  Service: Orthopedics;  Laterality: Left;    There were no vitals filed for this visit.      [facility] PT Assessment - 11/20/20 0001      Assessment   Referring Provider (PT) Meeler, Whitney    Hand Dominance Right    Next MD Visit 12/2020      Balance Screen   Has the patient fallen in the past 6 months No         SUBJECTIVE Chief complaint:  Patient notes that she has had back pain for several years. Patient had MRI in 08/2020 with results suggestive of multilevel degenerative changes with stenosis and disc protrusions. Patient has had success historically with epidural injections, but recently has not had much relief of late. Patient has also tried "numbing" which offered no success. Patient notes she has pain 99% of the time and has greatest relief in sitting.   Patient has been doing some gentle back stretches as prescribed by ortho. Patient had some relief with L THA, but that has not lasted.  Patient has had abdominal hysterectomy and notes difficulty with using abdominal  muscles.   Pain location: B lumbar,  R SIJ with B radiation to posterior mid thigh Pain: Present 6/10, Best 4/10, Worst 9/10 Pain quality: pain quality: sharp (R SIJ); constant ache Radiating pain: Yes  Numbness/Tingling: Yes (rare)  24 hour pain behavior: AM worse, mid-afternoon is the best time  Aggravating factors: overdoing activity; heavy cleaning; meal prep Easing factors: sitting, gabapentin, tylenol, heat, gentle stretches How long can you sit: 60 min How long can you stand: < 30 min How long can you walk: level  0.5 mi (15-20 min); incline 100 ft  Imaging: Yes;  "Radiographic Data: MRI lumbar spine from [facility] dated 09/01/2020 imaging and report reviewed today. L5-S1 broad-based disc bulging with severe bilateral facet arthropathy. L4-5 grade 1 anterolisthesis with broad-based disc bulging and left sided disc bulge with severe left-sided foraminal stenosis. Mild right-sided foraminal stenosis. Severe central stenosis. Severe bilateral facet arthropathy. L3-4 grade 1 anterolisthesis with right-sided disc bulging and severe bilateral facet arthropathy. Severe central stenosis. Moderate right and mild to moderate left-sided foraminal stenosis. L2-3 disc bulge with prominent bilateral foraminal bulges. Moderate bilateral facet arthropathy. Mild central stenosis. Moderate right and mild to moderate left-sided foraminal stenosis. L1-2 mild bilateral facet arthropathy without stenosis." Bone scan: unsure  Current activities: walking dogs, general household chores, volunteer at animal shelters; shopping, reading  Patient stated goals: "  If I can go through a day and do laundry and change sheets and go for a walk with the puppy and not have extreme pain at the end of the day. I can tolerate the ache."  SCREENING Red Flags: None Have you had any night sweats? Unexplained weight loss? Saddle anesthesia? Unexplained changes in bowel or bladder habits?  Mental Status Patient is oriented to  person, place and time.  Recent memory is intact.  Remote memory is intact.  Attention span and concentration are intact.  Expressive speech is intact.  Patient's fund of knowledge is within normal limits for educational level.  POSTURE/OBSERVATIONS:  Lumbar lordosis: diminished Thoracic kyphosis: increased Iliac crest height: R appearing elevated Lumbar lateral shift: appearing negative Pelvic obliquity: appearing negative Patient has some apparent lateral curvature to the thoracolumbar spine with possible rotational components. Further assessment will be completed at next visit.  REFLEX TESTING: deferred 2/2 to time constraints Patellar Tendon (L3-4): L:  R:  Achilles Tendon (S1-2): L:  R:  Clonus: Babinski:   GAIT: Limited pelvic rotation throughout gait cycle with mild L lateral trunk lean. Trendelenburg R: Positive L: Negative  RANGE OF MOTION: deferred 2/2 to time constraints   LEFT RIGHT  Lumbar forward flexion (65):      Lumbar extension (30):     Lumbar lateral flexion (25):     Thoracic and Lumbar rotation (30):       Hip Flexion (125):      Hip IR (45):     Hip ER (45):     Hip Abduction (40):     Hip extension (15):     Knee flexion (135):     Knee extension (0):     Plantarflexion (20-50):     Dorsiflexion (0-20):     Ankle inversion (30):     Ankle eversion (15):       SENSATION: deferred 2/2 to time constraints  STRENGTH: MMT deferred 2/2 to time constraints  RLE LLE  Hip Flexion    Hip Extension    Hip Abduction     Hip Adduction     Hip ER     Hip IR     Knee Extension    Knee Flexion    Dorsiflexion     Plantarflexion (seated)      PALPATION: TTP over R SIJ  FUNCTIONAL TESTS: Sit to stand: Positive for guarding and hesitancy.    ASSESSMENT Patient is a 75 year old presenting to clinic with chief complaints of low back pain with radiating symptoms to posterior thigh B. Today's evaluation is suggestive of deficits in spinal mobility,  posture, postural strength/endurance, and body mechanics as evidenced by diminished lumbar lordosis, elevated R iliac crest, 9/10 worst pain, and inability to stand unsupported > 30 minutes without considerable increase in pain. Patient's responses on FOTO outcome measures (50) indicate significant functional limitations/disability/distress. Patient's progress may be limited due to structural changes in the lumbar spine and medical history of osteopenia; however, patient's motivation is advantageous. Patient will benefit from continued skilled therapeutic intervention to address deficits in spinal mobility, posture, and body mechanics in order to increase function and improve overall QOL.  EDUCATION Patient educated on prognosis, POC, and provided with HEP including: not provided. Patient articulated understanding and returned demonstration. Patient will benefit from further education in order to maximize compliance and understanding for long-term therapeutic gains.        Objective measurements completed on examination: See above findings.  PT Long Term Goals - 11/20/20 1855      PT LONG TERM GOAL #1   Title Patient will be independent with HEP in order to decrease back pain and increase strength in order to improve pain-free function at home and work.    Baseline IE: not initiated    Time 8    Period Weeks    Status New    Target Date 01/15/21      PT LONG TERM GOAL #2   Title Patient will demonstrate improved function as evidenced by a score of 59 on FOTO measure for full participation in activities at home and in the community.    Baseline IE: 50    Time 8    Period Weeks    Status New    Target Date 01/15/21      PT LONG TERM GOAL #3   Title Patient will decrease worst pain as reported on NPRS by at least 2 points to demonstrate clinically significant reduction in pain in order to restore/improve function and overall QOL.    Baseline IE: 9/10    Time 8    Period  Weeks    Status New    Target Date 01/15/21      PT LONG TERM GOAL #4   Title Patient will be able to perform daily household chores including laundry, bed making, and walking the dog with pain controlled at or below 5/10 on NPRS scale for 24 hour period in order to improve function and QOL.    Baseline IE: 9/10 pain    Time 8    Period Weeks    Status New    Target Date 01/15/21                  Plan - 11/20/20 1850    Clinical Impression Statement Patient is a 75 year old presenting to clinic with chief complaints of low back pain with radiating symptoms to posterior thigh B. Today's evaluation is suggestive of deficits in spinal mobility, posture, postural strength/endurance, and body mechanics as evidenced by diminished lumbar lordosis, elevated R iliac crest, 9/10 worst pain, and inability to stand unsupported > 30 minutes without considerable increase in pain. Patient's responses on FOTO outcome measures (50) indicate significant functional limitations/disability/distress. Patient's progress may be limited due to structural changes in the lumbar spine and medical history of osteopenia; however, patient's motivation is advantageous. Patient will benefit from continued skilled therapeutic intervention to address deficits in spinal mobility, posture, and body mechanics in order to increase function and improve overall QOL.    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 stenosis, osteopenia, h/o of THA L, colon polyp    Examination-Activity Limitations Transfers;Squat;Bend;Lift;Locomotion Level;Carry;Reach Overhead;Stand    Examination-Participation Restrictions Laundry;Yard Work;Meal Prep;Shop;Cleaning    Stability/Clinical Decision Making Evolving/Moderate complexity    Clinical Decision Making Moderate    Rehab Potential Fair    PT  Frequency 1x / week    PT Duration 8 weeks    PT Treatment/Interventions ADLs/Self Care Home Management;Aquatic Therapy;Cryotherapy;Electrical Stimulation;Moist Heat;Therapeutic exercise;Neuromuscular re-education;Patient/family education;Manual techniques;Taping;Energy conservation;Scar mobilization;Orthotic Fit/Training    PT Next Visit Plan physical assessment    PT Home Exercise Plan not initiated    Consulted and Agree with Plan of Care Patient           Patient will benefit from skilled therapeutic intervention in order to improve the following deficits and impairments:  Abnormal  gait,Decreased endurance,Decreased mobility,Difficulty walking,Improper body mechanics,Decreased range of motion,Decreased activity tolerance,Decreased strength,Postural dysfunction,Pain  Visit Diagnosis: Chronic low back pain, unspecified back pain laterality, unspecified whether sciatica present  Abnormal posture     Problem List Patient Active Problem List   Diagnosis Date Noted  . Eczema 03/07/2019  . H/O total hip arthroplasty 03/07/2019  . Primary osteoarthritis of left hip 07/12/2017  . Osteopenia of neck of femur 03/24/2017  . Hyperlipidemia, mixed 03/17/2017  . Low back pain with sciatica 04/08/2015  . Colon polyp 09/10/2013   Myles Gip PT, DPT [phone]  11/20/2020, 6:56 PM  [facility] [address] [place], [place], [address] Phone: [phone]   Fax:  [phone]  Name: CHEVELLA PEARCE MRN: [id number] Date of Birth: January 21, 1946

## 2020-11-25 ENCOUNTER — Ambulatory Visit: Payer: Medicare Other | Admitting: Physical Therapy

## 2020-11-26 ENCOUNTER — Ambulatory Visit: Payer: Medicare Other | Admitting: Physical Therapy

## 2020-11-27 ENCOUNTER — Encounter: Payer: Medicare Other | Admitting: Physical Therapy

## 2020-12-02 ENCOUNTER — Encounter: Payer: Medicare Other | Admitting: Physical Therapy

## 2020-12-02 ENCOUNTER — Ambulatory Visit: Admit: 2020-12-02 | Payer: Medicare Other

## 2020-12-02 SURGERY — COLONOSCOPY
Anesthesia: General

## 2020-12-03 ENCOUNTER — Ambulatory Visit: Payer: Medicare Other | Admitting: Physical Therapy

## 2020-12-03 ENCOUNTER — Other Ambulatory Visit: Payer: Self-pay

## 2020-12-03 DIAGNOSIS — M545 Low back pain, unspecified: Secondary | ICD-10-CM

## 2020-12-03 DIAGNOSIS — R293 Abnormal posture: Secondary | ICD-10-CM

## 2020-12-03 NOTE — Therapy (Signed)
[facility] [address] [place], [place], [address] Phone: [phone]   Fax:  [phone]  Physical Therapy Treatment  Patient Details  Name: Lindsey Freeman MRN: [id number] Date of Birth: Jul 28, 1945 Referring Provider (PT): Meeler, Whitney   Encounter Date: 12/03/2020   PT End of Session - 12/03/20 1149     Visit Number 2    Number of Visits 8    Date for PT Re-Evaluation 01/15/21    PT Start Time 1448    PT Stop Time 1225    PT Time Calculation (min) 40 min    Activity Tolerance Patient tolerated treatment well    Behavior During Therapy [facility] for tasks assessed/performed             No past medical history on file.  Past Surgical History:  Procedure Laterality Date   ABDOMINAL HYSTERECTOMY     TOTAL HIP ARTHROPLASTY Left 03/07/2019   Procedure: TOTAL HIP ARTHROPLASTY;  Surgeon: Dereck Leep, MD;  Location: [facility] ORS;  Service: Orthopedics;  Laterality: Left;    There were no vitals filed for this visit.   Subjective Assessment - 12/03/20 1147     Subjective Patient presents to clinic in considerable discomfort. Patient notes that she is worse today than at the time of her eval. She has had a family member in the hospital and now in rehab which has meant a considerable amount of walking and tension/stress. She is wakign with muscle spasms in the back. She notes pain today is worse on the L than R. She is scheduled for another epidural shot next Friday for pain management.    Currently in Pain? Yes    Pain Score 10-Worst pain ever    Pain Location Sacrum    Pain Orientation Left             TREATMENT  Manual Therapy: STM and TPR performed to L posterior hip and L lumbar region to allow for decreased tension and pain and improved posture and function L Sacral border mobilizations with movement for decreased spasm and improved mobility, grade II/III  Neuromuscular Re-education: Donning of heel lift and gait  for improved postural symmetry and decreased pain Patient discussion of factors impacting pain experience including increased activity, increased stress, and sleep quality.   Patient educated throughout session on appropriate technique and form using multi-modal cueing, HEP, and activity modification. Patient articulated understanding and returned demonstration.  Patient Response to interventions: 7/10  ASSESSMENT Patient presents to clinic with excellent motivation to participate in therapy despite high level of pain. Patient demonstrates deficits in spinal mobility, posture, postural strength/endurance, and body mechanics. Patient able to achieve clinically significant reduction in L hip/lumbar pain during today's session and responded positively to manual interventions. Patient will benefit from continued skilled therapeutic intervention to address remaining deficits in spinal mobility, posture, postural strength/endurance, and body mechanics in order to increase function and improve overall QOL.       PT Long Term Goals - 11/20/20 1855       PT LONG TERM GOAL #1   Title Patient will be independent with HEP in order to decrease back pain and increase strength in order to improve pain-free function at home and work.    Baseline IE: not initiated    Time 8    Period Weeks    Status New    Target Date 01/15/21      PT LONG TERM GOAL #2   Title Patient  will demonstrate improved function as evidenced by a score of 59 on FOTO measure for full participation in activities at home and in the community.    Baseline IE: 50    Time 8    Period Weeks    Status New    Target Date 01/15/21      PT LONG TERM GOAL #3   Title Patient will decrease worst pain as reported on NPRS by at least 2 points to demonstrate clinically significant reduction in pain in order to restore/improve function and overall QOL.    Baseline IE: 9/10    Time 8    Period Weeks    Status New    Target Date 01/15/21       PT LONG TERM GOAL #4   Title Patient will be able to perform daily household chores including laundry, bed making, and walking the dog with pain controlled at or below 5/10 on NPRS scale for 24 hour period in order to improve function and QOL.    Baseline IE: 9/10 pain    Time 8    Period Weeks    Status New    Target Date 01/15/21                   Plan - 12/03/20 1150     Clinical Impression Statement Patient presents to clinic with excellent motivation to participate in therapy despite high level of pain. Patient demonstrates deficits in spinal mobility, posture, postural strength/endurance, and body mechanics. Patient able to achieve clinically significant reduction in L hip/lumbar pain during today's session and responded positively to manual interventions. Patient will benefit from continued skilled therapeutic intervention to address remaining deficits in spinal mobility, posture, postural strength/endurance, and body mechanics in order to increase function and improve overall QOL.    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 stenosis, osteopenia, h/o of THA L, colon polyp    Examination-Activity Limitations Transfers;Squat;Bend;Lift;Locomotion Level;Carry;Reach Overhead;Stand    Examination-Participation Restrictions Laundry;Yard Work;Meal Prep;Shop;Cleaning    Stability/Clinical Decision Making Evolving/Moderate complexity    Rehab Potential Fair    PT Frequency 1x / week    PT Duration 8 weeks    PT Treatment/Interventions ADLs/Self Care Home Management;Aquatic Therapy;Cryotherapy;Electrical Stimulation;Moist Heat;Therapeutic exercise;Neuromuscular re-education;Patient/family education;Manual techniques;Taping;Energy conservation;Scar mobilization;Orthotic Fit/Training    PT Next Visit Plan physical assessment    PT  Home Exercise Plan not initiated    Consulted and Agree with Plan of Care Patient             Patient will benefit from skilled therapeutic intervention in order to improve the following deficits and impairments:  Abnormal gait, Decreased endurance, Decreased mobility, Difficulty walking, Improper body mechanics, Decreased range of motion, Decreased activity tolerance, Decreased strength, Postural dysfunction, Pain  Visit Diagnosis: Chronic low back pain, unspecified back pain laterality, unspecified whether sciatica present  Abnormal posture     Problem List Patient Active Problem List   Diagnosis Date Noted   Eczema 03/07/2019   H/O total hip arthroplasty 03/07/2019   Primary osteoarthritis of left hip 07/12/2017   Osteopenia of neck of femur 03/24/2017   Hyperlipidemia, mixed 03/17/2017   Low back pain with sciatica 04/08/2015   Colon polyp 09/10/2013    Myles Gip PT, DPT [phone]  12/03/2020, 1:59 PM  [facility] [address] [place], [place], [address] Phone: [phone]   Fax:  [phone]  Name:  AIRYANNA DIPALMA MRN: [id number] Date of Birth: 1946/05/27

## 2020-12-04 ENCOUNTER — Encounter: Payer: Medicare Other | Admitting: Physical Therapy

## 2020-12-09 ENCOUNTER — Encounter: Payer: Medicare Other | Admitting: Physical Therapy

## 2020-12-10 ENCOUNTER — Other Ambulatory Visit: Payer: Self-pay

## 2020-12-10 ENCOUNTER — Ambulatory Visit: Payer: Medicare Other | Admitting: Physical Therapy

## 2020-12-10 ENCOUNTER — Encounter: Payer: Self-pay | Admitting: Physical Therapy

## 2020-12-10 DIAGNOSIS — M545 Low back pain, unspecified: Secondary | ICD-10-CM | POA: Diagnosis not present

## 2020-12-10 DIAGNOSIS — G8929 Other chronic pain: Secondary | ICD-10-CM

## 2020-12-10 DIAGNOSIS — R293 Abnormal posture: Secondary | ICD-10-CM

## 2020-12-10 NOTE — Therapy (Signed)
[place] [facility] [address] [place], [place], [address] Phone: [phone]   Fax:  [phone]  Physical Therapy Treatment  Patient Details  Name: Lindsey Freeman MRN: [id number] Date of Birth: [birth year] Referring Provider (PT): Meeler, Whitney   Encounter Date: 12/10/2020   PT End of Session - 12/10/20 1152     Visit Number 3    Number of Visits 8    Date for PT Re-Evaluation 01/15/21    PT Start Time 2426    PT Stop Time 1225    PT Time Calculation (min) 40 min    Activity Tolerance Patient tolerated treatment well    Behavior During Therapy [facility] for tasks assessed/performed             History reviewed. No pertinent past medical history.  Past Surgical History:  Procedure Laterality Date   ABDOMINAL HYSTERECTOMY     TOTAL HIP ARTHROPLASTY Left 03/07/2019   Procedure: TOTAL HIP ARTHROPLASTY;  Surgeon: Dereck Leep, MD;  Location: [facility] ORS;  Service: Orthopedics;  Laterality: Left;    There were no vitals filed for this visit.   Subjective Assessment - 12/10/20 1149     Subjective Patient notes that her back is much improved from last week. She continues to feel some stiffness and pain. Patient has been using heel lift on L with good effect; she noticed the most relief when shopping at [employer].    Currently in Pain? Yes    Pain Score 7     Pain Location Sacrum    Pain Orientation Left             TREATMENT  Manual Therapy: STM and TPR performed to L posterior hip and L lumbar region to allow for decreased tension and pain and improved posture and function L Sacral border mobilizations with movement for decreased spasm and improved mobility, grade II/III  Neuromuscular Re-education: Supine hooklying diaphragmatic breathing with VCs and TCs for downregulation of the nervous system and improved management of IAP Supine pelvic tilts with coordinated breath for improved lumbar mobility and decreased pain Supine  figure 4 stretch for improved pain modulation at gluteals and SIJ Supine hooklying trunk rotations with coordinated breath for improved lumbar mobility and decreased pain Supine single knee to chest with diaphragmatic breath for improved lumbar mobility and decreased pain, BLE Supine hamstring stretch with diaphragmatic breath for improved lumbar mobility and decreased pain    Patient educated throughout session on appropriate technique and form using multi-modal cueing, HEP, and activity modification. Patient articulated understanding and returned demonstration.  Patient Response to interventions: 5/10 in supine, 7/10 at sacrospinous ligament in standing  ASSESSMENT Patient presents to clinic with excellent motivation to participate in therapy despite high level of pain. Patient demonstrates deficits in spinal mobility, posture, postural strength/endurance, and body mechanics. Patient familiar with gentle low back stretches and performed with good form during today's session and responded positively to manual interventions. Patient will benefit from continued skilled therapeutic intervention to address remaining deficits in spinal mobility, posture, postural strength/endurance, and body mechanics in order to increase function and improve overall QOL.     PT Long Term Goals - 11/20/20 1855       PT LONG TERM GOAL #1   Title Patient will be independent with HEP in order to decrease back pain and increase strength in order to improve pain-free function at home and work.    Baseline IE: not initiated    Time 8  Period Weeks    Status New    Target Date 01/15/21      PT LONG TERM GOAL #2   Title Patient will demonstrate improved function as evidenced by a score of 59 on FOTO measure for full participation in activities at home and in the community.    Baseline IE: 50    Time 8    Period Weeks    Status New    Target Date 01/15/21      PT LONG TERM GOAL #3   Title Patient will  decrease worst pain as reported on NPRS by at least 2 points to demonstrate clinically significant reduction in pain in order to restore/improve function and overall QOL.    Baseline IE: 9/10    Time 8    Period Weeks    Status New    Target Date 01/15/21      PT LONG TERM GOAL #4   Title Patient will be able to perform daily household chores including laundry, bed making, and walking the dog with pain controlled at or below 5/10 on NPRS scale for 24 hour period in order to improve function and QOL.    Baseline IE: 9/10 pain    Time 8    Period Weeks    Status New    Target Date 01/15/21                   Plan - 12/10/20 1152     Clinical Impression Statement Patient presents to clinic with excellent motivation to participate in therapy despite high level of pain. Patient demonstrates deficits in spinal mobility, posture, postural strength/endurance, and body mechanics. Patient familiar with gentle low back stretches and performed with good form during today's session and responded positively to manual interventions. Patient will benefit from continued skilled therapeutic intervention to address remaining deficits in spinal mobility, posture, postural strength/endurance, and body mechanics in order to increase function and improve overall QOL.    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 stenosis, osteopenia, h/o of THA L, colon polyp    Examination-Activity Limitations Transfers;Squat;Bend;Lift;Locomotion Level;Carry;Reach Overhead;Stand    Examination-Participation Restrictions Laundry;Yard Work;Meal Prep;Shop;Cleaning    Stability/Clinical Decision Making Evolving/Moderate complexity    Rehab Potential Fair    PT Frequency 1x / week    PT Duration 8 weeks    PT Treatment/Interventions ADLs/Self Care Home Management;Aquatic  Therapy;Cryotherapy;Electrical Stimulation;Moist Heat;Therapeutic exercise;Neuromuscular re-education;Patient/family education;Manual techniques;Taping;Energy conservation;Scar mobilization;Orthotic Fit/Training    PT Home Exercise Plan not initiated    Consulted and Agree with Plan of Care Patient             Patient will benefit from skilled therapeutic intervention in order to improve the following deficits and impairments:  Abnormal gait, Decreased endurance, Decreased mobility, Difficulty walking, Improper body mechanics, Decreased range of motion, Decreased activity tolerance, Decreased strength, Postural dysfunction, Pain  Visit Diagnosis: Chronic low back pain, unspecified back pain laterality, unspecified whether sciatica present  Abnormal posture     Problem List Patient Active Problem List   Diagnosis Date Noted   Eczema 03/07/2019   H/O total hip arthroplasty 03/07/2019   Primary osteoarthritis of left hip 07/12/2017   Osteopenia of neck of femur 03/24/2017   Hyperlipidemia, mixed 03/17/2017   Low back pain with sciatica 04/08/2015   Colon polyp 09/10/2013    Myles Gip PT, DPT [phone]  12/10/2020, 1:20 PM  [facility] [address] [place], [place], [address] Phone: [phone]   Fax:  [phone]  Name: Lindsey Freeman MRN: [id number] Date of Birth: 1945/10/30

## 2020-12-11 ENCOUNTER — Encounter: Payer: Medicare Other | Admitting: Physical Therapy

## 2020-12-16 ENCOUNTER — Encounter: Payer: Medicare Other | Admitting: Physical Therapy

## 2020-12-17 ENCOUNTER — Encounter: Payer: Self-pay | Admitting: Physical Therapy

## 2020-12-17 ENCOUNTER — Ambulatory Visit: Payer: Medicare Other | Admitting: Physical Therapy

## 2020-12-17 ENCOUNTER — Encounter: Payer: Medicare Other | Admitting: Physical Therapy

## 2020-12-17 ENCOUNTER — Other Ambulatory Visit: Payer: Self-pay

## 2020-12-17 DIAGNOSIS — M545 Low back pain, unspecified: Secondary | ICD-10-CM | POA: Diagnosis not present

## 2020-12-17 DIAGNOSIS — R293 Abnormal posture: Secondary | ICD-10-CM

## 2020-12-17 NOTE — Therapy (Signed)
[facility] [address] [place], [place], [address] Phone: [phone]   Fax:  [phone]  Physical Therapy Treatment  Patient Details  Name: Lindsey Freeman MRN: [id number] Date of Birth: 13-Dec-1945 Referring Provider (PT): Meeler, Whitney   Encounter Date: 12/17/2020   PT End of Session - 12/17/20 1155     Visit Number 4    Number of Visits 8    Date for PT Re-Evaluation 01/15/21    PT Start Time 5361    PT Stop Time 1225    PT Time Calculation (min) 40 min    Activity Tolerance Patient tolerated treatment well    Behavior During Therapy [facility] for tasks assessed/performed             History reviewed. No pertinent past medical history.  Past Surgical History:  Procedure Laterality Date   ABDOMINAL HYSTERECTOMY     TOTAL HIP ARTHROPLASTY Left 03/07/2019   Procedure: TOTAL HIP ARTHROPLASTY;  Surgeon: Dereck Leep, MD;  Location: [facility] ORS;  Service: Orthopedics;  Laterality: Left;    There were no vitals filed for this visit.   Subjective Assessment - 12/17/20 1148     Subjective Patient reports that she had her injection and had good effect. She reports that MD cleared her to participate in gentle manual and stretches today. She notes Monday she had some overall fatigue and generalized soreness. Patient reports today she is feeling good. Patient does report that she gets good relief from manual interventions and stretches.    Currently in Pain? Yes    Pain Score 6     Pain Location Sacrum    Pain Orientation Left             TREATMENT  Manual Therapy: STM and TPR performed to L posterior hip and L lumbar region to allow for decreased tension and pain and improved posture and function L Sacral border mobilizations with movement for decreased spasm and improved mobility, grade II/III  Neuromuscular Re-education: Sidelying diaphragmatic breathing with VCs and TCs for downregulation of the nervous system and  improved management of IAP L mermaid stretch for improved posture and decreased pain    Patient educated throughout session on appropriate technique and form using multi-modal cueing, HEP, and activity modification. Patient articulated understanding and returned demonstration.  Patient Response to interventions: Comfortable to add mermaid stretch to HEP  ASSESSMENT Patient presents to clinic with excellent motivation to participate in therapy despite high level of pain. Patient demonstrates deficits in spinal mobility, posture, postural strength/endurance, and body mechanics. Patient tolerated L side mermaid stretch well during today's session and responded positively to manual interventions. Patient will benefit from continued skilled therapeutic intervention to address remaining deficits in spinal mobility, posture, postural strength/endurance, and body mechanics in order to increase function and improve overall QOL.     PT Long Term Goals - 11/20/20 1855       PT LONG TERM GOAL #1   Title Patient will be independent with HEP in order to decrease back pain and increase strength in order to improve pain-free function at home and work.    Baseline IE: not initiated    Time 8    Period Weeks    Status New    Target Date 01/15/21      PT LONG TERM GOAL #2   Title Patient will demonstrate improved function as evidenced by a score of 59 on FOTO measure for full participation in activities at  home and in the community.    Baseline IE: 50    Time 8    Period Weeks    Status New    Target Date 01/15/21      PT LONG TERM GOAL #3   Title Patient will decrease worst pain as reported on NPRS by at least 2 points to demonstrate clinically significant reduction in pain in order to restore/improve function and overall QOL.    Baseline IE: 9/10    Time 8    Period Weeks    Status New    Target Date 01/15/21      PT LONG TERM GOAL #4   Title Patient will be able to perform daily household  chores including laundry, bed making, and walking the dog with pain controlled at or below 5/10 on NPRS scale for 24 hour period in order to improve function and QOL.    Baseline IE: 9/10 pain    Time 8    Period Weeks    Status New    Target Date 01/15/21                   Plan - 12/17/20 1248     Clinical Impression Statement Patient presents to clinic with excellent motivation to participate in therapy despite high level of pain. Patient demonstrates deficits in spinal mobility, posture, postural strength/endurance, and body mechanics. Patient tolerated L side mermaid stretch well during today's session and responded positively to manual interventions. Patient will benefit from continued skilled therapeutic intervention to address remaining deficits in spinal mobility, posture, postural strength/endurance, and body mechanics in order to increase function and improve overall QOL.    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 stenosis, osteopenia, h/o of THA L, colon polyp    Examination-Activity Limitations Transfers;Squat;Bend;Lift;Locomotion Level;Carry;Reach Overhead;Stand    Examination-Participation Restrictions Laundry;Yard Work;Meal Prep;Shop;Cleaning    Stability/Clinical Decision Making Evolving/Moderate complexity    Rehab Potential Fair    PT Frequency 1x / week    PT Duration 8 weeks    PT Treatment/Interventions ADLs/Self Care Home Management;Aquatic Therapy;Cryotherapy;Electrical Stimulation;Moist Heat;Therapeutic exercise;Neuromuscular re-education;Patient/family education;Manual techniques;Taping;Energy conservation;Scar mobilization;Orthotic Fit/Training    PT Next Visit Plan abdominal strengthening, MET as tolerated    PT Home Exercise Plan gentle lumbar stretches, mermaid stretch    Consulted and Agree with Plan of  Care Patient             Patient will benefit from skilled therapeutic intervention in order to improve the following deficits and impairments:  Abnormal gait, Decreased endurance, Decreased mobility, Difficulty walking, Improper body mechanics, Decreased range of motion, Decreased activity tolerance, Decreased strength, Postural dysfunction, Pain  Visit Diagnosis: Chronic low back pain, unspecified back pain laterality, unspecified whether sciatica present  Abnormal posture     Problem List Patient Active Problem List   Diagnosis Date Noted   Eczema 03/07/2019   H/O total hip arthroplasty 03/07/2019   Primary osteoarthritis of left hip 07/12/2017   Osteopenia of neck of femur 03/24/2017   Hyperlipidemia, mixed 03/17/2017   Low back pain with sciatica 04/08/2015   Colon polyp 09/10/2013    Myles Gip PT, DPT [phone]  12/17/2020, 12:55 PM  [facility] [address] [place], [place], [address] Phone: [phone]   Fax:  [phone]  Name: Lindsey Freeman MRN: [id number] Date of Birth: [birth year]

## 2020-12-25 ENCOUNTER — Ambulatory Visit: Payer: Medicare Other | Attending: Family Medicine | Admitting: Physical Therapy

## 2020-12-25 ENCOUNTER — Encounter: Payer: Self-pay | Admitting: Physical Therapy

## 2020-12-25 ENCOUNTER — Encounter: Payer: Medicare Other | Admitting: Physical Therapy

## 2020-12-25 ENCOUNTER — Other Ambulatory Visit: Payer: Self-pay

## 2020-12-25 DIAGNOSIS — G8929 Other chronic pain: Secondary | ICD-10-CM | POA: Diagnosis present

## 2020-12-25 DIAGNOSIS — M545 Low back pain, unspecified: Secondary | ICD-10-CM | POA: Insufficient documentation

## 2020-12-25 DIAGNOSIS — R293 Abnormal posture: Secondary | ICD-10-CM | POA: Insufficient documentation

## 2020-12-25 NOTE — Therapy (Signed)
[facility] [address] [place], [place], [address] Phone: [phone]   Fax:  [phone]  Physical Therapy Treatment  Patient Details  Name: Lindsey Freeman MRN: [id number] Date of Birth: [birth year] Referring Provider (PT): Meeler, Whitney   Encounter Date: 12/25/2020   PT End of Session - 12/25/20 1115     Visit Number 5    Number of Visits 8    Date for PT Re-Evaluation 01/15/21    PT Start Time 1100    PT Stop Time 1140    PT Time Calculation (min) 40 min    Activity Tolerance Patient tolerated treatment well    Behavior During Therapy [facility] for tasks assessed/performed             History reviewed. No pertinent past medical history.  Past Surgical History:  Procedure Laterality Date   ABDOMINAL HYSTERECTOMY     TOTAL HIP ARTHROPLASTY Left 03/07/2019   Procedure: TOTAL HIP ARTHROPLASTY;  Surgeon: Dereck Leep, MD;  Location: [facility] ORS;  Service: Orthopedics;  Laterality: Left;    There were no vitals filed for this visit.   Subjective Assessment - 12/25/20 1104     Subjective Patient notes that she had a busy week. Patient does note some stress with family health concerns and has correlated this with pain. Patient has been doing stretches with good relief. Patient does note that she gets 2-3 days with manual.    Currently in Pain? Yes    Pain Score 6     Pain Location Sacrum            TREATMENT  Manual Therapy: STM and TPR performed to L posterior hip and L lumbar region to allow for decreased tension and pain and improved posture and function L Sacral border mobilizations with movement for decreased spasm and improved mobility, grade II/III  Neuromuscular Re-education: Sidelying diaphragmatic breathing with VCs and TCs for downregulation of the nervous system and improved management of IAP Seated pelvic MET for improved pelvic posture and decreased L SIJ pain Standing Pilates postural control: prep and  serve a tray, RTB, for improved postural awareness and control    Patient educated throughout session on appropriate technique and form using multi-modal cueing, HEP, and activity modification. Patient articulated understanding and returned demonstration.  Patient Response to interventions: Comfortable to add MET to HEP  ASSESSMENT Patient presents to clinic with excellent motivation to participate in therapy despite high level of pain. Patient demonstrates deficits in spinal mobility, posture, postural strength/endurance, and body mechanics. Patient with excellent form during Pilates exercises during today's session and responded positively to manual interventions. Patient will benefit from continued skilled therapeutic intervention to address remaining deficits in spinal mobility, posture, postural strength/endurance, and body mechanics in order to increase function and improve overall QOL.    PT Long Term Goals - 11/20/20 1855       PT LONG TERM GOAL #1   Title Patient will be independent with HEP in order to decrease back pain and increase strength in order to improve pain-free function at home and work.    Baseline IE: not initiated    Time 8    Period Weeks    Status New    Target Date 01/15/21      PT LONG TERM GOAL #2   Title Patient will demonstrate improved function as evidenced by a score of 59 on FOTO measure for full participation in activities at home and in the community.  Baseline IE: 50    Time 8    Period Weeks    Status New    Target Date 01/15/21      PT LONG TERM GOAL #3   Title Patient will decrease worst pain as reported on NPRS by at least 2 points to demonstrate clinically significant reduction in pain in order to restore/improve function and overall QOL.    Baseline IE: 9/10    Time 8    Period Weeks    Status New    Target Date 01/15/21      PT LONG TERM GOAL #4   Title Patient will be able to perform daily household chores including laundry, bed  making, and walking the dog with pain controlled at or below 5/10 on NPRS scale for 24 hour period in order to improve function and QOL.    Baseline IE: 9/10 pain    Time 8    Period Weeks    Status New    Target Date 01/15/21                   Plan - 12/25/20 1115     Clinical Impression Statement Patient presents to clinic with excellent motivation to participate in therapy despite high level of pain. Patient demonstrates deficits in spinal mobility, posture, postural strength/endurance, and body mechanics. Patient with excellent form during Pilates exercises during today's session and responded positively to manual interventions. Patient will benefit from continued skilled therapeutic intervention to address remaining deficits in spinal mobility, posture, postural strength/endurance, and body mechanics in order to increase function and improve overall QOL.    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 stenosis, osteopenia, h/o of THA L, colon polyp    Examination-Activity Limitations Transfers;Squat;Bend;Lift;Locomotion Level;Carry;Reach Overhead;Stand    Examination-Participation Restrictions Laundry;Yard Work;Meal Prep;Shop;Cleaning    Stability/Clinical Decision Making Evolving/Moderate complexity    Rehab Potential Fair    PT Frequency 1x / week    PT Duration 8 weeks    PT Treatment/Interventions ADLs/Self Care Home Management;Aquatic Therapy;Cryotherapy;Electrical Stimulation;Moist Heat;Therapeutic exercise;Neuromuscular re-education;Patient/family education;Manual techniques;Taping;Energy conservation;Scar mobilization;Orthotic Fit/Training    PT Next Visit Plan abdominal strengthening, MET as tolerated    PT Home Exercise Plan gentle lumbar stretches, mermaid stretch    Consulted and Agree with Plan of Care Patient              Patient will benefit from skilled therapeutic intervention in order to improve the following deficits and impairments:  Abnormal gait, Decreased endurance, Decreased mobility, Difficulty walking, Improper body mechanics, Decreased range of motion, Decreased activity tolerance, Decreased strength, Postural dysfunction, Pain  Visit Diagnosis: Chronic low back pain, unspecified back pain laterality, unspecified whether sciatica present  Abnormal posture     Problem List Patient Active Problem List   Diagnosis Date Noted   Eczema 03/07/2019   H/O total hip arthroplasty 03/07/2019   Primary osteoarthritis of left hip 07/12/2017   Osteopenia of neck of femur 03/24/2017   Hyperlipidemia, mixed 03/17/2017   Low back pain with sciatica 04/08/2015   Colon polyp 09/10/2013   Myles Gip PT, DPT [phone]  12/25/2020, 2:17 PM  [place] [facility] [address] [place], [place], [address] Phone: [phone]   Fax:  [phone]  Name: ELLEE WAWRZYNIAK MRN: [id number] Date of Birth: Oct 24, 1945

## 2020-12-30 ENCOUNTER — Encounter: Payer: Self-pay | Admitting: Physical Therapy

## 2020-12-30 ENCOUNTER — Ambulatory Visit: Payer: Medicare Other | Admitting: Physical Therapy

## 2020-12-30 ENCOUNTER — Encounter: Payer: Medicare Other | Admitting: Physical Therapy

## 2020-12-30 ENCOUNTER — Other Ambulatory Visit: Payer: Self-pay

## 2020-12-30 DIAGNOSIS — M545 Low back pain, unspecified: Secondary | ICD-10-CM

## 2020-12-30 DIAGNOSIS — G8929 Other chronic pain: Secondary | ICD-10-CM

## 2020-12-30 DIAGNOSIS — R293 Abnormal posture: Secondary | ICD-10-CM

## 2020-12-30 NOTE — Therapy (Signed)
[facility] [address] [place], [place], [address] Phone: [phone]   Fax:  [phone]  Physical Therapy Treatment  Patient Details  Name: Lindsey Freeman MRN: [id number] Date of Birth: 07-22-1945 Referring Provider (PT): Meeler, Whitney   Encounter Date: 12/30/2020   PT End of Session - 12/30/20 1207     Visit Number 6    Number of Visits 8    Date for PT Re-Evaluation 01/15/21    PT Start Time 1200    PT Stop Time 1240    PT Time Calculation (min) 40 min    Activity Tolerance Patient tolerated treatment well    Behavior During Therapy [facility] for tasks assessed/performed             History reviewed. No pertinent past medical history.  Past Surgical History:  Procedure Laterality Date   ABDOMINAL HYSTERECTOMY     TOTAL HIP ARTHROPLASTY Left 03/07/2019   Procedure: TOTAL HIP ARTHROPLASTY;  Surgeon: Dereck Leep, MD;  Location: [facility] ORS;  Service: Orthopedics;  Laterality: Left;    There were no vitals filed for this visit.   Subjective Assessment - 12/30/20 1204     Subjective Patient reports that her back has been aggravated by doign a lot of rearranging and home preparation for her mother's return to home.    Currently in Pain? Yes    Pain Score 8     Pain Location Sacrum    Pain Orientation Left             TREATMENT  Manual Therapy: STM and TPR performed to L posterior hip and L lumbar region to allow for decreased tension and pain and improved posture and function L Sacral border mobilizations with movement for decreased spasm and improved mobility, grade II/III L innominate mobilizations for improved mobility and decreased pain, bidrectional   Patient educated throughout session on appropriate technique and form using multi-modal cueing, HEP, and activity modification. Patient articulated understanding and returned demonstration.  Patient Response to interventions: Notes much improved  pain  ASSESSMENT Patient presents to clinic with excellent motivation to participate in therapy despite high level of pain. Patient demonstrates deficits in spinal mobility, posture, postural strength/endurance, and body mechanics. Patient had significant reduction in pain per her report during today's session and responded positively to manual interventions. Patient will benefit from continued skilled therapeutic intervention to address remaining deficits in spinal mobility, posture, postural strength/endurance, and body mechanics in order to increase function and improve overall QOL.    PT Long Term Goals - 11/20/20 1855       PT LONG TERM GOAL #1   Title Patient will be independent with HEP in order to decrease back pain and increase strength in order to improve pain-free function at home and work.    Baseline IE: not initiated    Time 8    Period Weeks    Status New    Target Date 01/15/21      PT LONG TERM GOAL #2   Title Patient will demonstrate improved function as evidenced by a score of 59 on FOTO measure for full participation in activities at home and in the community.    Baseline IE: 50    Time 8    Period Weeks    Status New    Target Date 01/15/21      PT LONG TERM GOAL #3   Title Patient will decrease worst pain as reported on NPRS by at  least 2 points to demonstrate clinically significant reduction in pain in order to restore/improve function and overall QOL.    Baseline IE: 9/10    Time 8    Period Weeks    Status New    Target Date 01/15/21      PT LONG TERM GOAL #4   Title Patient will be able to perform daily household chores including laundry, bed making, and walking the dog with pain controlled at or below 5/10 on NPRS scale for 24 hour period in order to improve function and QOL.    Baseline IE: 9/10 pain    Time 8    Period Weeks    Status New    Target Date 01/15/21                   Plan - 12/30/20 1208     Clinical Impression Statement  Patient presents to clinic with excellent motivation to participate in therapy despite high level of pain. Patient demonstrates deficits in spinal mobility, posture, postural strength/endurance, and body mechanics. Patient had significant reduction in pain per her report during today's session and responded positively to manual interventions. Patient will benefit from continued skilled therapeutic intervention to address remaining deficits in spinal mobility, posture, postural strength/endurance, and body mechanics in order to increase function and improve overall QOL.    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 stenosis, osteopenia, h/o of THA L, colon polyp    Examination-Activity Limitations Transfers;Squat;Bend;Lift;Locomotion Level;Carry;Reach Overhead;Stand    Examination-Participation Restrictions Laundry;Yard Work;Meal Prep;Shop;Cleaning    Stability/Clinical Decision Making Evolving/Moderate complexity    Rehab Potential Fair    PT Frequency 1x / week    PT Duration 8 weeks    PT Treatment/Interventions ADLs/Self Care Home Management;Aquatic Therapy;Cryotherapy;Electrical Stimulation;Moist Heat;Therapeutic exercise;Neuromuscular re-education;Patient/family education;Manual techniques;Taping;Energy conservation;Scar mobilization;Orthotic Fit/Training    PT Next Visit Plan abdominal strengthening, MET as tolerated    PT Home Exercise Plan gentle lumbar stretches, mermaid stretch    Consulted and Agree with Plan of Care Patient             Patient will benefit from skilled therapeutic intervention in order to improve the following deficits and impairments:  Abnormal gait, Decreased endurance, Decreased mobility, Difficulty walking, Improper body mechanics, Decreased range of motion, Decreased activity tolerance, Decreased strength, Postural  dysfunction, Pain  Visit Diagnosis: Chronic low back pain, unspecified back pain laterality, unspecified whether sciatica present  Abnormal posture     Problem List Patient Active Problem List   Diagnosis Date Noted   Eczema 03/07/2019   H/O total hip arthroplasty 03/07/2019   Primary osteoarthritis of left hip 07/12/2017   Osteopenia of neck of femur 03/24/2017   Hyperlipidemia, mixed 03/17/2017   Low back pain with sciatica 04/08/2015   Colon polyp 09/10/2013    Myles Gip PT, DPT [phone]  12/30/2020, 2:09 PM  [facility] [address] [place], [place], [address] Phone: [phone]   Fax:  [phone]  Name: Lindsey Freeman MRN: [id number] Date of Birth: [birth year]

## 2021-01-06 ENCOUNTER — Ambulatory Visit: Payer: Medicare Other | Admitting: Physical Therapy

## 2021-01-06 ENCOUNTER — Encounter: Payer: Self-pay | Admitting: Physical Therapy

## 2021-01-06 ENCOUNTER — Other Ambulatory Visit: Payer: Self-pay

## 2021-01-06 DIAGNOSIS — M545 Low back pain, unspecified: Secondary | ICD-10-CM | POA: Diagnosis not present

## 2021-01-06 DIAGNOSIS — G8929 Other chronic pain: Secondary | ICD-10-CM

## 2021-01-06 DIAGNOSIS — R293 Abnormal posture: Secondary | ICD-10-CM

## 2021-01-06 NOTE — Therapy (Signed)
[facility] [address] [place], [place], [address] Phone: [phone]   Fax:  [phone]  Physical Therapy Treatment  Patient Details  Name: Lindsey Freeman MRN: [id number] Date of Birth: 1946/03/15 Referring Provider (PT): Meeler, Whitney   Encounter Date: 01/06/2021   PT End of Session - 01/06/21 1558     Visit Number 7    Number of Visits 8    Date for PT Re-Evaluation 01/15/21    PT Start Time 1500    PT Stop Time 1545    PT Time Calculation (min) 45 min    Activity Tolerance Patient tolerated treatment well    Behavior During Therapy [facility] for tasks assessed/performed             History reviewed. No pertinent past medical history.  Past Surgical History:  Procedure Laterality Date   ABDOMINAL HYSTERECTOMY     TOTAL HIP ARTHROPLASTY Left 03/07/2019   Procedure: TOTAL HIP ARTHROPLASTY;  Surgeon: Dereck Leep, MD;  Location: [facility] ORS;  Service: Orthopedics;  Laterality: Left;    There were no vitals filed for this visit.   Subjective Assessment - 01/06/21 1504     Subjective Patient notes that her back is pretty good today but that might be because afternoons are the best time of day. 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 Patient is amenable to trialing some abdominal strengthening.    Currently in Pain? Yes    Pain Score 6     Pain Location Sacrum    Pain Orientation Left            TREATMENT  Manual Therapy: STM and TPR performed to L posterior hip and L lumbar region to allow for decreased tension and pain and improved posture and function L Sacral border mobilizations with movement for decreased spasm and improved mobility, grade II/III  Neuromuscular Re-education: Sidelying diaphragmatic breathing with VCs and TCs for downregulation of the nervous system and improved management of IAP Balance disc, seated postural control/awareness:  Pelvic  tilts with coordinated breath,  anterior-posterior, lateral  Pelvic neutral diaphragmatic breathing  Pelvic neutral TrA activation with coordinated breath. Patient education on scar massage for improved MFR at lower abdominal scar site.  Patient educated throughout session on appropriate technique and form using multi-modal cueing, HEP, and activity modification. Patient articulated understanding and returned demonstration.  Patient Response to interventions: Comfortable to add seated abdominal strengthening to HEP  ASSESSMENT Patient presents to clinic with excellent motivation to participate in therapy despite high level of pain. Patient demonstrates deficits in spinal mobility, posture, postural strength/endurance, and body mechanics. Patient required moderate cueing to ensure consistency with seated diaphragmatic breathing, but was able to coordinate consistently with external cueing during today's session and responded positively to manual interventions. Patient will benefit from continued skilled therapeutic intervention to address remaining deficits in spinal mobility, posture, postural strength/endurance, and body mechanics in order to increase function and improve overall QOL.    PT Long Term Goals - 11/20/20 1855       PT LONG TERM GOAL #1   Title Patient will be independent with HEP in order to decrease back pain and increase strength in order to improve pain-free function at home and work.    Baseline IE: not initiated    Time 8    Period Weeks    Status New    Target Date 01/15/21  PT LONG TERM GOAL #2   Title Patient will demonstrate improved function as evidenced by a score of 59 on FOTO measure for full participation in activities at home and in the community.    Baseline IE: 50    Time 8    Period Weeks    Status New    Target Date 01/15/21      PT LONG TERM GOAL #3   Title Patient will decrease worst pain as reported on NPRS by at least 2 points to demonstrate  clinically significant reduction in pain in order to restore/improve function and overall QOL.    Baseline IE: 9/10    Time 8    Period Weeks    Status New    Target Date 01/15/21      PT LONG TERM GOAL #4   Title Patient will be able to perform daily household chores including laundry, bed making, and walking the dog with pain controlled at or below 5/10 on NPRS scale for 24 hour period in order to improve function and QOL.    Baseline IE: 9/10 pain    Time 8    Period Weeks    Status New    Target Date 01/15/21                   Plan - 01/06/21 1559     Clinical Impression Statement Patient presents to clinic with excellent motivation to participate in therapy despite high level of pain. Patient demonstrates deficits in spinal mobility, posture, postural strength/endurance, and body mechanics. Patient required moderate cueing to ensure consistency with seated diaphragmatic breathing, but was able to coordinate consistently with external cueing during today's session and responded positively to manual interventions. Patient will benefit from continued skilled therapeutic intervention to address remaining deficits in spinal mobility, posture, postural strength/endurance, and body mechanics in order to increase function and improve overall QOL.    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 stenosis, osteopenia, h/o of THA L, colon polyp    Examination-Activity Limitations Transfers;Squat;Bend;Lift;Locomotion Level;Carry;Reach Overhead;Stand    Examination-Participation Restrictions Laundry;Yard Work;Meal Prep;Shop;Cleaning    Stability/Clinical Decision Making Evolving/Moderate complexity    Rehab Potential Fair    PT Frequency 1x / week    PT Duration 8 weeks    PT Treatment/Interventions ADLs/Self Care Home Management;Aquatic  Therapy;Cryotherapy;Electrical Stimulation;Moist Heat;Therapeutic exercise;Neuromuscular re-education;Patient/family education;Manual techniques;Taping;Energy conservation;Scar mobilization;Orthotic Fit/Training    PT Next Visit Plan abdominal strengthening progression, manual, scar massage    PT Home Exercise Plan gentle lumbar stretches, mermaid stretch    Consulted and Agree with Plan of Care Patient             Patient will benefit from skilled therapeutic intervention in order to improve the following deficits and impairments:  Abnormal gait, Decreased endurance, Decreased mobility, Difficulty walking, Improper body mechanics, Decreased range of motion, Decreased activity tolerance, Decreased strength, Postural dysfunction, Pain  Visit Diagnosis: Chronic low back pain, unspecified back pain laterality, unspecified whether sciatica present  Abnormal posture     Problem List Patient Active Problem List   Diagnosis Date Noted   Eczema 03/07/2019   H/O total hip arthroplasty 03/07/2019   Primary osteoarthritis of left hip 07/12/2017   Osteopenia of neck of femur 03/24/2017   Hyperlipidemia, mixed 03/17/2017   Low back pain with sciatica 04/08/2015   Colon polyp 09/10/2013    Myles Gip PT, DPT [phone]  01/06/2021, 4:06 PM  [facility] [address] [place], [place], [address] Phone: [phone]   Fax:  [phone]  Name: Lindsey Freeman MRN: [id number] Date of Birth: 12/07/1945

## 2021-01-15 ENCOUNTER — Encounter: Payer: Self-pay | Admitting: Physical Therapy

## 2021-01-15 ENCOUNTER — Other Ambulatory Visit: Payer: Self-pay

## 2021-01-15 ENCOUNTER — Ambulatory Visit: Payer: Medicare Other | Admitting: Physical Therapy

## 2021-01-15 DIAGNOSIS — R293 Abnormal posture: Secondary | ICD-10-CM

## 2021-01-15 DIAGNOSIS — M545 Low back pain, unspecified: Secondary | ICD-10-CM | POA: Diagnosis not present

## 2021-01-15 DIAGNOSIS — G8929 Other chronic pain: Secondary | ICD-10-CM

## 2021-01-15 NOTE — Therapy (Signed)
[facility] [address] [place], [place], [address] Phone: [phone]   Fax:  [phone]  Physical Therapy Treatment  Patient Details  Name: Lindsey Freeman MRN: [id number] Date of Birth: [birth year] Referring Provider (PT): Meeler, Whitney   Encounter Date: 01/15/2021   PT End of Session - 01/15/21 1411     Visit Number 8    Number of Visits 16    Date for PT Re-Evaluation 03/12/21    PT Start Time 2952    PT Stop Time 1455    PT Time Calculation (min) 40 min    Activity Tolerance Patient tolerated treatment well    Behavior During Therapy [facility] for tasks assessed/performed             History reviewed. No pertinent past medical history.  Past Surgical History:  Procedure Laterality Date   ABDOMINAL HYSTERECTOMY     TOTAL HIP ARTHROPLASTY Left 03/07/2019   Procedure: TOTAL HIP ARTHROPLASTY;  Surgeon: Dereck Leep, MD;  Location: [facility] ORS;  Service: Orthopedics;  Laterality: Left;    There were no vitals filed for this visit.   Subjective Assessment - 01/15/21 1628     Subjective Patient presents to clinic wiht increased stiffness and pain. Patient notes that she has been doing more housework in the past few days which may be a contributing factor. Patient did take a muscle relaxant last night prior to bed and reports she slept so soundly that she doesn't think she moved last night at all. Patient does state that she feels PT is helping despite the fact that her AM pain and stiffness levels remain quite elevated.    Currently in Pain? Yes    Pain Score 7     Pain Location Sacrum    Pain Orientation Left              TREATMENT  Manual Therapy: STM and TPR performed to L posterior hip and L lumbar region to allow for decreased tension and pain and improved posture and function L Sacral border mobilizations with movement for decreased spasm and improved mobility, grade II/III  Neuromuscular  Re-education: Reassessed goals; see below. Patient discussion on pain coping skills including: activity modification and activity pacing for improved function daily versus good day/bad day from overloading activity.  Patient educated throughout session on appropriate technique and form using multi-modal cueing, HEP, and activity modification. Patient articulated understanding and returned demonstration.  Patient Response to interventions: Return in 1 week.  ASSESSMENT Patient presents to clinic with excellent motivation to participate in therapy despite high level of pain. Patient demonstrates deficits in spinal mobility, posture, postural strength/endurance, and body mechanics. Patient indicating progress toward goal achievement during today's session and met both MDC and MCII thresholds for improved function as measured on FOTO outcome measure. Patient's condition has the potential to improve in response to therapy. Maximum improvement is yet to be obtained. The anticipated improvement is attainable and reasonable in a generally predictable time. Patient will benefit from continued skilled therapeutic intervention to address remaining deficits in spinal mobility, posture, postural strength/endurance, and body mechanics in order to increase function and improve overall QOL.    PT Long Term Goals - 01/15/21 1418       PT LONG TERM GOAL #1   Title Patient will be independent with HEP in order to decrease back pain and increase strength in order to improve pain-free function at home and work.    Baseline IE: not  initiated; 7/28: IND    Time 8    Period Weeks    Status Achieved    Target Date 01/15/21      PT LONG TERM GOAL #2   Title Patient will demonstrate improved function as evidenced by a score of 59 on FOTO measure for full participation in activities at home and in the community.    Baseline IE: 50; 7/28: 57    Time 8    Period Weeks    Status On-going    Target Date 03/12/21       PT LONG TERM GOAL #3   Title Patient will decrease worst pain as reported on NPRS by at least 2 points to demonstrate clinically significant reduction in pain in order to restore/improve function and overall QOL.    Baseline IE: 9/10; 7/28: AM 9/10, PM 6/10    Time 8    Period Weeks    Status On-going    Target Date 03/12/21      PT LONG TERM GOAL #4   Title Patient will be able to perform daily household chores including laundry, bed making, and walking the dog with pain controlled at or below 5/10 on NPRS scale for 24 hour period in order to improve function and QOL.    Baseline IE: 9/10 pain; 7/28: 7/10 pain    Time 8    Period Weeks    Status On-going    Target Date 03/12/21                   Plan - 01/15/21 1412     Clinical Impression Statement Patient presents to clinic with excellent motivation to participate in therapy despite high level of pain. Patient demonstrates deficits in spinal mobility, posture, postural strength/endurance, and body mechanics. Patient indicating progress toward goal achievement during today's session and met both MDC and MCII thresholds for improved function as measured on FOTO outcome measure. Patient's condition has the potential to improve in response to therapy. Maximum improvement is yet to be obtained. The anticipated improvement is attainable and reasonable in a generally predictable time. Patient will benefit from continued skilled therapeutic intervention to address remaining deficits in spinal mobility, posture, postural strength/endurance, and body mechanics in order to increase function and improve overall QOL.    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 stenosis, osteopenia, h/o of THA L, colon polyp    Examination-Activity Limitations Transfers;Squat;Bend;Lift;Locomotion  Level;Carry;Reach Overhead;Stand    Examination-Participation Restrictions Laundry;Yard Work;Meal Prep;Shop;Cleaning    Stability/Clinical Decision Making Evolving/Moderate complexity    Rehab Potential Fair    PT Frequency 1x / week    PT Duration 8 weeks    PT Treatment/Interventions ADLs/Self Care Home Management;Aquatic Therapy;Cryotherapy;Electrical Stimulation;Moist Heat;Therapeutic exercise;Neuromuscular re-education;Patient/family education;Manual techniques;Taping;Energy conservation;Scar mobilization;Orthotic Fit/Training    PT Next Visit Plan abdominal strengthening progression, manual, scar massage    PT Home Exercise Plan gentle lumbar stretches, mermaid stretch    Consulted and Agree with Plan of Care Patient             Patient will benefit from skilled therapeutic intervention in order to improve the following deficits and impairments:  Abnormal gait, Decreased endurance, Decreased mobility, Difficulty walking, Improper body mechanics, Decreased range of motion, Decreased activity tolerance, Decreased strength, Postural dysfunction, Pain  Visit Diagnosis: Chronic low back pain, unspecified back pain laterality, unspecified whether sciatica present  Abnormal posture     Problem List Patient  Active Problem List   Diagnosis Date Noted   Eczema 03/07/2019   H/O total hip arthroplasty 03/07/2019   Primary osteoarthritis of left hip 07/12/2017   Osteopenia of neck of femur 03/24/2017   Hyperlipidemia, mixed 03/17/2017   Low back pain with sciatica 04/08/2015   Colon polyp 09/10/2013    Myles Gip PT, DPT [phone]  01/15/2021, 4:38 PM  [facility] [address] [place], [place], [address] Phone: [phone]   Fax:  [phone]  Name: Lindsey Freeman MRN: [id number] Date of Birth: 10-21-1945

## 2021-01-22 ENCOUNTER — Encounter: Payer: Self-pay | Admitting: Physical Therapy

## 2021-01-22 ENCOUNTER — Ambulatory Visit: Payer: Medicare Other | Attending: Family Medicine | Admitting: Physical Therapy

## 2021-01-22 ENCOUNTER — Other Ambulatory Visit: Payer: Self-pay

## 2021-01-22 DIAGNOSIS — G8929 Other chronic pain: Secondary | ICD-10-CM | POA: Diagnosis present

## 2021-01-22 DIAGNOSIS — R293 Abnormal posture: Secondary | ICD-10-CM | POA: Diagnosis present

## 2021-01-22 DIAGNOSIS — M545 Low back pain, unspecified: Secondary | ICD-10-CM | POA: Diagnosis present

## 2021-01-22 NOTE — Therapy (Signed)
[place] [address] [facility] [address] [place], [place], [address] Phone: [phone]   Fax:  [phone]  Physical Therapy Treatment  Patient Details  Name: Lindsey Freeman MRN: [id number] Date of Birth: 08-19-1945 Referring Provider (PT): Meeler, Whitney   Encounter Date: 01/22/2021   PT End of Session - 01/22/21 1423     Visit Number 9    Number of Visits 16    Date for PT Re-Evaluation 03/12/21    PT Start Time [id number]    PT Stop Time 1455    PT Time Calculation (min) 40 min    Activity Tolerance Patient tolerated treatment well    Behavior During Therapy [facility] for tasks assessed/performed             History reviewed. No pertinent past medical history.  Past Surgical History:  Procedure Laterality Date   ABDOMINAL HYSTERECTOMY     TOTAL HIP ARTHROPLASTY Left 03/07/2019   Procedure: TOTAL HIP ARTHROPLASTY;  Surgeon: Dereck Leep, MD;  Location: [facility] ORS;  Service: Orthopedics;  Laterality: Left;    There were no vitals filed for this visit.   Subjective Assessment - 01/22/21 1417     Subjective Patient notes that she has been feeling pretty good. Patient notes that she was able to wash her kitchen floor this morning with breaks. She used rest and heat after the task and feels a bit stiff but not liek she would've prior to PT. Patient was also able to change sheets. Patient has started using balance disc in desk chair which has helped with posture and back pain.    Currently in Pain? Yes    Pain Score 6     Pain Location Back    Pain Orientation Left              TREATMENT  Manual Therapy: STM and TPR performed to L posterior hip and L lumbar region to allow for decreased tension and pain and improved posture and function L Sacral border mobilizations with movement for decreased spasm and improved mobility, grade II/III L superficial dorsal sacrococcygeal ligament MFR for decreased pain and spasm   Patient educated  throughout session on appropriate technique and form using multi-modal cueing, HEP, and activity modification. Patient articulated understanding and returned demonstration.  Patient Response to interventions: Comfortable to add seated abdominal strengthening to HEP  ASSESSMENT Patient presents to clinic with excellent motivation to participate in therapy. Patient demonstrates deficits in spinal mobility, posture, postural strength/endurance, and body mechanics. Patient had concordant pain with manual intervention at L sacrum which relieved with continued intervention during today's session and responded positively to manual interventions. Patient continues to make progress toward goal of improved function with pain controlled, and is applying principles and exercises appropriately in home practice. Patient will benefit from continued skilled therapeutic intervention to address remaining deficits in spinal mobility, posture, postural strength/endurance, and body mechanics in order to increase function and improve overall QOL.     PT Long Term Goals - 01/15/21 1418       PT LONG TERM GOAL #1   Title Patient will be independent with HEP in order to decrease back pain and increase strength in order to improve pain-free function at home and work.    Baseline IE: not initiated; 7/28: IND    Time 8    Period Weeks    Status Achieved    Target Date 01/15/21      PT LONG TERM GOAL #2  Title Patient will demonstrate improved function as evidenced by a score of 59 on FOTO measure for full participation in activities at home and in the community.    Baseline IE: 50; 7/28: 57    Time 8    Period Weeks    Status On-going    Target Date 03/12/21      PT LONG TERM GOAL #3   Title Patient will decrease worst pain as reported on NPRS by at least 2 points to demonstrate clinically significant reduction in pain in order to restore/improve function and overall QOL.    Baseline IE: 9/10; 7/28: AM 9/10, PM  6/10    Time 8    Period Weeks    Status On-going    Target Date 03/12/21      PT LONG TERM GOAL #4   Title Patient will be able to perform daily household chores including laundry, bed making, and walking the dog with pain controlled at or below 5/10 on NPRS scale for 24 hour period in order to improve function and QOL.    Baseline IE: 9/10 pain; 7/28: 7/10 pain    Time 8    Period Weeks    Status On-going    Target Date 03/12/21                   Plan - 01/22/21 1424     Clinical Impression Statement Patient presents to clinic with excellent motivation to participate in therapy. Patient demonstrates deficits in spinal mobility, posture, postural strength/endurance, and body mechanics. Patient had concordant pain with manual intervention at L sacrum which relieved with continued intervention during today's session and responded positively to manual interventions. Patient continues to make progress toward goal of improved function with pain controlled, and is applying principles and exercises appropriately in home practice. Patient will benefit from continued skilled therapeutic intervention to address remaining deficits in spinal mobility, posture, postural strength/endurance, and body mechanics in order to increase function and improve overall QOL.    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 stenosis, osteopenia, h/o of THA L, colon polyp    Examination-Activity Limitations Transfers;Squat;Bend;Lift;Locomotion Level;Carry;Reach Overhead;Stand    Examination-Participation Restrictions Laundry;Yard Work;Meal Prep;Shop;Cleaning    Stability/Clinical Decision Making Evolving/Moderate complexity    Rehab Potential Fair    PT Frequency 1x / week    PT Duration 8 weeks    PT Treatment/Interventions ADLs/Self Care Home  Management;Aquatic Therapy;Cryotherapy;Electrical Stimulation;Moist Heat;Therapeutic exercise;Neuromuscular re-education;Patient/family education;Manual techniques;Taping;Energy conservation;Scar mobilization;Orthotic Fit/Training    PT Next Visit Plan abdominal strengthening progression, manual, scar massage    PT Home Exercise Plan gentle lumbar stretches, mermaid stretch    Consulted and Agree with Plan of Care Patient             Patient will benefit from skilled therapeutic intervention in order to improve the following deficits and impairments:  Abnormal gait, Decreased endurance, Decreased mobility, Difficulty walking, Improper body mechanics, Decreased range of motion, Decreased activity tolerance, Decreased strength, Postural dysfunction, Pain  Visit Diagnosis: Abnormal posture  Chronic low back pain, unspecified back pain laterality, unspecified whether sciatica present     Problem List Patient Active Problem List   Diagnosis Date Noted   Eczema 03/07/2019   H/O total hip arthroplasty 03/07/2019   Primary osteoarthritis of left hip 07/12/2017   Osteopenia of neck of femur 03/24/2017   Hyperlipidemia, mixed 03/17/2017   Low back pain with sciatica 04/08/2015  Colon polyp 09/10/2013    Myles Gip PT, DPT [phone]  01/22/2021, 4:17 PM  [place] [address] [place], [place], [address] Phone: [phone]   Fax:  [phone]  Name: Lindsey Freeman MRN: [id number] Date of Birth: January 06, 1946

## 2021-01-29 ENCOUNTER — Encounter: Payer: Self-pay | Admitting: Physical Therapy

## 2021-01-29 ENCOUNTER — Ambulatory Visit: Payer: Medicare Other | Admitting: Physical Therapy

## 2021-01-29 ENCOUNTER — Other Ambulatory Visit: Payer: Self-pay

## 2021-01-29 DIAGNOSIS — G8929 Other chronic pain: Secondary | ICD-10-CM

## 2021-01-29 DIAGNOSIS — R293 Abnormal posture: Secondary | ICD-10-CM | POA: Diagnosis not present

## 2021-01-29 NOTE — Therapy (Signed)
[place] [facility] [address] [place], [place], [address] Phone: [phone]   Fax:  [phone]  Physical Therapy Treatment  Patient Details  Name: JAIDEE Freeman MRN: [id number] Date of Birth: [birth year] Referring Provider (PT): Meeler, Whitney   Encounter Date: 01/29/2021   PT End of Session - 01/29/21 1411     Visit Number 10    Number of Visits 16    Date for PT Re-Evaluation 03/12/21    Authorization Type PN 01/29/2021    Authorization Time Period IE 11/20/2020    PT Start Time 1415    PT Stop Time 1455    PT Time Calculation (min) 40 min    Activity Tolerance Patient tolerated treatment well    Behavior During Therapy [facility] for tasks assessed/performed             History reviewed. No pertinent past medical history.  Past Surgical History:  Procedure Laterality Date   ABDOMINAL HYSTERECTOMY     TOTAL HIP ARTHROPLASTY Left 03/07/2019   Procedure: TOTAL HIP ARTHROPLASTY;  Surgeon: Dereck Leep, MD;  Location: [facility] ORS;  Service: Orthopedics;  Laterality: Left;    There were no vitals filed for this visit.   Subjective Assessment - 01/29/21 1416     Subjective Patient notes that she is continuing manage caregiving for her mother. Patient has started to do AM stretches before getting out of bed; patient notes pain 10/10 on waking and stretches bring it down 8/10.    Currently in Pain? Yes    Pain Score 6     Pain Location Back    Pain Orientation Left             TREATMENT  Manual Therapy: STM and TPR performed to L posterior hip and L lumbar region to allow for decreased tension and pain and improved posture and function L Sacral border mobilizations with movement for decreased spasm and improved mobility, grade II/III L superficial dorsal sacrococcygeal ligament MFR for decreased pain and spasm Scar mobilization at transverse lower abdominal scar to allow for improved mobility and function  Neuromuscular  Re-education: Reviewed seated abdominal exercises.  Standing Pilates Postural Control (facing anchor)  Chest expansion  Serve a Tray  Patient educated throughout session on appropriate technique and form using multi-modal cueing, HEP, and activity modification. Patient articulated understanding and returned demonstration.  Patient Response to interventions: Comfortable to add standing abdominal strengthening to HEP  ASSESSMENT Patient presents to clinic with excellent motivation to participate in therapy. Patient demonstrates deficits in spinal mobility, posture, postural strength/endurance, and body mechanics. Patient able to sequence and coordinate standing Pilates exercises during today's session and responded positively to manual interventions. Patient will benefit from continued skilled therapeutic intervention to address remaining deficits in spinal mobility, posture, postural strength/endurance, and body mechanics in order to increase function and improve overall QOL.     PT Long Term Goals - 01/15/21 1418       PT LONG TERM GOAL #1   Title Patient will be independent with HEP in order to decrease back pain and increase strength in order to improve pain-free function at home and work.    Baseline IE: not initiated; 7/28: IND    Time 8    Period Weeks    Status Achieved    Target Date 01/15/21      PT LONG TERM GOAL #2   Title Patient will demonstrate improved function as evidenced by a score of 59 on  FOTO measure for full participation in activities at home and in the community.    Baseline IE: 50; 7/28: 57    Time 8    Period Weeks    Status On-going    Target Date 03/12/21      PT LONG TERM GOAL #3   Title Patient will decrease worst pain as reported on NPRS by at least 2 points to demonstrate clinically significant reduction in pain in order to restore/improve function and overall QOL.    Baseline IE: 9/10; 7/28: AM 9/10, PM 6/10    Time 8    Period Weeks    Status  On-going    Target Date 03/12/21      PT LONG TERM GOAL #4   Title Patient will be able to perform daily household chores including laundry, bed making, and walking the dog with pain controlled at or below 5/10 on NPRS scale for 24 hour period in order to improve function and QOL.    Baseline IE: 9/10 pain; 7/28: 7/10 pain    Time 8    Period Weeks    Status On-going    Target Date 03/12/21                   Plan - 01/29/21 1412     Clinical Impression Statement Patient presents to clinic with excellent motivation to participate in therapy. Patient demonstrates deficits in spinal mobility, posture, postural strength/endurance, and body mechanics. Patient able to sequence and coordinate standing Pilates exercises during today's session and responded positively to manual interventions. Patient will benefit from continued skilled therapeutic intervention to address remaining deficits in spinal mobility, posture, postural strength/endurance, and body mechanics in order to increase function and improve overall QOL.    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 stenosis, osteopenia, h/o of THA L, colon polyp    Examination-Activity Limitations Transfers;Squat;Bend;Lift;Locomotion Level;Carry;Reach Overhead;Stand    Examination-Participation Restrictions Laundry;Yard Work;Meal Prep;Shop;Cleaning    Stability/Clinical Decision Making Evolving/Moderate complexity    Rehab Potential Fair    PT Frequency 1x / week    PT Duration 8 weeks    PT Treatment/Interventions ADLs/Self Care Home Management;Aquatic Therapy;Cryotherapy;Electrical Stimulation;Moist Heat;Therapeutic exercise;Neuromuscular re-education;Patient/family education;Manual techniques;Taping;Energy conservation;Scar mobilization;Orthotic Fit/Training    PT Next Visit Plan abdominal  strengthening progression, manual, scar massage    PT Home Exercise Plan gentle lumbar stretches, mermaid stretch    Consulted and Agree with Plan of Care Patient             Patient will benefit from skilled therapeutic intervention in order to improve the following deficits and impairments:  Abnormal gait, Decreased endurance, Decreased mobility, Difficulty walking, Improper body mechanics, Decreased range of motion, Decreased activity tolerance, Decreased strength, Postural dysfunction, Pain  Visit Diagnosis: Abnormal posture  Chronic low back pain, unspecified back pain laterality, unspecified whether sciatica present     Problem List Patient Active Problem List   Diagnosis Date Noted   Eczema 03/07/2019   H/O total hip arthroplasty 03/07/2019   Primary osteoarthritis of left hip 07/12/2017   Osteopenia of neck of femur 03/24/2017   Hyperlipidemia, mixed 03/17/2017   Low back pain with sciatica 04/08/2015   Colon polyp 09/10/2013    Myles Gip PT, DPT [phone]  01/29/2021, 5:04 PM   [facility] [address] [place], [place], [address] Phone: [phone]   Fax:  [phone]  Name: Ramond Marrow  Hulen MRN: [id number] Date of Birth: 07/15/1945

## 2021-02-05 ENCOUNTER — Ambulatory Visit: Payer: Medicare Other | Admitting: Physical Therapy

## 2021-02-11 ENCOUNTER — Other Ambulatory Visit: Payer: Self-pay

## 2021-02-11 ENCOUNTER — Encounter: Payer: Self-pay | Admitting: Physical Therapy

## 2021-02-11 ENCOUNTER — Ambulatory Visit: Payer: Medicare Other | Admitting: Physical Therapy

## 2021-02-11 DIAGNOSIS — R293 Abnormal posture: Secondary | ICD-10-CM | POA: Diagnosis not present

## 2021-02-11 DIAGNOSIS — G8929 Other chronic pain: Secondary | ICD-10-CM

## 2021-02-11 DIAGNOSIS — M545 Low back pain, unspecified: Secondary | ICD-10-CM

## 2021-02-11 NOTE — Therapy (Signed)
[place] [facility] [address] [place], [place], [address] Phone: [phone]   Fax:  [phone]  Physical Therapy Treatment  Patient Details  Name: Lindsey Freeman MRN: [id number] Date of Birth: [birth year] Referring Provider (PT): Meeler, Whitney   Encounter Date: 02/11/2021   PT End of Session - 02/11/21 1424     Visit Number 11    Number of Visits 16    Date for PT Re-Evaluation 03/12/21    Authorization Type PN 01/29/2021    Authorization Time Period IE 11/20/2020    PT Start Time 1415    PT Stop Time 1455    PT Time Calculation (min) 40 min    Activity Tolerance Patient tolerated treatment well    Behavior During Therapy [facility] for tasks assessed/performed             History reviewed. No pertinent past medical history.  Past Surgical History:  Procedure Laterality Date   ABDOMINAL HYSTERECTOMY     TOTAL HIP ARTHROPLASTY Left 03/07/2019   Procedure: TOTAL HIP ARTHROPLASTY;  Surgeon: Dereck Leep, MD;  Location: [facility] ORS;  Service: Orthopedics;  Laterality: Left;    There were no vitals filed for this visit.   Subjective Assessment - 02/11/21 1418     Subjective Patient has had a stressful couple weeks with caregiving for her mother. Patient had to recently trot/jog to catch a loose dog which exacerbated symptoms. Patient notes she continues to receive benefit from morning stretches. Is eager to participate in accupuncture evaluation/consultation later this week.    Currently in Pain? Yes    Pain Score 8     Pain Location Sacrum    Pain Orientation Left             TREATMENT  Manual Therapy: STM and TPR performed to L posterior hip, L hamstring, and L lumbar region to allow for decreased tension and pain and improved posture and function L Sacral border mobilizations with movement for decreased spasm and improved mobility, grade II/III L superficial dorsal sacrococcygeal ligament MFR for decreased pain and spasm L  innominate posterior mobilizations for decreased pain and improved mobility  Patient educated throughout session on appropriate technique and form using multi-modal cueing, HEP, and activity modification. Patient articulated understanding and returned demonstration.  Patient Response to interventions: 6/10  ASSESSMENT Patient presents to clinic with excellent motivation to participate in therapy. Patient demonstrates deficits in spinal mobility, posture, postural strength/endurance, and body mechanics. Patient had clinically significant reduction in pain with manual interventions during today's session and continues to respond positively to postural HEP interventions. Patient will benefit from continued skilled therapeutic intervention to address remaining deficits in spinal mobility, posture, postural strength/endurance, and body mechanics in order to increase function and improve overall QOL.     PT Long Term Goals - 01/15/21 1418       PT LONG TERM GOAL #1   Title Patient will be independent with HEP in order to decrease back pain and increase strength in order to improve pain-free function at home and work.    Baseline IE: not initiated; 7/28: IND    Time 8    Period Weeks    Status Achieved    Target Date 01/15/21      PT LONG TERM GOAL #2   Title Patient will demonstrate improved function as evidenced by a score of 59 on FOTO measure for full participation in activities at home and in the community.  Baseline IE: 50; 7/28: 57    Time 8    Period Weeks    Status On-going    Target Date 03/12/21      PT LONG TERM GOAL #3   Title Patient will decrease worst pain as reported on NPRS by at least 2 points to demonstrate clinically significant reduction in pain in order to restore/improve function and overall QOL.    Baseline IE: 9/10; 7/28: AM 9/10, PM 6/10    Time 8    Period Weeks    Status On-going    Target Date 03/12/21      PT LONG TERM GOAL #4   Title Patient will be  able to perform daily household chores including laundry, bed making, and walking the dog with pain controlled at or below 5/10 on NPRS scale for 24 hour period in order to improve function and QOL.    Baseline IE: 9/10 pain; 7/28: 7/10 pain    Time 8    Period Weeks    Status On-going    Target Date 03/12/21                   Plan - 02/11/21 1425     Clinical Impression Statement Patient presents to clinic with excellent motivation to participate in therapy. Patient demonstrates deficits in spinal mobility, posture, postural strength/endurance, and body mechanics. Patient had clinically significant reduction in pain with manual interventions during today's session and continues to respond positively to postural HEP interventions. Patient will benefit from continued skilled therapeutic intervention to address remaining deficits in spinal mobility, posture, postural strength/endurance, and body mechanics in order to increase function and improve overall QOL.    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 stenosis, osteopenia, h/o of THA L, colon polyp    Examination-Activity Limitations Transfers;Squat;Bend;Lift;Locomotion Level;Carry;Reach Overhead;Stand    Examination-Participation Restrictions Laundry;Yard Work;Meal Prep;Shop;Cleaning    Stability/Clinical Decision Making Evolving/Moderate complexity    Rehab Potential Fair    PT Frequency 1x / week    PT Duration 8 weeks    PT Treatment/Interventions ADLs/Self Care Home Management;Aquatic Therapy;Cryotherapy;Electrical Stimulation;Moist Heat;Therapeutic exercise;Neuromuscular re-education;Patient/family education;Manual techniques;Taping;Energy conservation;Scar mobilization;Orthotic Fit/Training    PT Next Visit Plan abdominal strengthening progression, manual, scar massage    PT Home  Exercise Plan gentle lumbar stretches, mermaid stretch    Consulted and Agree with Plan of Care Patient             Patient will benefit from skilled therapeutic intervention in order to improve the following deficits and impairments:  Abnormal gait, Decreased endurance, Decreased mobility, Difficulty walking, Improper body mechanics, Decreased range of motion, Decreased activity tolerance, Decreased strength, Postural dysfunction, Pain  Visit Diagnosis: Abnormal posture  Chronic low back pain, unspecified back pain laterality, unspecified whether sciatica present     Problem List Patient Active Problem List   Diagnosis Date Noted   Eczema 03/07/2019   H/O total hip arthroplasty 03/07/2019   Primary osteoarthritis of left hip 07/12/2017   Osteopenia of neck of femur 03/24/2017   Hyperlipidemia, mixed 03/17/2017   Low back pain with sciatica 04/08/2015   Colon polyp 09/10/2013    Myles Gip PT, DPT [phone]  02/11/2021, 5:30 PM  [place] [facility] [address] [place], [place], [address] Phone: [phone]   Fax:  [phone]  Name: Lindsey Freeman MRN: [id number] Date of Birth: 10-23-1945

## 2021-02-12 ENCOUNTER — Encounter: Payer: Self-pay | Admitting: Physical Therapy

## 2021-02-18 ENCOUNTER — Ambulatory Visit: Payer: Medicare Other | Admitting: Physical Therapy

## 2021-02-18 ENCOUNTER — Encounter: Payer: Self-pay | Admitting: Physical Therapy

## 2021-02-18 ENCOUNTER — Other Ambulatory Visit: Payer: Self-pay

## 2021-02-18 DIAGNOSIS — R293 Abnormal posture: Secondary | ICD-10-CM

## 2021-02-18 DIAGNOSIS — G8929 Other chronic pain: Secondary | ICD-10-CM

## 2021-02-18 DIAGNOSIS — M545 Low back pain, unspecified: Secondary | ICD-10-CM

## 2021-02-18 NOTE — Therapy (Signed)
[facility] [address] [place], [place], [address] Phone: [phone]   Fax:  [phone]  Physical Therapy Treatment  Patient Details  Name: Lindsey Freeman MRN: [id number] Date of Birth: 07/06/1945 Referring Provider (PT): Meeler, Whitney   Encounter Date: 02/18/2021   PT End of Session - 02/18/21 1424     Visit Number 12    Number of Visits 16    Date for PT Re-Evaluation 03/12/21    Authorization Type PN 01/29/2021    Authorization Time Period IE 11/20/2020    PT Start Time 1412    PT Stop Time 1455    PT Time Calculation (min) 43 min    Activity Tolerance Patient tolerated treatment well    Behavior During Therapy [facility] for tasks assessed/performed             History reviewed. No pertinent past medical history.  Past Surgical History:  Procedure Laterality Date   ABDOMINAL HYSTERECTOMY     TOTAL HIP ARTHROPLASTY Left 03/07/2019   Procedure: TOTAL HIP ARTHROPLASTY;  Surgeon: Dereck Leep, MD;  Location: [facility] ORS;  Service: Orthopedics;  Laterality: Left;    There were no vitals filed for this visit.   Subjective Assessment - 02/18/21 1415     Subjective Patient notes that she is continuing to work on relieving family stressors. She had positive visit with accupuncturist but is awaiting insurance approval. Patient has epidural injection scheduled for tomorrow. Patient has continued to work on HEP and is progressing with abdominal exercise.    Currently in Pain? Yes    Pain Score 7     Pain Location Sacrum    Pain Orientation Left             TREATMENT  Manual Therapy: STM and TPR performed to L posterior hip, L hamstring, and L lumbar region to allow for decreased tension and pain and improved posture and function L Sacral border mobilizations with movement for decreased spasm and improved mobility, grade II/III L superficial dorsal sacrococcygeal ligament MFR for decreased pain and spasm L innominate  posterior mobilizations for decreased pain and improved mobility L LAD in sidelying for improved pain and mobility  Neuromuscular Re-education: Seated pelvic tilts for improved mobility and pain Sidelying diaphragmatic breathing for nervous system downregulation  Patient educated throughout session on appropriate technique and form using multi-modal cueing, HEP, and activity modification. Patient articulated understanding and returned demonstration.  Patient Response to interventions: Comfortable to return in 1 week  ASSESSMENT Patient presents to clinic with excellent motivation to participate in therapy. Patient demonstrates deficits in spinal mobility, posture, postural strength/endurance, and body mechanics. Patient had improved pain response with addition of L sidelying LAD technique during today's session and continues to respond positively to manual interventions. Patient will benefit from continued skilled therapeutic intervention to address remaining deficits in spinal mobility, posture, postural strength/endurance, and body mechanics in order to increase function and improve overall QOL.     PT Long Term Goals - 01/15/21 1418       PT LONG TERM GOAL #1   Title Patient will be independent with HEP in order to decrease back pain and increase strength in order to improve pain-free function at home and work.    Baseline IE: not initiated; 7/28: IND    Time 8    Period Weeks    Status Achieved    Target Date 01/15/21      PT LONG TERM GOAL #2  Title Patient will demonstrate improved function as evidenced by a score of 59 on FOTO measure for full participation in activities at home and in the community.    Baseline IE: 50; 7/28: 57    Time 8    Period Weeks    Status On-going    Target Date 03/12/21      PT LONG TERM GOAL #3   Title Patient will decrease worst pain as reported on NPRS by at least 2 points to demonstrate clinically significant reduction in pain in order to  restore/improve function and overall QOL.    Baseline IE: 9/10; 7/28: AM 9/10, PM 6/10    Time 8    Period Weeks    Status On-going    Target Date 03/12/21      PT LONG TERM GOAL #4   Title Patient will be able to perform daily household chores including laundry, bed making, and walking the dog with pain controlled at or below 5/10 on NPRS scale for 24 hour period in order to improve function and QOL.    Baseline IE: 9/10 pain; 7/28: 7/10 pain    Time 8    Period Weeks    Status On-going    Target Date 03/12/21                   Plan - 02/18/21 1424     Clinical Impression Statement Patient presents to clinic with excellent motivation to participate in therapy. Patient demonstrates deficits in spinal mobility, posture, postural strength/endurance, and body mechanics. Patient had improved pain response with addition of L sidelying LAD technique during today's session and continues to respond positively to manual interventions. Patient will benefit from continued skilled therapeutic intervention to address remaining deficits in spinal mobility, posture, postural strength/endurance, and body mechanics in order to increase function and improve overall QOL.    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 stenosis, osteopenia, h/o of THA L, colon polyp    Examination-Activity Limitations Transfers;Squat;Bend;Lift;Locomotion Level;Carry;Reach Overhead;Stand    Examination-Participation Restrictions Laundry;Yard Work;Meal Prep;Shop;Cleaning    Stability/Clinical Decision Making Evolving/Moderate complexity    Rehab Potential Fair    PT Frequency 1x / week    PT Duration 8 weeks    PT Treatment/Interventions ADLs/Self Care Home Management;Aquatic Therapy;Cryotherapy;Electrical Stimulation;Moist Heat;Therapeutic exercise;Neuromuscular  re-education;Patient/family education;Manual techniques;Taping;Energy conservation;Scar mobilization;Orthotic Fit/Training    PT Next Visit Plan abdominal strengthening progression, manual, scar massage    PT Home Exercise Plan gentle lumbar stretches, mermaid stretch    Consulted and Agree with Plan of Care Patient             Patient will benefit from skilled therapeutic intervention in order to improve the following deficits and impairments:  Abnormal gait, Decreased endurance, Decreased mobility, Difficulty walking, Improper body mechanics, Decreased range of motion, Decreased activity tolerance, Decreased strength, Postural dysfunction, Pain  Visit Diagnosis: Abnormal posture  Chronic low back pain, unspecified back pain laterality, unspecified whether sciatica present     Problem List Patient Active Problem List   Diagnosis Date Noted   Eczema 03/07/2019   H/O total hip arthroplasty 03/07/2019   Primary osteoarthritis of left hip 07/12/2017   Osteopenia of neck of femur 03/24/2017   Hyperlipidemia, mixed 03/17/2017   Low back pain with sciatica 04/08/2015   Colon polyp 09/10/2013    Myles Gip PT, DPT [phone]  02/18/2021, 3:09 PM  [place]  [facility] [address] [place], [place], [address] Phone: [phone]   Fax:  [phone]  Name: Lindsey Freeman MRN: [id number] Date of Birth: 1946-03-21

## 2021-02-25 ENCOUNTER — Ambulatory Visit: Payer: Medicare Other | Attending: Family Medicine | Admitting: Physical Therapy

## 2021-02-25 ENCOUNTER — Other Ambulatory Visit: Payer: Self-pay

## 2021-02-25 ENCOUNTER — Encounter: Payer: Self-pay | Admitting: Physical Therapy

## 2021-02-25 DIAGNOSIS — M545 Low back pain, unspecified: Secondary | ICD-10-CM | POA: Diagnosis present

## 2021-02-25 DIAGNOSIS — G8929 Other chronic pain: Secondary | ICD-10-CM | POA: Insufficient documentation

## 2021-02-25 DIAGNOSIS — R293 Abnormal posture: Secondary | ICD-10-CM | POA: Diagnosis not present

## 2021-02-26 NOTE — Therapy (Signed)
[facility] [address] [place], [place], [address] Phone: [phone]   Fax:  [phone]  Physical Therapy Treatment  Patient Details  Name: Lindsey Freeman MRN: [id number] Date of Birth: 1945/08/21 Referring Provider (PT): Meeler, Whitney   Encounter Date: 02/25/2021   PT End of Session - 02/26/21 0927     Visit Number 13    Number of Visits 16    Date for PT Re-Evaluation 03/12/21    Authorization Type PN 01/29/2021    Authorization Time Period IE 11/20/2020    PT Start Time 1500    PT Stop Time 1545    PT Time Calculation (min) 45 min    Activity Tolerance Patient tolerated treatment well    Behavior During Therapy [facility] for tasks assessed/performed             History reviewed. No pertinent past medical history.  Past Surgical History:  Procedure Laterality Date   ABDOMINAL HYSTERECTOMY     TOTAL HIP ARTHROPLASTY Left 03/07/2019   Procedure: TOTAL HIP ARTHROPLASTY;  Surgeon: Dereck Leep, MD;  Location: [facility] ORS;  Service: Orthopedics;  Laterality: Left;    There were no vitals filed for this visit.   Subjective Assessment - 02/25/21 1503     Subjective Patient notes continued stress, but was able to shop before coming and is still feeling okay.    Currently in Pain? Yes    Pain Score 7     Pain Location Sacrum    Pain Orientation Left              TREATMENT  Manual Therapy: STM and TPR performed to L posterior hip, L hamstring, and L lumbar region to allow for decreased tension and pain and improved posture and function L Sacral border mobilizations with movement for decreased spasm and improved mobility, grade II/III L superficial dorsal sacrococcygeal ligament MFR for decreased pain and spasm L innominate posterior mobilizations for decreased pain and improved mobility  Neuromuscular Re-education: Sidelying diaphragmatic breathing for nervous system downregulation Sidelying L hip flexor stretch for  improved postural symmetry, PT assisted Sidelying L clamshell with TrA activation for improved postural awareness and control as well as innominate mobility  Patient educated throughout session on appropriate technique and form using multi-modal cueing, HEP, and activity modification. Patient articulated understanding and returned demonstration.  Patient Response to interventions: Comfortable to return in 1 week  ASSESSMENT Patient presents to clinic with excellent motivation to participate in therapy. Patient demonstrates deficits in spinal mobility, posture, postural strength/endurance, and body mechanics. Patient able to coordinate sidelying L hip ER with TrA activation for improved force closure of SIJ during today's session and continues to respond positively to manual interventions. Patient will benefit from continued skilled therapeutic intervention to address remaining deficits in spinal mobility, posture, postural strength/endurance, and body mechanics in order to increase function and improve overall QOL.     PT Long Term Goals - 01/15/21 1418       PT LONG TERM GOAL #1   Title Patient will be independent with HEP in order to decrease back pain and increase strength in order to improve pain-free function at home and work.    Baseline IE: not initiated; 7/28: IND    Time 8    Period Weeks    Status Achieved    Target Date 01/15/21      PT LONG TERM GOAL #2   Title Patient will demonstrate improved function as evidenced by  a score of 59 on FOTO measure for full participation in activities at home and in the community.    Baseline IE: 50; 7/28: 57    Time 8    Period Weeks    Status On-going    Target Date 03/12/21      PT LONG TERM GOAL #3   Title Patient will decrease worst pain as reported on NPRS by at least 2 points to demonstrate clinically significant reduction in pain in order to restore/improve function and overall QOL.    Baseline IE: 9/10; 7/28: AM 9/10, PM 6/10     Time 8    Period Weeks    Status On-going    Target Date 03/12/21      PT LONG TERM GOAL #4   Title Patient will be able to perform daily household chores including laundry, bed making, and walking the dog with pain controlled at or below 5/10 on NPRS scale for 24 hour period in order to improve function and QOL.    Baseline IE: 9/10 pain; 7/28: 7/10 pain    Time 8    Period Weeks    Status On-going    Target Date 03/12/21                   Plan - 02/26/21 0930     Clinical Impression Statement Patient presents to clinic with excellent motivation to participate in therapy. Patient demonstrates deficits in spinal mobility, posture, postural strength/endurance, and body mechanics. Patient able to coordinate sidelying L hip ER with TrA activation for improved force closure of SIJ during today's session and continues to respond positively to manual interventions. Patient will benefit from continued skilled therapeutic intervention to address remaining deficits in spinal mobility, posture, postural strength/endurance, and body mechanics in order to increase function and improve overall QOL.    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 stenosis, osteopenia, h/o of THA L, colon polyp    Examination-Activity Limitations Transfers;Squat;Bend;Lift;Locomotion Level;Carry;Reach Overhead;Stand    Examination-Participation Restrictions Laundry;Yard Work;Meal Prep;Shop;Cleaning    Stability/Clinical Decision Making Evolving/Moderate complexity    Rehab Potential Fair    PT Frequency 1x / week    PT Duration 8 weeks    PT Treatment/Interventions ADLs/Self Care Home Management;Aquatic Therapy;Cryotherapy;Electrical Stimulation;Moist Heat;Therapeutic exercise;Neuromuscular re-education;Patient/family education;Manual techniques;Taping;Energy  conservation;Scar mobilization;Orthotic Fit/Training    PT Next Visit Plan abdominal strengthening progression, manual, scar massage    PT Home Exercise Plan gentle lumbar stretches, mermaid stretch    Consulted and Agree with Plan of Care Patient             Patient will benefit from skilled therapeutic intervention in order to improve the following deficits and impairments:  Abnormal gait, Decreased endurance, Decreased mobility, Difficulty walking, Improper body mechanics, Decreased range of motion, Decreased activity tolerance, Decreased strength, Postural dysfunction, Pain  Visit Diagnosis: Abnormal posture  Chronic low back pain, unspecified back pain laterality, unspecified whether sciatica present     Problem List Patient Active Problem List   Diagnosis Date Noted   Eczema 03/07/2019   H/O total hip arthroplasty 03/07/2019   Primary osteoarthritis of left hip 07/12/2017   Osteopenia of neck of femur 03/24/2017   Hyperlipidemia, mixed 03/17/2017   Low back pain with sciatica 04/08/2015   Colon polyp 09/10/2013    Myles Gip PT, DPT [phone]  02/26/2021, 9:48 AM  [place]  [address] [place], [place], [address] Phone: [phone]   Fax:  [phone]  Name: Lindsey Freeman MRN: [id number] Date of Birth: 1945-07-13

## 2021-03-02 ENCOUNTER — Encounter: Payer: Self-pay | Admitting: *Deleted

## 2021-03-03 ENCOUNTER — Encounter: Payer: Self-pay | Admitting: Anesthesiology

## 2021-03-03 ENCOUNTER — Ambulatory Visit
Admission: RE | Admit: 2021-03-03 | Discharge: 2021-03-03 | Disposition: A | Discharge status: Home / Self Care | Payer: Medicare Other | Attending: Gastroenterology | Admitting: Gastroenterology

## 2021-03-03 ENCOUNTER — Ambulatory Visit: Payer: Medicare Other | Admitting: Anesthesiology

## 2021-03-03 ENCOUNTER — Encounter
Admission: RE | Disposition: A | Discharge status: Home / Self Care | Payer: Self-pay | Source: Home / Self Care | Attending: Gastroenterology

## 2021-03-03 DIAGNOSIS — Z79899 Other long term (current) drug therapy: Secondary | ICD-10-CM | POA: Diagnosis not present

## 2021-03-03 DIAGNOSIS — E785 Hyperlipidemia, unspecified: Secondary | ICD-10-CM | POA: Diagnosis not present

## 2021-03-03 DIAGNOSIS — Z7952 Long term (current) use of systemic steroids: Secondary | ICD-10-CM | POA: Insufficient documentation

## 2021-03-03 DIAGNOSIS — D124 Benign neoplasm of descending colon: Secondary | ICD-10-CM | POA: Insufficient documentation

## 2021-03-03 DIAGNOSIS — Z7901 Long term (current) use of anticoagulants: Secondary | ICD-10-CM | POA: Insufficient documentation

## 2021-03-03 DIAGNOSIS — Z1211 Encounter for screening for malignant neoplasm of colon: Secondary | ICD-10-CM | POA: Diagnosis present

## 2021-03-03 DIAGNOSIS — K640 First degree hemorrhoids: Secondary | ICD-10-CM | POA: Insufficient documentation

## 2021-03-03 DIAGNOSIS — Z791 Long term (current) use of non-steroidal anti-inflammatories (NSAID): Secondary | ICD-10-CM | POA: Diagnosis not present

## 2021-03-03 HISTORY — DX: Dorsalgia, unspecified: M54.9

## 2021-03-03 HISTORY — PX: COLONOSCOPY WITH PROPOFOL: SHX5780

## 2021-03-03 SURGERY — COLONOSCOPY WITH PROPOFOL
Anesthesia: General

## 2021-03-03 MED ORDER — PROPOFOL 500 MG/50ML IV EMUL
INTRAVENOUS | Status: AC
  Filled 2021-03-03: qty 50

## 2021-03-03 MED ORDER — PROPOFOL 500 MG/50ML IV EMUL
INTRAVENOUS | Status: DC | PRN
  Administered 2021-03-03: 150 ug/kg/min via INTRAVENOUS

## 2021-03-03 MED ORDER — SODIUM CHLORIDE 0.9 % IV SOLN: INTRAVENOUS | Status: DC

## 2021-03-03 NOTE — Anesthesia Preprocedure Evaluation (Signed)
Anesthesia Evaluation  Patient identified by MRN, date of birth, ID band Patient awake    Reviewed: Allergy & Precautions, NPO status , Patient's Chart, lab work & pertinent test results  Airway Mallampati: III  TM Distance: >3 FB Neck ROM: full    Dental no notable dental hx.    Pulmonary neg pulmonary ROS,    Pulmonary exam normal        Cardiovascular negative cardio ROS Normal cardiovascular exam     Neuro/Psych negative neurological ROS  negative psych ROS   GI/Hepatic negative GI ROS, Neg liver ROS,   Endo/Other  negative endocrine ROS  Renal/GU negative Renal ROS  negative genitourinary   Musculoskeletal   Abdominal Normal abdominal exam  (+)   Peds  Hematology negative hematology ROS (+)   Anesthesia Other Findings History reviewed. No pertinent past medical history.  Past Surgical History: No date: ABDOMINAL HYSTERECTOMY 03/07/2019: TOTAL HIP ARTHROPLASTY; Left     Comment:  Procedure: TOTAL HIP ARTHROPLASTY;  Surgeon: Dereck Leep, MD;  Location: [facility] ORS;  Service: Orthopedics;               Laterality: Left;     Reproductive/Obstetrics negative OB ROS                             Anesthesia Physical Anesthesia Plan  ASA: 2  Anesthesia Plan: General   Post-op Pain Management:    Induction: Intravenous  PONV Risk Score and Plan: Propofol infusion and TIVA  Airway Management Planned: Natural Airway and Nasal Cannula  Additional Equipment:   Intra-op Plan:   Post-operative Plan:   Informed Consent: I have reviewed the patients History and Physical, chart, labs and discussed the procedure including the risks, benefits and alternatives for the proposed anesthesia with the patient or authorized representative who has indicated his/her understanding and acceptance.     Dental Advisory Given  Plan Discussed with: Anesthesiologist, CRNA and  Surgeon  Anesthesia Plan Comments: (Patient consented for risks of anesthesia including but not limited to:  - adverse reactions to medications - risk of airway placement if required - damage to eyes, teeth, lips or other oral mucosa - nerve damage due to positioning  - sore throat or hoarseness - Damage to heart, brain, nerves, lungs, other parts of body or loss of life  Patient voiced understanding.)        Anesthesia Quick Evaluation

## 2021-03-03 NOTE — Anesthesia Procedure Notes (Signed)
Date/Time: 03/03/2021 12:56 PM Performed by: Vaughan Sine Pre-anesthesia Checklist: Patient identified, Emergency Drugs available, Suction available, Patient being monitored and Timeout performed Patient Re-evaluated:Patient Re-evaluated prior to induction Oxygen Delivery Method: Nasal cannula Preoxygenation: Pre-oxygenation with 100% oxygen Induction Type: IV induction Placement Confirmation: positive ETCO2 and CO2 detector

## 2021-03-03 NOTE — Transfer of Care (Signed)
Immediate Anesthesia Transfer of Care Note  Patient: Lindsey Freeman  Procedure(s) Performed: COLONOSCOPY WITH PROPOFOL  Patient Location: PACU  Anesthesia Type:General  Level of Consciousness: awake and sedated  Airway & Oxygen Therapy: Patient Spontanous Breathing and Patient connected to nasal cannula oxygen  Post-op Assessment: Report given to RN and Post -op Vital signs reviewed and stable  Post vital signs: Reviewed and stable  Last Vitals:  Vitals Value Taken Time  BP    Temp    Pulse    Resp    SpO2      Last Pain:  Vitals:   03/03/21 1159  TempSrc: Temporal  PainSc:          Complications: No notable events documented.

## 2021-03-03 NOTE — Op Note (Signed)
[facility] Patient Name: Lindsey Freeman Procedure Date: 03/03/2021 12:52 PM MRN: [id number] Account #: [id number] Date of Birth: 10-14-1945 Admit Type: Outpatient Age: 75 Room: [facility] ENDO ROOM 1 Gender: Female Note Status: Finalized Instrument Name: Jasper Riling [id number] Procedure:             Colonoscopy Indications:           Surveillance: Personal history of adenomatous polyps                         on last colonoscopy 5 years ago Providers:             Andrey Farmer MD, MD Referring MD:          [name] MD, MD (Referring MD) Medicines:             Monitored Anesthesia Care Complications:         No immediate complications. Estimated blood loss:                         Minimal. Procedure:             Pre-Anesthesia Assessment:                        - Prior to the procedure, a History and Physical was                         performed, and patient medications and allergies were                         reviewed. The patient is competent. The risks and                         benefits of the procedure and the sedation options and                         risks were discussed with the patient. All questions                         were answered and informed consent was obtained.                         Patient identification and proposed procedure were                         verified by the physician, the nurse, the anesthetist                         and the technician in the endoscopy suite. Mental                         Status Examination: alert and oriented. Airway                         Examination: normal oropharyngeal airway and neck                         mobility. Respiratory Examination: clear to  auscultation. CV Examination: normal. Prophylactic                         Antibiotics: The patient does not require prophylactic                         antibiotics. Prior Anticoagulants: The patient has                          taken no previous anticoagulant or antiplatelet                         agents. ASA Grade Assessment: II - A patient with mild                         systemic disease. After reviewing the risks and                         benefits, the patient was deemed in satisfactory                         condition to undergo the procedure. The anesthesia                         plan was to use monitored anesthesia care (MAC).                         Immediately prior to administration of medications,                         the patient was re-assessed for adequacy to receive                         sedatives. The heart rate, respiratory rate, oxygen                         saturations, blood pressure, adequacy of pulmonary                         ventilation, and response to care were monitored                         throughout the procedure. The physical status of the                         patient was re-assessed after the procedure.                        After obtaining informed consent, the colonoscope was                         passed under direct vision. Throughout the procedure,                         the patient's blood pressure, pulse, and oxygen                         saturations were monitored continuously. The  Colonoscope was introduced through the anus and                         advanced to the the cecum, identified by appendiceal                         orifice and ileocecal valve. The colonoscopy was                         performed without difficulty. The patient tolerated                         the procedure well. The quality of the bowel                         preparation was good. Findings:      The perianal and digital rectal examinations were normal.      A 1 mm polyp was found in the descending colon. The polyp was sessile.       The polyp was removed with a jumbo cold forceps. Resection and retrieval       were complete.  Estimated blood loss was minimal.      Internal hemorrhoids were found during retroflexion. The hemorrhoids       were Grade I (internal hemorrhoids that do not prolapse).      The exam was otherwise without abnormality on direct and retroflexion       views. Impression:            - One 1 mm polyp in the descending colon, removed with                         a jumbo cold forceps. Resected and retrieved.                        - Internal hemorrhoids.                        - The examination was otherwise normal on direct and                         retroflexion views. Recommendation:        - Discharge patient to home.                        - Resume previous diet.                        - Continue present medications.                        - Await pathology results.                        - Repeat colonoscopy is not recommended due to current                         age (85 years or older) for surveillance.                        - Return to referring physician as previously  scheduled. Procedure Code(s):     --- Professional ---                        [phone], Colonoscopy, flexible; with biopsy, single or                         multiple Diagnosis Code(s):     --- Professional ---                        Z86.010, Personal history of colonic polyps                        K63.5, Polyp of colon                        K64.0, First degree hemorrhoids CPT copyright 2019 [employer]. All rights reserved. The codes documented in this report are preliminary and upon coder review may  be revised to meet current compliance requirements. Andrey Farmer MD, MD 03/03/2021 1:14:16 PM Number of Addenda: 0 Note Initiated On: 03/03/2021 12:52 PM Scope Withdrawal Time: 0 hours 8 minutes 6 seconds  Total Procedure Duration: 0 hours 13 minutes 10 seconds  Estimated Blood Loss:  Estimated blood loss was minimal.      [facility]

## 2021-03-03 NOTE — Interval H&P Note (Signed)
History and Physical Interval Note:  03/03/2021 12:53 PM  Lindsey Freeman  has presented today for surgery, with the diagnosis of screening.  The various methods of treatment have been discussed with the patient and family. After consideration of risks, benefits and other options for treatment, the patient has consented to  Procedure(s): COLONOSCOPY WITH PROPOFOL (N/A) as a surgical intervention.  The patient's history has been reviewed, patient examined, no change in status, stable for surgery.  I have reviewed the patient's chart and labs.  Questions were answered to the patient's satisfaction.     Lesly Rubenstein  Ok to proceed with colonoscopy

## 2021-03-03 NOTE — H&P (Signed)
Outpatient short stay form Pre-procedure 03/03/2021  Lindsey Rubenstein, MD  Primary Physician: [name], MD  Reason for visit:  Surveillance colonoscopy  History of present illness:   75 y/o lady with history of HLD and history of TA on colonoscopy done 5 years ago here for surveillance colonoscopy. History of hysterectomy. No blood thinners. No family history of GI malignancies.    Current Facility-Administered Medications:    0.9 %  sodium chloride infusion, , Intravenous, Continuous, Kriste Broman, Hilton Cork, MD, Last Rate: 20 mL/hr at 03/03/21 1234, Continued from Pre-op at 03/03/21 1234  Medications Prior to Admission  Medication Sig Dispense Refill Last Dose   acetaminophen (TYLENOL) 500 MG tablet Take 500 mg by mouth every 6 (six) hours as needed.   03/01/2021   cyclobenzaprine (FLEXERIL) 10 MG tablet Take 10 mg by mouth 3 (three) times daily as needed for muscle spasms.   Past Month   fluticasone (FLONASE SENSIMIST) 27.5 MCG/SPRAY nasal spray Place 1 spray into the nose every evening.   03/02/2021   gabapentin (NEURONTIN) 100 MG capsule Take 100 mg by mouth 3 (three) times daily.   03/01/2021   Multiple Vitamin (MULTIVITAMIN) tablet Take 1 tablet by mouth daily.   02/27/2021   oxymetazoline (AFRIN) 0.05 % nasal spray Place 1 spray into both nostrils 2 (two) times daily.   Past Week   rosuvastatin (CRESTOR) 5 MG tablet Take 5 mg by mouth daily.   03/01/2021   celecoxib (CELEBREX) 200 MG capsule Take 1 capsule (200 mg total) by mouth 2 (two) times daily. (Patient not taking: Reported on 03/03/2021) 60 capsule 1 Not Taking   cetirizine (ZYRTEC) 10 MG tablet Take 10 mg by mouth daily.   03/01/2021   desoximetasone (TOPICORT) 0.25 % cream Apply 1 application topically 2 (two) times daily as needed. eczema (Patient not taking: Reported on 03/03/2021)   Not Taking   docusate sodium (COLACE) 100 MG capsule Take 100 mg by mouth daily as needed for mild constipation. (Patient not taking:  Reported on 03/03/2021)   Not Taking   enoxaparin (LOVENOX) 40 MG/0.4ML injection Inject 0.4 mLs (40 mg total) into the skin daily for 14 days. 5.6 mL 0    Flaxseed, Linseed, (FLAXSEED OIL) 1000 MG CAPS Take 1,000 mg by mouth daily.   03/01/2021   oxyCODONE (OXY IR/ROXICODONE) 5 MG immediate release tablet Take 1 tablet (5 mg total) by mouth every 4 (four) hours as needed for moderate pain (pain score 4-6). (Patient not taking: Reported on 03/03/2021) 30 tablet 0 Not Taking   predniSONE (DELTASONE) 10 MG tablet Take 10 mg by mouth daily with breakfast. (Patient not taking: Reported on 03/03/2021)   Not Taking   traMADol (ULTRAM) 50 MG tablet Take 1-2 tablets (50-100 mg total) by mouth every 4 (four) hours as needed for moderate pain. (Patient not taking: Reported on 03/03/2021) 30 tablet 1 Not Taking   triamcinolone cream (KENALOG) 0.1 % Apply 1 application topically 2 (two) times daily. (Patient not taking: Reported on 03/03/2021)   Not Taking     No Known Allergies   Past Medical History:  Diagnosis Date   Back pain     Review of systems:  Otherwise negative.    Physical Exam  Gen: Alert, oriented. Appears stated age.  HEENT: PERRLA. Lungs: No respiratory distress CV: RRR Abd: soft, benign, no masses Ext: No edema    Planned procedures: Proceed with colonoscopy. The patient understands the nature of the planned procedure, indications, risks, alternatives and  potential complications including but not limited to bleeding, infection, perforation, damage to internal organs and possible oversedation/side effects from anesthesia. The patient agrees and gives consent to proceed.  Please refer to procedure notes for findings, recommendations and patient disposition/instructions.     Lindsey Rubenstein, MD [facility]

## 2021-03-03 NOTE — Anesthesia Postprocedure Evaluation (Signed)
Anesthesia Post Note  Patient: Lindsey Freeman  Procedure(s) Performed: COLONOSCOPY WITH PROPOFOL  Patient location during evaluation: Endoscopy Anesthesia Type: General Level of consciousness: awake and alert Pain management: pain level controlled Vital Signs Assessment: post-procedure vital signs reviewed and stable Respiratory status: spontaneous breathing, nonlabored ventilation, respiratory function stable and patient connected to nasal cannula oxygen Cardiovascular status: blood pressure returned to baseline and stable Postop Assessment: no apparent nausea or vomiting Anesthetic complications: no   No notable events documented.   Last Vitals:  Vitals:   03/03/21 1325 03/03/21 1335  BP: (!) 150/72 (!) 133/53  Pulse: 75 72  Resp: (!) 22 15  Temp:    SpO2: 99% 100%    Last Pain:  Vitals:   03/03/21 1335  TempSrc:   PainSc: 0-No pain                 Margaree Mackintosh

## 2021-03-04 ENCOUNTER — Ambulatory Visit: Payer: Medicare Other | Admitting: Physical Therapy

## 2021-03-04 ENCOUNTER — Encounter: Payer: Self-pay | Admitting: Gastroenterology

## 2021-03-04 ENCOUNTER — Other Ambulatory Visit: Payer: Self-pay

## 2021-03-04 DIAGNOSIS — R293 Abnormal posture: Secondary | ICD-10-CM | POA: Diagnosis not present

## 2021-03-04 DIAGNOSIS — G8929 Other chronic pain: Secondary | ICD-10-CM

## 2021-03-04 LAB — SURGICAL PATHOLOGY

## 2021-03-04 NOTE — Therapy (Signed)
[facility] [address] [place], [place], [address] Phone: [phone]   Fax:  [phone]  Physical Therapy Treatment  Patient Details  Name: Lindsey Freeman MRN: [id number] Date of Birth: 1945/09/08 Referring Provider (PT): Meeler, Whitney   Encounter Date: 03/04/2021   PT End of Session - 03/04/21 1418     Visit Number 14    Number of Visits 16    Date for PT Re-Evaluation 03/12/21    Authorization Type PN 01/29/2021    Authorization Time Period IE 11/20/2020    PT Start Time 1415    PT Stop Time 1455    PT Time Calculation (min) 40 min    Activity Tolerance Patient tolerated treatment well    Behavior During Therapy [facility] for tasks assessed/performed             Past Medical History:  Diagnosis Date   Back pain     Past Surgical History:  Procedure Laterality Date   ABDOMINAL HYSTERECTOMY     COLONOSCOPY WITH PROPOFOL N/A 03/03/2021   Procedure: COLONOSCOPY WITH PROPOFOL;  Surgeon: Lesly Rubenstein, MD;  Location: [facility] ENDOSCOPY;  Service: Endoscopy;  Laterality: N/A;   TOTAL HIP ARTHROPLASTY Left 03/07/2019   Procedure: TOTAL HIP ARTHROPLASTY;  Surgeon: Dereck Leep, MD;  Location: [facility] ORS;  Service: Orthopedics;  Laterality: Left;    There were no vitals filed for this visit.   Subjective Assessment - 03/04/21 1415     Subjective Patient notes that she is in a bit more discomfort today as a result of being off medicine for recent colonoscopy. Patient reports that colonoscopy went well and she has a clear report. Patient does note that she has restarted medication.    Currently in Pain? Yes    Pain Score 8     Pain Location Back    Pain Orientation Lower              TREATMENT  Manual Therapy: STM and TPR performed to L posterior hip, L hamstring, and L lumbar region to allow for decreased tension and pain and improved posture and function L Sacral border mobilizations with movement for decreased  spasm and improved mobility, grade II/III L superficial dorsal sacrococcygeal ligament MFR for decreased pain and spasm L innominate posterior mobilizations for decreased pain and improved mobility L LAD in sidelying for improved posture and pain  L innominate distraction for improved posture and pain   Patient educated throughout session on appropriate technique and form using multi-modal cueing, HEP, and activity modification. Patient articulated understanding and returned demonstration.  Patient Response to interventions: Comfortable to return in 1 week; 5-6/10 pain  ASSESSMENT Patient presents to clinic with excellent motivation to participate in therapy. Patient demonstrates deficits in spinal mobility, posture, postural strength/endurance, and body mechanics. Patient reporting clinically significant change in pain with manual interventions during today's session and continues to respond positively to manual interventions and demonstrate appropriate application of HEP. Patient will benefit from continued skilled therapeutic intervention to address remaining deficits in spinal mobility, posture, postural strength/endurance, and body mechanics in order to increase function and improve overall QOL.     PT Long Term Goals - 01/15/21 1418       PT LONG TERM GOAL #1   Title Patient will be independent with HEP in order to decrease back pain and increase strength in order to improve pain-free function at home and work.    Baseline IE: not initiated; 7/28: IND  Time 8    Period Weeks    Status Achieved    Target Date 01/15/21      PT LONG TERM GOAL #2   Title Patient will demonstrate improved function as evidenced by a score of 59 on FOTO measure for full participation in activities at home and in the community.    Baseline IE: 50; 7/28: 57    Time 8    Period Weeks    Status On-going    Target Date 03/12/21      PT LONG TERM GOAL #3   Title Patient will decrease worst pain as  reported on NPRS by at least 2 points to demonstrate clinically significant reduction in pain in order to restore/improve function and overall QOL.    Baseline IE: 9/10; 7/28: AM 9/10, PM 6/10    Time 8    Period Weeks    Status On-going    Target Date 03/12/21      PT LONG TERM GOAL #4   Title Patient will be able to perform daily household chores including laundry, bed making, and walking the dog with pain controlled at or below 5/10 on NPRS scale for 24 hour period in order to improve function and QOL.    Baseline IE: 9/10 pain; 7/28: 7/10 pain    Time 8    Period Weeks    Status On-going    Target Date 03/12/21                   Plan - 03/04/21 1419     Clinical Impression Statement Patient presents to clinic with excellent motivation to participate in therapy. Patient demonstrates deficits in spinal mobility, posture, postural strength/endurance, and body mechanics. Patient reporting clinically significant change in pain with manual interventions during today's session and continues to respond positively to manual interventions and demonstrate appropriate application of HEP. Patient will benefit from continued skilled therapeutic intervention to address remaining deficits in spinal mobility, posture, postural strength/endurance, and body mechanics in order to increase function and improve overall QOL.    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 stenosis, osteopenia, h/o of THA L, colon polyp    Examination-Activity Limitations Transfers;Squat;Bend;Lift;Locomotion Level;Carry;Reach Overhead;Stand    Examination-Participation Restrictions Laundry;Yard Work;Meal Prep;Shop;Cleaning    Stability/Clinical Decision Making Evolving/Moderate complexity    Rehab Potential Fair    PT Frequency 1x / week    PT Duration 8 weeks    PT  Treatment/Interventions ADLs/Self Care Home Management;Aquatic Therapy;Cryotherapy;Electrical Stimulation;Moist Heat;Therapeutic exercise;Neuromuscular re-education;Patient/family education;Manual techniques;Taping;Energy conservation;Scar mobilization;Orthotic Fit/Training    PT Next Visit Plan abdominal strengthening progression, manual, scar massage    PT Home Exercise Plan gentle lumbar stretches, mermaid stretch    Consulted and Agree with Plan of Care Patient             Patient will benefit from skilled therapeutic intervention in order to improve the following deficits and impairments:  Abnormal gait, Decreased endurance, Decreased mobility, Difficulty walking, Improper body mechanics, Decreased range of motion, Decreased activity tolerance, Decreased strength, Postural dysfunction, Pain  Visit Diagnosis: Abnormal posture  Chronic low back pain, unspecified back pain laterality, unspecified whether sciatica present     Problem List Patient Active Problem List   Diagnosis Date Noted   Eczema 03/07/2019   H/O total hip arthroplasty 03/07/2019   Primary osteoarthritis of left hip 07/12/2017   Osteopenia of neck of femur 03/24/2017   Hyperlipidemia, mixed  03/17/2017   Low back pain with sciatica 04/08/2015   Colon polyp 09/10/2013    Myles Gip PT, DPT [phone]  03/04/2021, 5:12 PM  [facility] [address] [place], [place], [address] Phone: [phone]   Fax:  [phone]  Name: Lindsey Freeman MRN: [id number] Date of Birth: [birth year]

## 2021-03-11 ENCOUNTER — Other Ambulatory Visit: Payer: Self-pay

## 2021-03-11 ENCOUNTER — Ambulatory Visit: Payer: Medicare Other | Admitting: Physical Therapy

## 2021-03-11 ENCOUNTER — Encounter: Payer: Self-pay | Admitting: Physical Therapy

## 2021-03-11 DIAGNOSIS — G8929 Other chronic pain: Secondary | ICD-10-CM

## 2021-03-11 DIAGNOSIS — R293 Abnormal posture: Secondary | ICD-10-CM

## 2021-03-11 NOTE — Therapy (Signed)
[facility] [address] [place], [place], [address] Phone: [phone]   Fax:  [phone]  Physical Therapy Treatment  Patient Details  Name: Lindsey Freeman MRN: [id number] Date of Birth: May 18, 1946 Referring Provider (PT): Meeler, Whitney   Encounter Date: 03/11/2021   PT End of Session - 03/11/21 1506     Visit Number 15    Number of Visits 16    Date for PT Re-Evaluation 03/12/21    Authorization Type PN 01/29/2021    Authorization Time Period IE 11/20/2020    PT Start Time 1500    PT Stop Time 1540    PT Time Calculation (min) 40 min    Activity Tolerance Patient tolerated treatment well    Behavior During Therapy [facility] for tasks assessed/performed             Past Medical History:  Diagnosis Date   Back pain     Past Surgical History:  Procedure Laterality Date   ABDOMINAL HYSTERECTOMY     COLONOSCOPY WITH PROPOFOL N/A 03/03/2021   Procedure: COLONOSCOPY WITH PROPOFOL;  Surgeon: Lesly Rubenstein, MD;  Location: [facility] ENDOSCOPY;  Service: Endoscopy;  Laterality: N/A;   TOTAL HIP ARTHROPLASTY Left 03/07/2019   Procedure: TOTAL HIP ARTHROPLASTY;  Surgeon: Dereck Leep, MD;  Location: [facility] ORS;  Service: Orthopedics;  Laterality: Left;    There were no vitals filed for this visit.   Subjective Assessment - 03/11/21 1502     Subjective Patient reports that she had a positive visit with orthopedist and he is happy with how her L hip has healed over the past 2 years. She notes she has spent a decent amount of time sitting today to take care of things for her mother and as a result feels a bit stiff on the L side SIJ. She reports she has continued to benefit from stretches and standing postural work.    Currently in Pain? Yes    Pain Score 7     Pain Location Back    Pain Orientation Lower              TREATMENT  Manual Therapy: STM and TPR performed to L posterior hip and L lumbar region to allow for  decreased tension and pain and improved posture and function L Sacral border mobilizations with movement for decreased spasm and improved mobility, grade II/III L superficial dorsal sacrococcygeal ligament MFR for decreased pain and spasm L innominate posterior mobilizations for decreased pain and improved mobility L LAD in sidelying for improved posture and pain  L innominate distraction for improved posture and pain   Patient educated throughout session on appropriate technique and form using multi-modal cueing, HEP, and activity modification. Patient articulated understanding and returned demonstration.  Patient Response to interventions: Comfortable to return in 1 week  ASSESSMENT Patient presents to clinic with excellent motivation to participate in therapy. Patient demonstrates deficits in spinal mobility, posture, postural strength/endurance, and body mechanics. Patient continues to have positive response to manual interventions during today's session and manages independently between sessions with prescribed HEP. Patient will benefit from continued skilled therapeutic intervention to address remaining deficits in spinal mobility, posture, postural strength/endurance, and body mechanics in order to increase function and improve overall QOL.    PT Long Term Goals - 01/15/21 1418       PT LONG TERM GOAL #1   Title Patient will be independent with HEP in order to decrease back pain and increase strength  in order to improve pain-free function at home and work.    Baseline IE: not initiated; 7/28: IND    Time 8    Period Weeks    Status Achieved    Target Date 01/15/21      PT LONG TERM GOAL #2   Title Patient will demonstrate improved function as evidenced by a score of 59 on FOTO measure for full participation in activities at home and in the community.    Baseline IE: 50; 7/28: 57    Time 8    Period Weeks    Status On-going    Target Date 03/12/21      PT LONG TERM GOAL #3    Title Patient will decrease worst pain as reported on NPRS by at least 2 points to demonstrate clinically significant reduction in pain in order to restore/improve function and overall QOL.    Baseline IE: 9/10; 7/28: AM 9/10, PM 6/10    Time 8    Period Weeks    Status On-going    Target Date 03/12/21      PT LONG TERM GOAL #4   Title Patient will be able to perform daily household chores including laundry, bed making, and walking the dog with pain controlled at or below 5/10 on NPRS scale for 24 hour period in order to improve function and QOL.    Baseline IE: 9/10 pain; 7/28: 7/10 pain    Time 8    Period Weeks    Status On-going    Target Date 03/12/21                   Plan - 03/11/21 1507     Clinical Impression Statement Patient presents to clinic with excellent motivation to participate in therapy. Patient demonstrates deficits in spinal mobility, posture, postural strength/endurance, and body mechanics. Patient continues to have positive response to manual interventions during today's session and manages independently between sessions with prescribed HEP. Patient will benefit from continued skilled therapeutic intervention to address remaining deficits in spinal mobility, posture, postural strength/endurance, and body mechanics in order to increase function and improve overall QOL.    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 stenosis, osteopenia, h/o of THA L, colon polyp    Examination-Activity Limitations Transfers;Squat;Bend;Lift;Locomotion Level;Carry;Reach Overhead;Stand    Examination-Participation Restrictions Laundry;Yard Work;Meal Prep;Shop;Cleaning    Stability/Clinical Decision Making Evolving/Moderate complexity    Rehab Potential Fair    PT Frequency 1x / week    PT Duration 8 weeks    PT  Treatment/Interventions ADLs/Self Care Home Management;Aquatic Therapy;Cryotherapy;Electrical Stimulation;Moist Heat;Therapeutic exercise;Neuromuscular re-education;Patient/family education;Manual techniques;Taping;Energy conservation;Scar mobilization;Orthotic Fit/Training    PT Next Visit Plan abdominal strengthening progression, manual, scar massage    PT Home Exercise Plan gentle lumbar stretches, mermaid stretch    Consulted and Agree with Plan of Care Patient             Patient will benefit from skilled therapeutic intervention in order to improve the following deficits and impairments:  Abnormal gait, Decreased endurance, Decreased mobility, Difficulty walking, Improper body mechanics, Decreased range of motion, Decreased activity tolerance, Decreased strength, Postural dysfunction, Pain  Visit Diagnosis: Abnormal posture  Chronic low back pain, unspecified back pain laterality, unspecified whether sciatica present     Problem List Patient Active Problem List   Diagnosis Date Noted   Eczema 03/07/2019   H/O total hip arthroplasty 03/07/2019   Primary osteoarthritis of  left hip 07/12/2017   Osteopenia of neck of femur 03/24/2017   Hyperlipidemia, mixed 03/17/2017   Low back pain with sciatica 04/08/2015   Colon polyp 09/10/2013    Myles Gip PT, DPT [id number]  03/12/2021, 10:45 AM  [facility] [address] [place], [place], [address] Phone: [phone]   Fax:  [phone]  Name: Lindsey Freeman MRN: [id number] Date of Birth: [birth year]

## 2021-03-18 ENCOUNTER — Other Ambulatory Visit: Payer: Self-pay

## 2021-03-18 ENCOUNTER — Ambulatory Visit: Payer: Medicare Other | Admitting: Physical Therapy

## 2021-03-18 ENCOUNTER — Encounter: Payer: Self-pay | Admitting: Physical Therapy

## 2021-03-18 DIAGNOSIS — R293 Abnormal posture: Secondary | ICD-10-CM

## 2021-03-18 DIAGNOSIS — G8929 Other chronic pain: Secondary | ICD-10-CM

## 2021-03-18 NOTE — Therapy (Signed)
[facility] [address] [place], [place], [address] Phone: [phone]   Fax:  [phone]  Physical Therapy Treatment  Patient Details  Name: Lindsey Freeman MRN: [id number] Date of Birth: 21-Jul-1945 Referring Provider (PT): Meeler, Whitney   Encounter Date: 03/18/2021   PT End of Session - 03/18/21 1416     Visit Number 16    Number of Visits 24    Date for PT Re-Evaluation 05/13/21    Authorization Type PN 01/29/2021    Authorization Time Period IE 11/20/2020    PT Start Time 1415    PT Stop Time 1455    PT Time Calculation (min) 40 min    Activity Tolerance Patient tolerated treatment well    Behavior During Therapy [facility] for tasks assessed/performed             Past Medical History:  Diagnosis Date   Back pain     Past Surgical History:  Procedure Laterality Date   ABDOMINAL HYSTERECTOMY     COLONOSCOPY WITH PROPOFOL N/A 03/03/2021   Procedure: COLONOSCOPY WITH PROPOFOL;  Surgeon: Lesly Rubenstein, MD;  Location: [facility] ENDOSCOPY;  Service: Endoscopy;  Laterality: N/A;   TOTAL HIP ARTHROPLASTY Left 03/07/2019   Procedure: TOTAL HIP ARTHROPLASTY;  Surgeon: Dereck Leep, MD;  Location: [facility] ORS;  Service: Orthopedics;  Laterality: Left;    There were no vitals filed for this visit.   Subjective Assessment - 03/18/21 1519     Subjective Patient states that she had a busy day yesterday with changing linens, laundry, groecery shoppping, and meal prep for her mother and while her pain existed it did not limit her participation. Patient also had first session of accupuncture which she found at the very least to be relaxing; she was informed that after ~ 4 sessions they will have a better idea if she is a responder to accupuncture for her low back pain.    Currently in Pain? Yes    Pain Score 6     Pain Location Back    Pain Orientation Lower            TREATMENT  Manual Therapy: STM and TPR performed to L  posterior hip, L hamstring, and L lumbar region to allow for decreased tension and pain and improved posture and function L Sacral border mobilizations with movement for decreased spasm and improved mobility, grade II/III L superficial dorsal sacrococcygeal ligament MFR for decreased pain and spasm L innominate posterior mobilizations for decreased pain and improved mobility  Neuromuscular Re-education: Sidelying diaphragmatic breathing for nervous system downregulation Reassessed goals; see below.   Patient educated throughout session on appropriate technique and form using multi-modal cueing, HEP, and activity modification. Patient articulated understanding and returned demonstration.  Patient Response to interventions: Comfortable to return in 2 weeks  ASSESSMENT Patient presents to clinic with excellent motivation to participate in therapy. Patient continues to demonstrate deficits in spinal mobility, posture, postural strength/endurance, and body mechanics. Patient indicating improving function in relationship with physical therapy as evidenced by her ability to participate in more ADLs in a given 24 hour period. Additionally, has had a clinically significant reduction in worst pain measured on NPRS from 9/10 at initial evaluation to 7/10 today. Patient with continued good response to manual interventions during today's session and is amenable to hip strengthening at next session in the context of coninued well-controlled pain. Patient's condition has the potential to improve in response to therapy. Maximum improvement is yet  to be obtained. The anticipated improvement is attainable and reasonable in a generally predictable time. Patient will benefit from continued skilled therapeutic intervention to address remaining deficits in spinal mobility, posture, postural strength/endurance, and body mechanics in order to increase function and improve overall QOL.    PT Long Term Goals - 03/18/21 1418        PT LONG TERM GOAL #1   Title Patient will be independent with HEP in order to decrease back pain and increase strength in order to improve pain-free function at home and work.    Baseline IE: not initiated; 7/28: IND    Time 8    Period Weeks    Status Achieved      PT LONG TERM GOAL #2   Title Patient will demonstrate improved function as evidenced by a score of 59 on FOTO measure for full participation in activities at home and in the community.    Baseline IE: 50; 7/28: 57; 9/28: 56    Time 8    Period Weeks    Status On-going    Target Date 05/13/21      PT LONG TERM GOAL #3   Title Patient will decrease worst pain as reported on NPRS by at least 2 points to demonstrate clinically significant reduction in pain in order to restore/improve function and overall QOL.    Baseline IE: 9/10; 7/28: AM 9/10, PM 6/10; 9/28: AM 7/10, PM 5/10    Time 8    Period Weeks    Status On-going    Target Date 05/13/21      PT LONG TERM GOAL #4   Title Patient will be able to perform ADLs and community activities including laundry, bed making, grocery shopping, meal prep and walking the dog with pain controlled at or below 6/10 on NPRS for 24 hour period in order to improve function and QOL.    Baseline IE: 9/10 pain; 7/28: 7/10 pain; 9/28: 6-7/10 pain (changed linens, meal prep for mother, grocery, laundry)    Time 8    Period Weeks    Status Revised    Target Date 05/13/21                   Plan - 03/18/21 1417     Clinical Impression Statement Patient presents to clinic with excellent motivation to participate in therapy. Patient continues to demonstrate deficits in spinal mobility, posture, postural strength/endurance, and body mechanics. Patient indicating improving function in relationship with physical therapy as evidenced by her ability to participate in more ADLs in a given 24 hour period. Additionally, has had a clinically significant reduction in worst pain measured on  NPRS from 9/10 at initial evaluation to 7/10 today. Patient with continued good response to manual interventions during today's session and is amenable to hip strengthening at next session in the context of coninued well-controlled pain. Patient's condition has the potential to improve in response to therapy. Maximum improvement is yet to be obtained. The anticipated improvement is attainable and reasonable in a generally predictable time. Patient will benefit from continued skilled therapeutic intervention to address remaining deficits in spinal mobility, posture, postural strength/endurance, and body mechanics in order to increase function and improve overall QOL.    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 stenosis, osteopenia, h/o of THA L, colon polyp    Examination-Activity Limitations Transfers;Squat;Bend;Lift;Locomotion Level;Carry;Reach Overhead;Stand    Examination-Participation Restrictions Laundry;Yard Work;Meal Prep;Shop;Cleaning  Stability/Clinical Decision Making Evolving/Moderate complexity    Rehab Potential Fair    PT Frequency 1x / week    PT Duration 8 weeks    PT Treatment/Interventions ADLs/Self Care Home Management;Aquatic Therapy;Cryotherapy;Electrical Stimulation;Moist Heat;Therapeutic exercise;Neuromuscular re-education;Patient/family education;Manual techniques;Taping;Energy conservation;Scar mobilization;Orthotic Fit/Training    PT Next Visit Plan abdominal strengthening progression, manual, scar massage    PT Home Exercise Plan gentle lumbar stretches, mermaid stretch    Consulted and Agree with Plan of Care Patient             Patient will benefit from skilled therapeutic intervention in order to improve the following deficits and impairments:  Abnormal gait, Decreased endurance, Decreased mobility, Difficulty  walking, Improper body mechanics, Decreased range of motion, Decreased activity tolerance, Decreased strength, Postural dysfunction, Pain  Visit Diagnosis: Abnormal posture  Chronic low back pain, unspecified back pain laterality, unspecified whether sciatica present     Problem List Patient Active Problem List   Diagnosis Date Noted   Eczema 03/07/2019   H/O total hip arthroplasty 03/07/2019   Primary osteoarthritis of left hip 07/12/2017   Osteopenia of neck of femur 03/24/2017   Hyperlipidemia, mixed 03/17/2017   Low back pain with sciatica 04/08/2015   Colon polyp 09/10/2013    Myles Gip PT, DPT [phone]  03/18/2021, 3:46 PM  [facility] [address] [place], [place], [address] Phone: [phone]   Fax:  [phone]  Name: Lindsey Freeman MRN: [id number] Date of Birth: 1946/03/24

## 2021-04-01 ENCOUNTER — Encounter: Payer: Self-pay | Admitting: Physical Therapy

## 2021-04-01 ENCOUNTER — Other Ambulatory Visit: Payer: Self-pay

## 2021-04-01 ENCOUNTER — Ambulatory Visit: Payer: Medicare Other | Attending: Family Medicine | Admitting: Physical Therapy

## 2021-04-01 DIAGNOSIS — M545 Low back pain, unspecified: Secondary | ICD-10-CM | POA: Insufficient documentation

## 2021-04-01 DIAGNOSIS — R293 Abnormal posture: Secondary | ICD-10-CM | POA: Insufficient documentation

## 2021-04-01 DIAGNOSIS — G8929 Other chronic pain: Secondary | ICD-10-CM | POA: Diagnosis present

## 2021-04-01 NOTE — Therapy (Signed)
[facility] [address] [place], [place], [address] Phone: [phone]   Fax:  [phone]  Physical Therapy Treatment  Patient Details  Name: Lindsey Freeman MRN: [id number] Date of Birth: April 27, 1946 Referring Provider (PT): Meeler, Whitney   Encounter Date: 04/01/2021   PT End of Session - 04/01/21 1412     Visit Number 17    Number of Visits 24    Date for PT Re-Evaluation 05/13/21    Authorization Type PN 01/29/2021    Authorization Time Period IE 11/20/2020    PT Start Time 1415    PT Stop Time 1455    PT Time Calculation (min) 40 min    Activity Tolerance Patient tolerated treatment well    Behavior During Therapy [facility] for tasks assessed/performed             Past Medical History:  Diagnosis Date   Back pain     Past Surgical History:  Procedure Laterality Date   ABDOMINAL HYSTERECTOMY     COLONOSCOPY WITH PROPOFOL N/A 03/03/2021   Procedure: COLONOSCOPY WITH PROPOFOL;  Surgeon: Lesly Rubenstein, MD;  Location: [facility] ENDOSCOPY;  Service: Endoscopy;  Laterality: N/A;   TOTAL HIP ARTHROPLASTY Left 03/07/2019   Procedure: TOTAL HIP ARTHROPLASTY;  Surgeon: Dereck Leep, MD;  Location: [facility] ORS;  Service: Orthopedics;  Laterality: Left;    There were no vitals filed for this visit.   Subjective Assessment - 04/01/21 1415     Subjective Patient notes that she has had her second accupuncture session and goes for her third tomorrow. She has not noticed a significant change yet but remains hopeful. patient notes she feels pretty good (6/10) but has noticed with more humid weather she feels worse. Patient was able to walk around at museums in [place] with stretching breaks and pain controlled during recent trip including a mile walk prior to [place].    Currently in Pain? Yes    Pain Score 6     Pain Location Back    Pain Orientation Lower              TREATMENT  Manual Therapy: STM and TPR performed to L posterior  hip, L hamstring, and L lumbar region to allow for decreased tension and pain and improved posture and function L Sacral border mobilizations with movement for decreased spasm and improved mobility, grade II/III L superficial dorsal sacrococcygeal ligament MFR for decreased pain and spasm L innominate posterior mobilizations for decreased pain and improved mobility Sidelying L LAD for improved posture and pain  Patient educated throughout session on appropriate technique and form using multi-modal cueing, HEP, and activity modification. Patient articulated understanding and returned demonstration.  Patient Response to interventions: Comfortable to return in 2 weeks  ASSESSMENT Patient presents to clinic with excellent motivation to participate in therapy. Patient continues to demonstrate deficits in spinal mobility, posture, postural strength/endurance, and body mechanics. Patient experiencing greatest stretch sensation in sidelying LAD of L hip during today's session and is eager to trial new hip strengthening (as emailed via [place], access code: [place]. Patient will benefit from continued skilled therapeutic intervention to address remaining deficits in spinal mobility, posture, postural strength/endurance, and body mechanics in order to increase function and improve overall QOL.    PT Long Term Goals - 03/18/21 1418       PT LONG TERM GOAL #1   Title Patient will be independent with HEP in order to decrease back pain and increase strength  in order to improve pain-free function at home and work.    Baseline IE: not initiated; 7/28: IND    Time 8    Period Weeks    Status Achieved      PT LONG TERM GOAL #2   Title Patient will demonstrate improved function as evidenced by a score of 59 on FOTO measure for full participation in activities at home and in the community.    Baseline IE: 50; 7/28: 57; 9/28: 56    Time 8    Period Weeks    Status On-going    Target Date 05/13/21       PT LONG TERM GOAL #3   Title Patient will decrease worst pain as reported on NPRS by at least 2 points to demonstrate clinically significant reduction in pain in order to restore/improve function and overall QOL.    Baseline IE: 9/10; 7/28: AM 9/10, PM 6/10; 9/28: AM 7/10, PM 5/10    Time 8    Period Weeks    Status On-going    Target Date 05/13/21      PT LONG TERM GOAL #4   Title Patient will be able to perform ADLs and community activities including laundry, bed making, grocery shopping, meal prep and walking the dog with pain controlled at or below 6/10 on NPRS for 24 hour period in order to improve function and QOL.    Baseline IE: 9/10 pain; 7/28: 7/10 pain; 9/28: 6-7/10 pain (changed linens, meal prep for mother, grocery, laundry)    Time 8    Period Weeks    Status Revised    Target Date 05/13/21                   Plan - 04/01/21 1412     Clinical Impression Statement Patient presents to clinic with excellent motivation to participate in therapy. Patient continues to demonstrate deficits in spinal mobility, posture, postural strength/endurance, and body mechanics. Patient experiencing greatest stretch sensation in sidelying LAD of L hip during today's session and is eager to trial new hip strengthening (as emailed via [place], access code: [place]. Patient will benefit from continued skilled therapeutic intervention to address remaining deficits in spinal mobility, posture, postural strength/endurance, and body mechanics in order to increase function and improve overall QOL.    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 stenosis, osteopenia, h/o of THA L, colon polyp    Examination-Activity Limitations Transfers;Squat;Bend;Lift;Locomotion Level;Carry;Reach Overhead;Stand    Examination-Participation Restrictions Laundry;Yard  Work;Meal Prep;Shop;Cleaning    Stability/Clinical Decision Making Evolving/Moderate complexity    Rehab Potential Fair    PT Frequency 1x / week    PT Duration 8 weeks    PT Treatment/Interventions ADLs/Self Care Home Management;Aquatic Therapy;Cryotherapy;Electrical Stimulation;Moist Heat;Therapeutic exercise;Neuromuscular re-education;Patient/family education;Manual techniques;Taping;Energy conservation;Scar mobilization;Orthotic Fit/Training    PT Next Visit Plan abdominal strengthening progression, manual, scar massage    PT Home Exercise Plan gentle lumbar stretches, mermaid stretch    Consulted and Agree with Plan of Care Patient             Patient will benefit from skilled therapeutic intervention in order to improve the following deficits and impairments:  Abnormal gait, Decreased endurance, Decreased mobility, Difficulty walking, Improper body mechanics, Decreased range of motion, Decreased activity tolerance, Decreased strength, Postural dysfunction, Pain  Visit Diagnosis: Abnormal posture  Chronic low back pain, unspecified back pain laterality, unspecified whether sciatica present  Problem List Patient Active Problem List   Diagnosis Date Noted   Eczema 03/07/2019   H/O total hip arthroplasty 03/07/2019   Primary osteoarthritis of left hip 07/12/2017   Osteopenia of neck of femur 03/24/2017   Hyperlipidemia, mixed 03/17/2017   Low back pain with sciatica 04/08/2015   Colon polyp 09/10/2013    Myles Gip PT, DPT [phone]  04/01/2021, 4:23 PM  [facility] [address] [place], [place], [address] Phone: [phone]   Fax:  [phone]  Name: Lindsey Freeman MRN: [id number] Date of Birth: 06-29-1945

## 2021-04-14 ENCOUNTER — Encounter: Payer: Self-pay | Admitting: Physical Therapy

## 2021-04-14 ENCOUNTER — Other Ambulatory Visit: Payer: Self-pay

## 2021-04-14 ENCOUNTER — Ambulatory Visit: Payer: Medicare Other | Admitting: Physical Therapy

## 2021-04-14 DIAGNOSIS — R293 Abnormal posture: Secondary | ICD-10-CM | POA: Diagnosis not present

## 2021-04-14 DIAGNOSIS — G8929 Other chronic pain: Secondary | ICD-10-CM

## 2021-04-14 NOTE — Therapy (Signed)
[facility] [address] [place], [place], [address] Phone: [phone]   Fax:  [phone]  Physical Therapy Treatment  Patient Details  Name: JOZLYN SCHATZ MRN: [id number] Date of Birth: Jun 02, 1946 Referring Provider (PT): Meeler, Whitney   Encounter Date: 04/14/2021   PT End of Session - 04/14/21 1126     Visit Number 18    Number of Visits 24    Date for PT Re-Evaluation 05/13/21    Authorization Type PN 01/29/2021    Authorization Time Period IE 11/20/2020    PT Start Time 1115    PT Stop Time 1155    PT Time Calculation (min) 40 min    Activity Tolerance Patient tolerated treatment well    Behavior During Therapy [facility] for tasks assessed/performed             Past Medical History:  Diagnosis Date   Back pain     Past Surgical History:  Procedure Laterality Date   ABDOMINAL HYSTERECTOMY     COLONOSCOPY WITH PROPOFOL N/A 03/03/2021   Procedure: COLONOSCOPY WITH PROPOFOL;  Surgeon: Lesly Rubenstein, MD;  Location: [facility] ENDOSCOPY;  Service: Endoscopy;  Laterality: N/A;   TOTAL HIP ARTHROPLASTY Left 03/07/2019   Procedure: TOTAL HIP ARTHROPLASTY;  Surgeon: Dereck Leep, MD;  Location: [facility] ORS;  Service: Orthopedics;  Laterality: Left;    There were no vitals filed for this visit.   Subjective Assessment - 04/14/21 1117     Subjective Patient reports that she had positive response after third accupuncture session. Patient notes that while she hasn't felt profound improvement, she does notice improvement. Patient has also been dealing increasing stress with caregiving for her mother.    Currently in Pain? Yes            TREATMENT  Manual Therapy: STM and TPR performed to L posterior hip, L hamstring, and L lumbar region to allow for decreased tension and pain and improved posture and function L Sacral border mobilizations with movement for decreased spasm and improved mobility, grade II/III L superficial  dorsal sacrococcygeal ligament MFR for decreased pain and spasm L innominate posterior mobilizations for decreased pain and improved mobility  Neuromuscular Re-education: Sidelying L clamshell for improved pain and pelvic posture Sidelying L reverse clamshell for improved pain and pelvic posture   Patient educated throughout session on appropriate technique and form using multi-modal cueing, HEP, and activity modification. Patient articulated understanding and returned demonstration.  Patient Response to interventions: Notes some stiffness in L flank  ASSESSMENT Patient presents to clinic with excellent motivation to participate in therapy. Patient continues to demonstrate deficits in spinal mobility, posture, postural strength/endurance, and body mechanics. Patient had decreased tenderness to palpation after manual at L sacral border during today's session and continues to notice correlation between emotional stress and physical pain/stiffness. Patient will benefit from continued skilled therapeutic intervention to address remaining deficits in spinal mobility, posture, postural strength/endurance, and body mechanics in order to increase function and improve overall QOL.    PT Long Term Goals - 03/18/21 1418       PT LONG TERM GOAL #1   Title Patient will be independent with HEP in order to decrease back pain and increase strength in order to improve pain-free function at home and work.    Baseline IE: not initiated; 7/28: IND    Time 8    Period Weeks    Status Achieved      PT LONG TERM GOAL #  2   Title Patient will demonstrate improved function as evidenced by a score of 59 on FOTO measure for full participation in activities at home and in the community.    Baseline IE: 50; 7/28: 57; 9/28: 56    Time 8    Period Weeks    Status On-going    Target Date 05/13/21      PT LONG TERM GOAL #3   Title Patient will decrease worst pain as reported on NPRS by at least 2 points to  demonstrate clinically significant reduction in pain in order to restore/improve function and overall QOL.    Baseline IE: 9/10; 7/28: AM 9/10, PM 6/10; 9/28: AM 7/10, PM 5/10    Time 8    Period Weeks    Status On-going    Target Date 05/13/21      PT LONG TERM GOAL #4   Title Patient will be able to perform ADLs and community activities including laundry, bed making, grocery shopping, meal prep and walking the dog with pain controlled at or below 6/10 on NPRS for 24 hour period in order to improve function and QOL.    Baseline IE: 9/10 pain; 7/28: 7/10 pain; 9/28: 6-7/10 pain (changed linens, meal prep for mother, grocery, laundry)    Time 8    Period Weeks    Status Revised    Target Date 05/13/21                   Plan - 04/14/21 1126     Clinical Impression Statement Patient presents to clinic with excellent motivation to participate in therapy. Patient continues to demonstrate deficits in spinal mobility, posture, postural strength/endurance, and body mechanics. Patient had decreased tenderness to palpation after manual at L sacral border during today's session and continues to notice correlation between emotional stress and physical pain/stiffness. Patient will benefit from continued skilled therapeutic intervention to address remaining deficits in spinal mobility, posture, postural strength/endurance, and body mechanics in order to increase function and improve overall QOL.    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 stenosis, osteopenia, h/o of THA L, colon polyp    Examination-Activity Limitations Transfers;Squat;Bend;Lift;Locomotion Level;Carry;Reach Overhead;Stand    Examination-Participation Restrictions Laundry;Yard Work;Meal Prep;Shop;Cleaning    Stability/Clinical Decision Making Evolving/Moderate complexity    Rehab  Potential Fair    PT Frequency 1x / week    PT Duration 8 weeks    PT Treatment/Interventions ADLs/Self Care Home Management;Aquatic Therapy;Cryotherapy;Electrical Stimulation;Moist Heat;Therapeutic exercise;Neuromuscular re-education;Patient/family education;Manual techniques;Taping;Energy conservation;Scar mobilization;Orthotic Fit/Training    PT Next Visit Plan abdominal strengthening progression, manual, scar massage    PT Home Exercise Plan gentle lumbar stretches, mermaid stretch    Consulted and Agree with Plan of Care Patient             Patient will benefit from skilled therapeutic intervention in order to improve the following deficits and impairments:  Abnormal gait, Decreased endurance, Decreased mobility, Difficulty walking, Improper body mechanics, Decreased range of motion, Decreased activity tolerance, Decreased strength, Postural dysfunction, Pain  Visit Diagnosis: Abnormal posture  Chronic low back pain, unspecified back pain laterality, unspecified whether sciatica present     Problem List Patient Active Problem List   Diagnosis Date Noted   Eczema 03/07/2019   H/O total hip arthroplasty 03/07/2019   Primary osteoarthritis of left hip 07/12/2017   Osteopenia of neck of femur 03/24/2017   Hyperlipidemia, mixed 03/17/2017   Low  back pain with sciatica 04/08/2015   Colon polyp 09/10/2013    Myles Gip PT, DPT [phone]  04/14/2021, 12:43 PM  [facility] [address] [place], [place], [address] Phone: [phone]   Fax:  [phone]  Name: ADELIN VENTRELLA MRN: [id number] Date of Birth: 20-Apr-1946

## 2021-04-15 ENCOUNTER — Encounter: Payer: Self-pay | Admitting: Physical Therapy

## 2021-04-29 ENCOUNTER — Encounter: Payer: Self-pay | Admitting: Physical Therapy

## 2021-04-29 ENCOUNTER — Other Ambulatory Visit: Payer: Self-pay

## 2021-04-29 ENCOUNTER — Ambulatory Visit: Payer: Medicare Other | Attending: Family Medicine | Admitting: Physical Therapy

## 2021-04-29 DIAGNOSIS — R293 Abnormal posture: Secondary | ICD-10-CM | POA: Insufficient documentation

## 2021-04-29 DIAGNOSIS — M545 Low back pain, unspecified: Secondary | ICD-10-CM | POA: Insufficient documentation

## 2021-04-29 DIAGNOSIS — G8929 Other chronic pain: Secondary | ICD-10-CM | POA: Diagnosis present

## 2021-04-29 NOTE — Therapy (Signed)
[facility] [address] [place], [place], [address] Phone: [phone]   Fax:  [phone]  Physical Therapy Treatment  Patient Details  Name: Lindsey Freeman MRN: [id number] Date of Birth: 1946/01/17 Referring Provider (PT): Meeler, Whitney   Encounter Date: 04/29/2021   PT End of Session - 04/29/21 1430     Visit Number 19    Number of Visits 24    Date for PT Re-Evaluation 05/13/21    Authorization Type PN 01/29/2021    Authorization Time Period IE 11/20/2020    PT Start Time 1415    PT Stop Time 1455    PT Time Calculation (min) 40 min    Activity Tolerance Patient tolerated treatment well    Behavior During Therapy [facility] for tasks assessed/performed             Past Medical History:  Diagnosis Date   Back pain     Past Surgical History:  Procedure Laterality Date   ABDOMINAL HYSTERECTOMY     COLONOSCOPY WITH PROPOFOL N/A 03/03/2021   Procedure: COLONOSCOPY WITH PROPOFOL;  Surgeon: Lesly Rubenstein, MD;  Location: [facility] END[facility];  Service: Endoscopy;  Laterality: N/A;   TOTAL HIP ARTHROPLASTY Left 03/07/2019   Procedure: TOTAL HIP ARTHROPLASTY;  Surgeon: Dereck Leep, MD;  Location: [facility] ORS;  Service: Orthopedics;  Laterality: Left;    There were no vitals filed for this visit.   Subjective Assessment - 04/29/21 1429     Subjective Patient notes that she has continued to have increased stress with caregiving for mother. Patient notes that she purchased a new mattress and has noticed a difference in AM pain/stiffness.    Currently in Pain? Yes    Pain Score 6             TREATMENT  Manual Therapy: STM and TPR performed to L posterior hip, L hamstring, and L lumbar region to allow for decreased tension and pain and improved posture and function L Sacral border mobilizations with movement for decreased spasm and improved mobility, grade II/III L superficial dorsal sacrococcygeal ligament MFR for decreased  pain and spasm L innominate posterior mobilizations for decreased pain and improved mobility  Neuromuscular Re-education: Patient education on body mechanics for bending/stooping requirements in dog training to limit LBP and decrease lumbar flexion.   Patient educated throughout session on appropriate technique and form using multi-modal cueing, HEP, and activity modification. Patient articulated understanding and returned demonstration.  Patient Response to interventions: Notes some discomfort in R hip/SIJ.  ASSESSMENT Patient presents to clinic with excellent motivation to participate in therapy. Patient continues to demonstrate deficits in spinal mobility, posture, postural strength/endurance, and body mechanics. Patient continuing to respond positively to manual interventions during today's session and interested in practicing split stance squat in dog training. Patient will benefit from continued skilled therapeutic intervention to address remaining deficits in spinal mobility, posture, postural strength/endurance, and body mechanics in order to increase function and improve overall QOL.    PT Long Term Goals - 03/18/21 1418       PT LONG TERM GOAL #1   Title Patient will be independent with HEP in order to decrease back pain and increase strength in order to improve pain-free function at home and work.    Baseline IE: not initiated; 7/28: IND    Time 8    Period Weeks    Status Achieved      PT LONG TERM GOAL #2   Title Patient  will demonstrate improved function as evidenced by a score of 59 on FOTO measure for full participation in activities at home and in the community.    Baseline IE: 50; 7/28: 57; 9/28: 56    Time 8    Period Weeks    Status On-going    Target Date 05/13/21      PT LONG TERM GOAL #3   Title Patient will decrease worst pain as reported on NPRS by at least 2 points to demonstrate clinically significant reduction in pain in order to restore/improve function  and overall QOL.    Baseline IE: 9/10; 7/28: AM 9/10, PM 6/10; 9/28: AM 7/10, PM 5/10    Time 8    Period Weeks    Status On-going    Target Date 05/13/21      PT LONG TERM GOAL #4   Title Patient will be able to perform ADLs and community activities including laundry, bed making, grocery shopping, meal prep and walking the dog with pain controlled at or below 6/10 on NPRS for 24 hour period in order to improve function and QOL.    Baseline IE: 9/10 pain; 7/28: 7/10 pain; 9/28: 6-7/10 pain (changed linens, meal prep for mother, grocery, laundry)    Time 8    Period Weeks    Status Revised    Target Date 05/13/21                   Plan - 04/29/21 1526     Clinical Impression Statement Patient presents to clinic with excellent motivation to participate in therapy. Patient continues to demonstrate deficits in spinal mobility, posture, postural strength/endurance, and body mechanics. Patient continuing to respond positively to manual interventions during today's session and interested in practicing split stance squat in dog training. Patient will benefit from continued skilled therapeutic intervention to address remaining deficits in spinal mobility, posture, postural strength/endurance, and body mechanics in order to increase function and improve overall QOL.    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 stenosis, osteopenia, h/o of THA L, colon polyp    Examination-Activity Limitations Transfers;Squat;Bend;Lift;Locomotion Level;Carry;Reach Overhead;Stand    Examination-Participation Restrictions Laundry;Yard Work;Meal Prep;Shop;Cleaning    Stability/Clinical Decision Making Evolving/Moderate complexity    Rehab Potential Fair    PT Frequency 1x / week    PT Duration 8 weeks    PT Treatment/Interventions ADLs/Self Care Home  Management;Aquatic Therapy;Cryotherapy;Electrical Stimulation;Moist Heat;Therapeutic exercise;Neuromuscular re-education;Patient/family education;Manual techniques;Taping;Energy conservation;Scar mobilization;Orthotic Fit/Training    PT Next Visit Plan abdominal strengthening progression, manual, scar massage    PT Home Exercise Plan gentle lumbar stretches, mermaid stretch    Consulted and Agree with Plan of Care Patient             Patient will benefit from skilled therapeutic intervention in order to improve the following deficits and impairments:  Abnormal gait, Decreased endurance, Decreased mobility, Difficulty walking, Improper body mechanics, Decreased range of motion, Decreased activity tolerance, Decreased strength, Postural dysfunction, Pain  Visit Diagnosis: Abnormal posture  Chronic low back pain, unspecified back pain laterality, unspecified whether sciatica present     Problem List Patient Active Problem List   Diagnosis Date Noted   Eczema 03/07/2019   H/O total hip arthroplasty 03/07/2019   Primary osteoarthritis of left hip 07/12/2017   Osteopenia of neck of femur 03/24/2017   Hyperlipidemia, mixed 03/17/2017   Low back pain with sciatica 04/08/2015   Colon polyp 09/10/2013  Myles Gip PT, DPT [phone]  04/29/2021, 3:26 PM  [place] [address] [place], [place], [address] Phone: [phone]   Fax:  [phone]  Name: SOLE LENGACHER MRN: [id number] Date of Birth: September 18, 1945

## 2021-05-13 ENCOUNTER — Ambulatory Visit: Payer: Medicare Other | Admitting: Physical Therapy

## 2021-05-13 ENCOUNTER — Encounter: Payer: Self-pay | Admitting: Physical Therapy

## 2021-05-13 ENCOUNTER — Other Ambulatory Visit: Payer: Self-pay

## 2021-05-13 DIAGNOSIS — R293 Abnormal posture: Secondary | ICD-10-CM

## 2021-05-13 DIAGNOSIS — G8929 Other chronic pain: Secondary | ICD-10-CM

## 2021-05-13 NOTE — Therapy (Signed)
[facility] [address]. [place], [place], [address] Phone: [phone]   Fax:  [phone]  Physical Therapy Treatment Physical Therapy Progress Note   Dates of reporting period  01/29/2021   to   05/13/2021   Patient Details  Name: GENAE STRINE MRN: [id number] Date of Birth: 12/26/1945 Referring Provider (PT): Meeler, Whitney   Encounter Date: 05/13/2021   PT End of Session - 05/13/21 1432     Visit Number 20    Number of Visits 30    Date for PT Re-Evaluation 06/24/21    Authorization Type PN 01/29/2021    Authorization Time Period IE 11/20/2020    PT Start Time 1420    PT Stop Time 1500    PT Time Calculation (min) 40 min    Activity Tolerance Patient tolerated treatment well    Behavior During Therapy [facility] for tasks assessed/performed             Past Medical History:  Diagnosis Date   Back pain     Past Surgical History:  Procedure Laterality Date   ABDOMINAL HYSTERECTOMY     COLONOSCOPY WITH PROPOFOL N/A 03/03/2021   Procedure: COLONOSCOPY WITH PROPOFOL;  Surgeon: Lesly Rubenstein, MD;  Location: [facility] ENDOSCOPY;  Service: Endoscopy;  Laterality: N/A;   TOTAL HIP ARTHROPLASTY Left 03/07/2019   Procedure: TOTAL HIP ARTHROPLASTY;  Surgeon: Dereck Leep, MD;  Location: [facility] ORS;  Service: Orthopedics;  Laterality: Left;    There were no vitals filed for this visit.   Subjective Assessment - 05/13/21 1425     Subjective Patient notes that she continues to feel the impact of stress on her back. Patient reports that she had prednisone taper for pain that has helped. Patient is also scheduled for therapeutic massage next Wednesday.    Currently in Pain? Yes    Pain Score 5     Pain Location Back            TREATMENT  Manual Therapy: STM and TPR performed to L posterior hip, L hamstring, and L lumbar region to allow for decreased tension and pain and improved posture and function L Sacral border  mobilizations with movement for decreased spasm and improved mobility, grade II/III L superficial dorsal sacrococcygeal ligament MFR for decreased pain and spasm L innominate posterior mobilizations for decreased pain and improved mobility  Neuromuscular Re-education: Reassessed goals; see below.   Patient educated throughout session on appropriate technique and form using multi-modal cueing, HEP, and activity modification. Patient articulated understanding and returned demonstration.  Patient Response to interventions: Denies any concerns.  ASSESSMENT Patient presents to clinic with excellent motivation to participate in therapy. Patient continues to demonstrate deficits in spinal mobility, posture, postural strength/endurance, and body mechanics. Patient indicating improved activity tolerance and pain response with goal reassessment during today's session and continues to tolerate biweekly treatments to maintain function. In the context of patient's increased caregiving duties, progress while significant has likely been limited and patient has neither indicated nor demonstrated readiness for independent management. Patient will benefit from continued skilled therapeutic intervention to address remaining deficits in spinal mobility, posture, postural strength/endurance, and body mechanics in order to increase function and improve overall QOL.     PT Long Term Goals - 05/13/21 1433       PT LONG TERM GOAL #1   Title Patient will be independent with HEP in order to decrease back pain and increase strength in order to improve pain-free function  at home and work.    Baseline IE: not initiated; 7/28: IND    Time 8    Period Weeks    Status Achieved      PT LONG TERM GOAL #2   Title Patient will demonstrate improved function as evidenced by a score of 59 on FOTO measure for full participation in activities at home and in the community.    Baseline IE: 50; 7/28: 57; 9/28: 56; 11/23:    Time  6    Period Weeks    Status On-going    Target Date 06/24/21      PT LONG TERM GOAL #3   Title Patient will decrease worst pain as reported on NPRS by at least 2 points to demonstrate clinically significant reduction in pain in order to restore/improve function and overall QOL.    Baseline IE: 9/10; 7/28: AM 9/10, PM 6/10; 9/28: AM 7/10, PM 5/10; 11/23: 7/10 AM, 6/10 PM    Time 6    Period Weeks    Status On-going    Target Date 06/24/21      PT LONG TERM GOAL #4   Title Patient will be able to perform ADLs and community activities including laundry, bed making, grocery shopping, meal prep and walking the dog with pain controlled at or below 6/10 on NPRS for 24 hour period in order to improve function and QOL.    Baseline IE: 9/10 pain; 7/28: 7/10 pain; 9/28: 6-7/10 pain (changed linens, meal prep for mother, grocery, laundry); 11/23: 6-7/10 (changed linens, meal prep for mother, grocery, laundry)    Time 6    Period Weeks    Status On-going    Target Date 06/24/21                   Plan - 05/13/21 1432     Clinical Impression Statement Patient presents to clinic with excellent motivation to participate in therapy. Patient continues to demonstrate deficits in spinal mobility, posture, postural strength/endurance, and body mechanics. Patient indicating improved activity tolerance and pain response with goal reassessment during today's session and continues to tolerate biweekly treatments to maintain function. In the context of patient's increased caregiving duties, progress while significant has likely been limited and patient has neither indicated nor demonstrated readiness for independent management. Patient will benefit from continued skilled therapeutic intervention to address remaining deficits in spinal mobility, posture, postural strength/endurance, and body mechanics in order to increase function and improve overall QOL.    Personal Factors and Comorbidities Behavior  Pattern;Comorbidity 3+;Past/Current Experience;Time since onset of injury/illness/exacerbation;Age    Comorbidities mixed hyperlipidemia, L4/5 spinal stenosis, osteopenia, h/o of THA L, colon polyp    Examination-Activity Limitations Transfers;Squat;Bend;Lift;Locomotion Level;Carry;Reach Overhead;Stand    Examination-Participation Restrictions Laundry;Yard Work;Meal Prep;Shop;Cleaning    Stability/Clinical Decision Making Evolving/Moderate complexity    Rehab Potential Fair    PT Frequency 1x / week    PT Duration 8 weeks    PT Treatment/Interventions ADLs/Self Care Home Management;Aquatic Therapy;Cryotherapy;Electrical Stimulation;Moist Heat;Therapeutic exercise;Neuromuscular re-education;Patient/family education;Manual techniques;Taping;Energy conservation;Scar mobilization;Orthotic Fit/Training    PT Next Visit Plan abdominal strengthening progression, manual, scar massage    PT Home Exercise Plan gentle lumbar stretches, mermaid stretch    Consulted and Agree with Plan of Care Patient             Patient will benefit from skilled therapeutic intervention in order to improve the following deficits and impairments:  Abnormal gait, Decreased endurance, Decreased mobility, Difficulty walking, Improper body mechanics, Decreased range of motion,  Decreased activity tolerance, Decreased strength, Postural dysfunction, Pain  Visit Diagnosis: Abnormal posture  Chronic low back pain, unspecified back pain laterality, unspecified whether sciatica present     Problem List Patient Active Problem List   Diagnosis Date Noted   Eczema 03/07/2019   H/O total hip arthroplasty 03/07/2019   Primary osteoarthritis of left hip 07/12/2017   Osteopenia of neck of femur 03/24/2017   Hyperlipidemia, mixed 03/17/2017   Low back pain with sciatica 04/08/2015   Colon polyp 09/10/2013    Myles Gip PT, DPT [phone]  05/13/2021, 3:48 PM  [facility] [address]. [place], [place], [address] Phone: [phone]   Fax:  [phone]  Name: BELISSA KOOY MRN: [id number] Date of Birth: [birth year]

## 2021-05-21 IMAGING — MR MR LUMBAR SPINE W/O CM
5 series · 30 of 48 positions shown · non-contrast
Comparison: 7960

CLINICAL DATA: Bilateral buttock pain into thighs

EXAM:
MRI LUMBAR SPINE WITHOUT CONTRAST
TECHNIQUE: Multiplanar, multisequence MR imaging of the lumbar spine was
performed. No intravenous contrast was administered.

[Series 5: T2 · sagittal · 4.0mm · 0.81mm/px · 6 of 17 slices shown (1 of 2)]
[im 1/17]
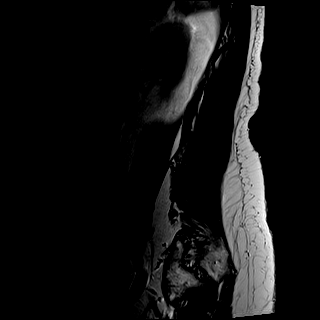
[im 4/17]
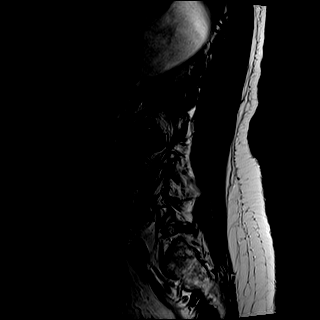
[im 7/17]
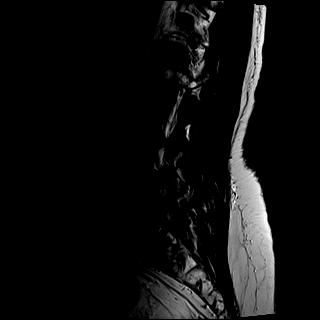
[im 10/17]
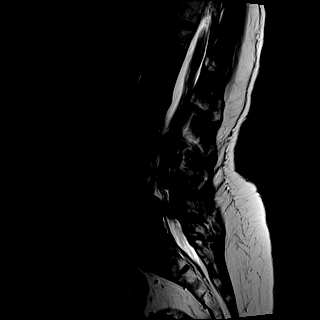
[im 13/17]
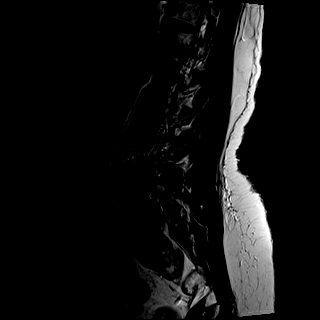
[im 17/17]
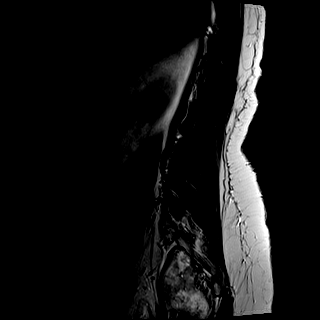

[Series 6: T1 · sagittal · 4.0mm · 0.81mm/px · 7 of 17 slices shown (1 of 2)]
[im 1/17]
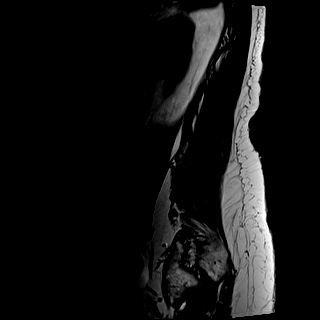
[im 3/17]
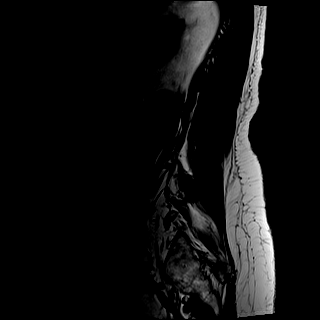
[im 6/17]
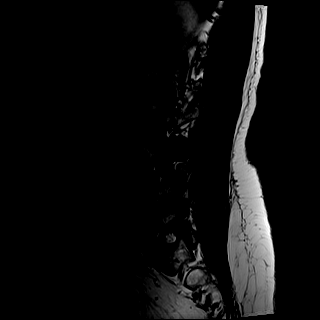
[im 9/17]
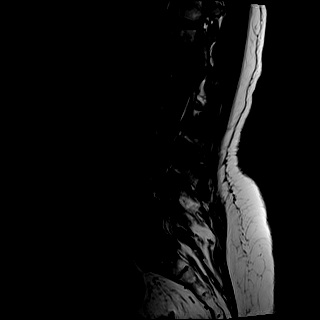
[im 11/17]
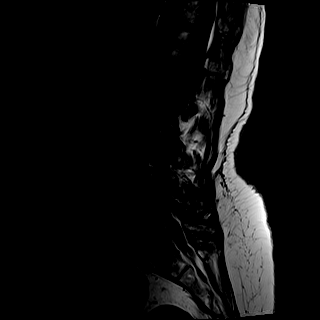
[im 14/17]
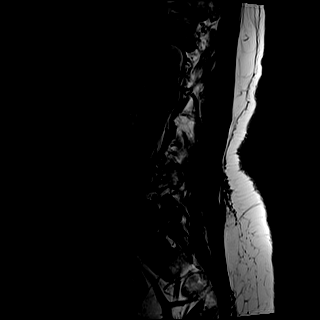
[im 17/17]
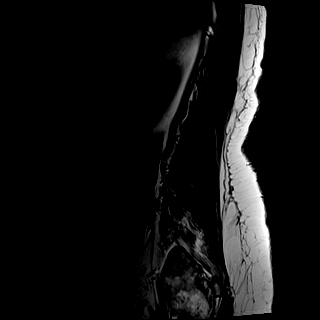

[Series 7: STIR · sagittal · 4.0mm · 0.41mm/px · 1 of 17 slices shown]
[im 1/17]
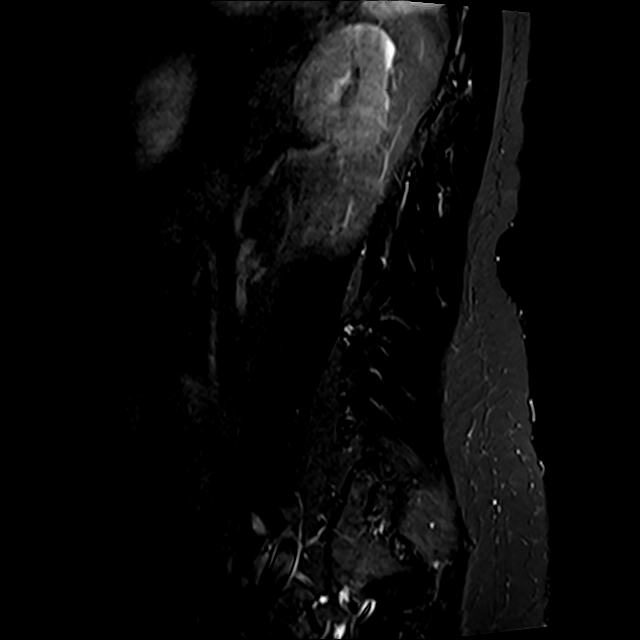

[Series 8: T2 · axial · 4.0mm · 0.78mm/px · z∈[-25,+178]mm · 8 of 36 slices shown (2 of 2)]
[im 1/36]
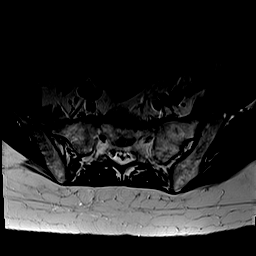
[im 6/36]
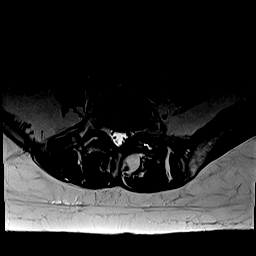
[im 11/36]
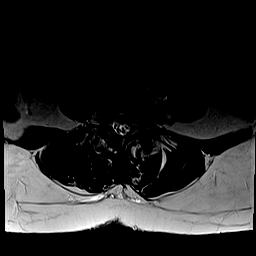
[im 17/36]
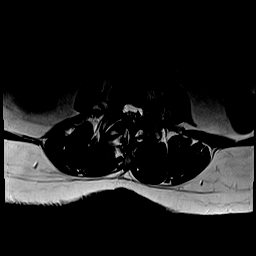
[im 19/36]
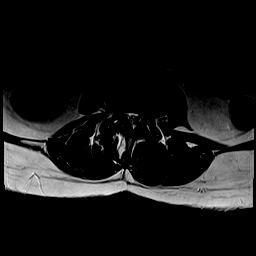
[im 25/36]
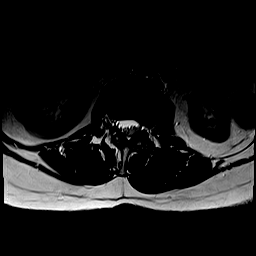
[im 30/36]
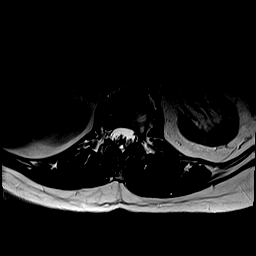
[im 36/36]
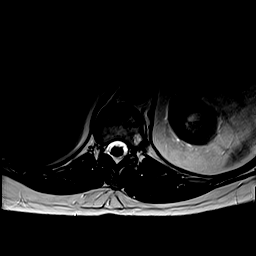

[Series 9: T1 · axial · 4.0mm · 0.39mm/px · z∈[-25,+178]mm · 8 of 36 slices shown (2 of 2)]
[im 1/36]
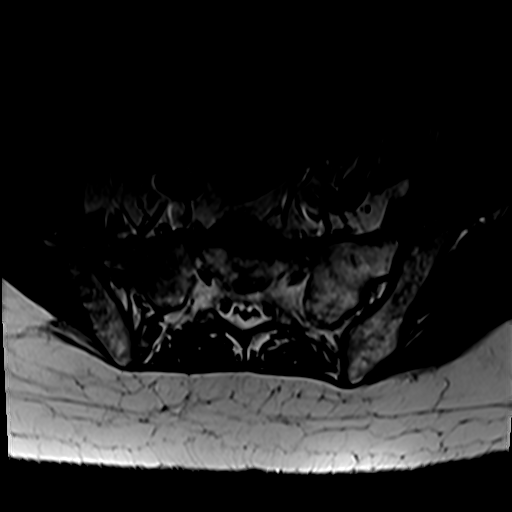
[im 6/36]
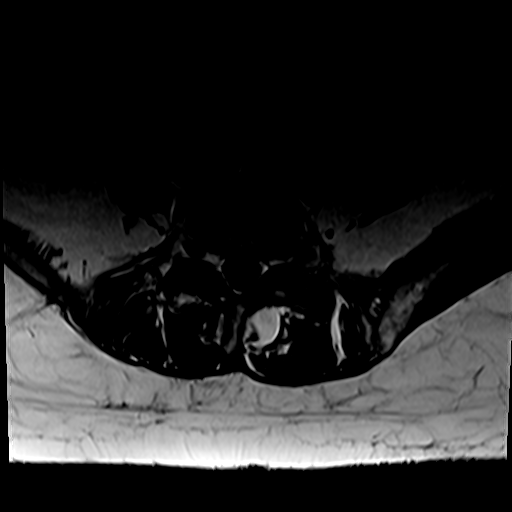
[im 11/36]
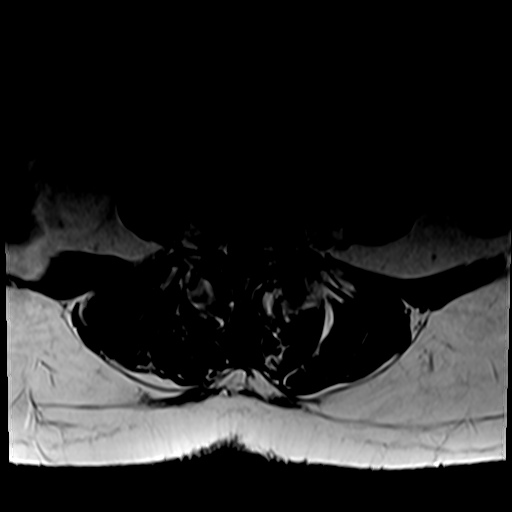
[im 17/36]
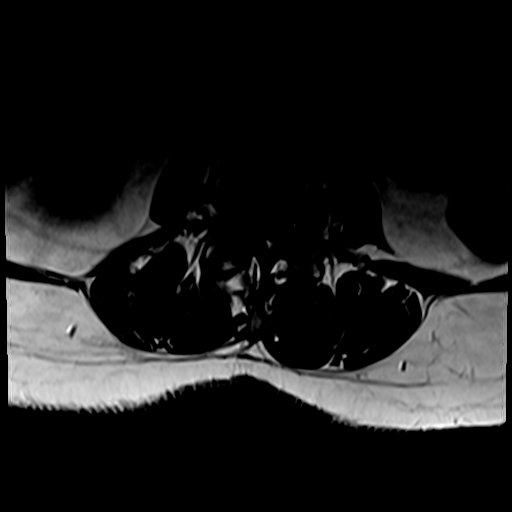
[im 19/36]
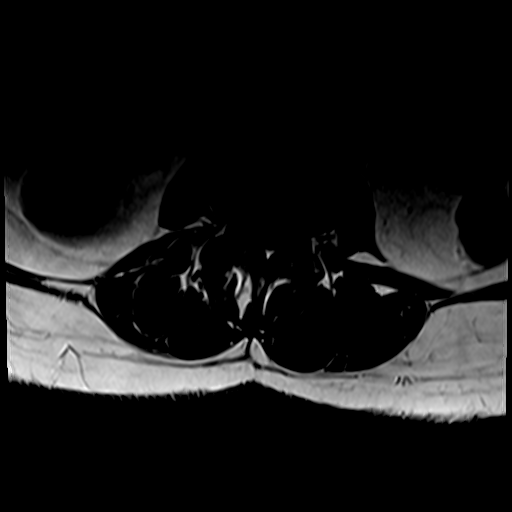
[im 25/36]
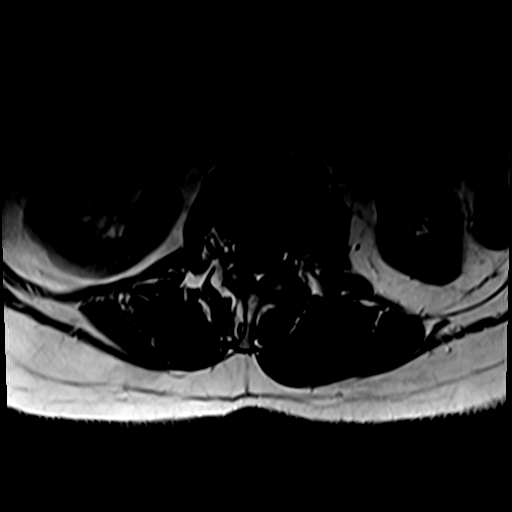
[im 30/36]
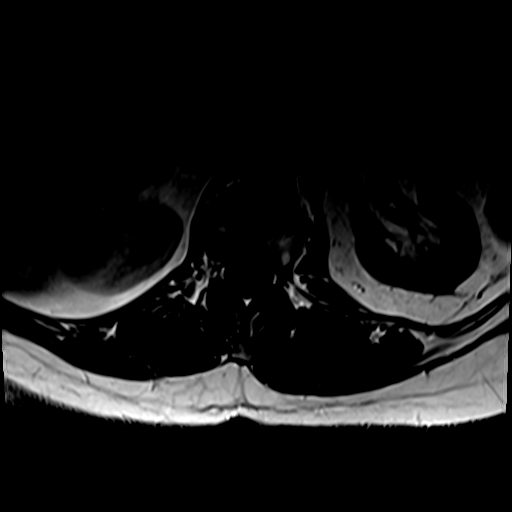
[im 36/36]
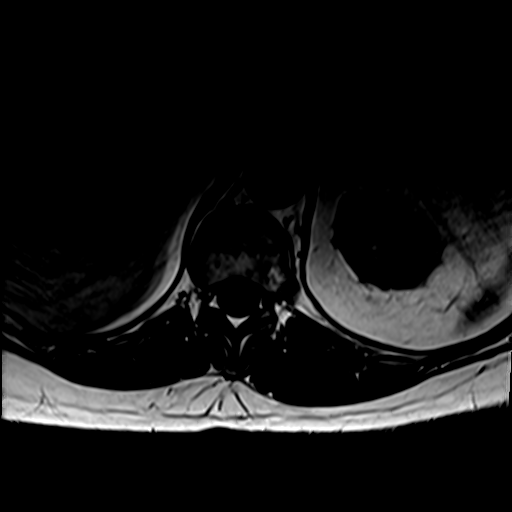

[30 of 48 positions shown; findings below may reference images not displayed]

FINDINGS: Motion artifact is present.

Segmentation: Standard.

Alignment: Similar trace retrolisthesis at L2-L3. Similar trace
anterolisthesis at L3-L4. Increased grade 1 anterolisthesis at
L4-L5.

Vertebrae: Multilevel degenerative endplate irregularity. Vertebral
body heights are otherwise maintained. Few small vertebral body
hemangiomas are incidentally noted. Degenerative endplate marrow
edema at L4-L5 eccentric to the left.

Conus medullaris and cauda equina: Conus extends to the L1 level.
Conus and cauda equina appear normal.

Paraspinal and other soft tissues: Unremarkable.

Disc levels:

L1-L2: Facet arthropathy with ligamentum flavum infolding. No canal
or foraminal stenosis. Appearance is similar.

L2-L3: Disc bulge with prominent foraminal components. Regression of
left central disc protrusion. New right foraminal protrusion. Facet
arthropathy with ligamentum flavum infolding. Decreased mild canal
stenosis. Partial effacement of the subarticular recesses is
decreased on the left. Increased moderate right foraminal stenosis.
Similar mild to moderate left foraminal stenosis.

L3-L4: Disc bulge eccentric to the right. Marked facet arthropathy
with ligamentum flavum infolding. Increased marked canal stenosis.
Increased narrowing of the subarticular recesses. Similar moderate
right and mild to moderate left foraminal stenosis.

L4-L5: Increased anterolisthesis with uncovering of disc bulge
eccentric to the left. Marked facet arthropathy with ligamentum
flavum infolding. Increased marked canal stenosis. Increased
narrowing of the left greater than right subarticular recesses.
Similar mild to moderate right and increased marked left foraminal
stenosis.

L5-S1: Marked facet arthropathy. Previously seen synovial cyst no
longer present. No canal or foraminal stenosis.
IMPRESSION: Multilevel degenerative changes as detailed above. Decreased canal
and left subarticular recess stenosis at L2-L3. Otherwise increased
stenosis at L2-L3, L3-L4, and L4-L5. Canal stenosis at L3-L4 and
L4-L5 is now marked with greater subarticular recess narrowing and
left foraminal stenosis at L4-L5 is now marked.

## 2021-05-27 ENCOUNTER — Encounter: Payer: Self-pay | Admitting: Physical Therapy

## 2021-05-28 ENCOUNTER — Ambulatory Visit: Payer: Medicare Other | Attending: Family Medicine | Admitting: Physical Therapy

## 2021-05-28 ENCOUNTER — Other Ambulatory Visit: Payer: Self-pay

## 2021-05-28 DIAGNOSIS — M545 Low back pain, unspecified: Secondary | ICD-10-CM | POA: Insufficient documentation

## 2021-05-28 DIAGNOSIS — G8929 Other chronic pain: Secondary | ICD-10-CM | POA: Diagnosis present

## 2021-05-28 DIAGNOSIS — R293 Abnormal posture: Secondary | ICD-10-CM | POA: Insufficient documentation

## 2021-05-28 NOTE — Therapy (Signed)
[facility] [address] [place], [place], [address] Phone: [phone]   Fax:  [phone]  Physical Therapy Treatment  Patient Details  Name: Lindsey Freeman MRN: [id number] Date of Birth: Feb 03, 1946 Referring Provider (PT): Meeler, Whitney   Encounter Date: 05/28/2021   PT End of Session - 05/28/21 1415     Visit Number 21    Number of Visits 30    Date for PT Re-Evaluation 06/24/21    Authorization Type PN 01/29/2021    Authorization Time Period IE 11/20/2020    PT Start Time 1410    PT Stop Time 1450    PT Time Calculation (min) 40 min    Activity Tolerance Patient tolerated treatment well    Behavior During Therapy [facility] for tasks assessed/performed             Past Medical History:  Diagnosis Date   Back pain     Past Surgical History:  Procedure Laterality Date   ABDOMINAL HYSTERECTOMY     COLONOSCOPY WITH PROPOFOL N/A 03/03/2021   Procedure: COLONOSCOPY WITH PROPOFOL;  Surgeon: Lesly Rubenstein, MD;  Location: [facility] ENDOSCOPY;  Service: Endoscopy;  Laterality: N/A;   TOTAL HIP ARTHROPLASTY Left 03/07/2019   Procedure: TOTAL HIP ARTHROPLASTY;  Surgeon: Dereck Leep, MD;  Location: [facility] ORS;  Service: Orthopedics;  Laterality: Left;    There were no vitals filed for this visit.   Subjective Assessment - 05/28/21 1413     Subjective Patient presents to clinic with increased RLE pain. Patient notes that pain came on with last day of prednisone taper. Patient thinks she may have overdone physical activity. Patient had massage which helped, but pain return the following day. Patient notes pain is like a toothache.    Currently in Pain? Yes    Pain Score 8     Pain Location Groin    Pain Orientation Right    Pain Descriptors / Indicators Throbbing;Aching             TREATMENT  Manual Therapy: STM and TPR performed to R posterior hip, R hamstring, and R anterior thigh mm to allow for decreased tension and  pain and improved posture and function LAD of RLE for improved joint mobility and decreased pain SAD of R hip for improved joint mobility and decreased pain    Patient educated throughout session on appropriate technique and form using multi-modal cueing, HEP, and activity modification. Patient articulated understanding and returned demonstration.  Patient Response to interventions: Notes decreased tension in R hamstring  ASSESSMENT Patient presents to clinic with excellent motivation to participate in therapy. Patient continues to demonstrate deficits in spinal mobility, posture, postural strength/endurance, and body mechanics. Patient had decreased tension in RLE after manual interventions during today's session but still has considerable antalgic gait at end of session. Due to patient's acute onset of RLE pain and intrasession response to manual, patient and DPT agreed to follow-up session next week to mitigate persistence of RLE complaint. Patient will benefit from continued skilled therapeutic intervention to address remaining deficits in spinal mobility, posture, postural strength/endurance, and body mechanics in order to increase function and improve overall QOL.    PT Long Term Goals - 05/13/21 1433       PT LONG TERM GOAL #1   Title Patient will be independent with HEP in order to decrease back pain and increase strength in order to improve pain-free function at home and work.    Baseline IE:  not initiated; 7/28: IND    Time 8    Period Weeks    Status Achieved      PT LONG TERM GOAL #2   Title Patient will demonstrate improved function as evidenced by a score of 59 on FOTO measure for full participation in activities at home and in the community.    Baseline IE: 50; 7/28: 57; 9/28: 56; 11/23:    Time 6    Period Weeks    Status On-going    Target Date 06/24/21      PT LONG TERM GOAL #3   Title Patient will decrease worst pain as reported on NPRS by at least 2 points to  demonstrate clinically significant reduction in pain in order to restore/improve function and overall QOL.    Baseline IE: 9/10; 7/28: AM 9/10, PM 6/10; 9/28: AM 7/10, PM 5/10; 11/23: 7/10 AM, 6/10 PM    Time 6    Period Weeks    Status On-going    Target Date 06/24/21      PT LONG TERM GOAL #4   Title Patient will be able to perform ADLs and community activities including laundry, bed making, grocery shopping, meal prep and walking the dog with pain controlled at or below 6/10 on NPRS for 24 hour period in order to improve function and QOL.    Baseline IE: 9/10 pain; 7/28: 7/10 pain; 9/28: 6-7/10 pain (changed linens, meal prep for mother, grocery, laundry); 11/23: 6-7/10 (changed linens, meal prep for mother, grocery, laundry)    Time 6    Period Weeks    Status On-going    Target Date 06/24/21                   Plan - 05/28/21 1504     Clinical Impression Statement Patient presents to clinic with excellent motivation to participate in therapy. Patient continues to demonstrate deficits in spinal mobility, posture, postural strength/endurance, and body mechanics. Patient had decreased tension in RLE after manual interventions during today's session but still has considerable antalgic gait at end of session. Due to patient's acute onset of RLE pain and intrasession response to manual, patient and DPT agreed to follow-up session next week to mitigate persistence of RLE complaint. Patient will benefit from continued skilled therapeutic intervention to address remaining deficits in spinal mobility, posture, postural strength/endurance, and body mechanics in order to increase function and improve overall QOL.    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 stenosis, osteopenia, h/o of THA L, colon polyp    Examination-Activity Limitations  Transfers;Squat;Bend;Lift;Locomotion Level;Carry;Reach Overhead;Stand    Examination-Participation Restrictions Laundry;Yard Work;Meal Prep;Shop;Cleaning    Stability/Clinical Decision Making Evolving/Moderate complexity    Rehab Potential Fair    PT Frequency 1x / week    PT Duration 8 weeks    PT Treatment/Interventions ADLs/Self Care Home Management;Aquatic Therapy;Cryotherapy;Electrical Stimulation;Moist Heat;Therapeutic exercise;Neuromuscular re-education;Patient/family education;Manual techniques;Taping;Energy conservation;Scar mobilization;Orthotic Fit/Training    PT Next Visit Plan abdominal strengthening progression, manual, scar massage    PT Home Exercise Plan gentle lumbar stretches, mermaid stretch    Consulted and Agree with Plan of Care Patient             Patient will benefit from skilled therapeutic intervention in order to improve the following deficits and impairments:  Abnormal gait, Decreased endurance, Decreased mobility, Difficulty walking, Improper body mechanics, Decreased range of motion, Decreased activity tolerance, Decreased strength, Postural dysfunction, Pain  Visit Diagnosis: Abnormal posture  Chronic low back pain, unspecified back pain laterality, unspecified whether sciatica present     Problem List Patient Active Problem List   Diagnosis Date Noted   Eczema 03/07/2019   H/O total hip arthroplasty 03/07/2019   Primary osteoarthritis of left hip 07/12/2017   Osteopenia of neck of femur 03/24/2017   Hyperlipidemia, mixed 03/17/2017   Low back pain with sciatica 04/08/2015   Colon polyp 09/10/2013    Myles Gip PT, DPT [phone]  05/28/2021, 3:12 PM  [facility] [address] [place], [place], [address] Phone: [phone]   Fax:  [phone]  Name: Lindsey Freeman MRN: [id number] Date of Birth: 1945-11-22

## 2021-06-02 ENCOUNTER — Ambulatory Visit: Payer: Medicare Other | Admitting: Physical Therapy

## 2021-06-05 ENCOUNTER — Ambulatory Visit: Payer: Medicare Other | Admitting: Physical Therapy

## 2021-06-10 ENCOUNTER — Encounter: Payer: Self-pay | Admitting: Physical Therapy

## 2021-06-10 ENCOUNTER — Other Ambulatory Visit: Payer: Self-pay

## 2021-06-10 ENCOUNTER — Ambulatory Visit: Payer: Medicare Other | Admitting: Physical Therapy

## 2021-06-10 DIAGNOSIS — R293 Abnormal posture: Secondary | ICD-10-CM

## 2021-06-10 DIAGNOSIS — M545 Low back pain, unspecified: Secondary | ICD-10-CM

## 2021-06-10 NOTE — Therapy (Signed)
[place] [facility] [address] [place], [place], [address] Phone: [phone]   Fax:  [phone]  Physical Therapy Treatment  Patient Details  Name: Lindsey Freeman MRN: [id number] Date of Birth: 02-21-1946 Referring Provider (PT): Meeler, Whitney   Encounter Date: 06/10/2021   PT End of Session - 06/10/21 1855     Visit Number 22    Number of Visits 30    Date for PT Re-Evaluation 06/24/21    Authorization Type PN 01/29/2021    Authorization Time Period IE 11/20/2020    PT Start Time 1415    PT Stop Time 1455    PT Time Calculation (min) 40 min    Activity Tolerance Patient tolerated treatment well    Behavior During Therapy [facility] for tasks assessed/performed             Past Medical History:  Diagnosis Date   Back pain     Past Surgical History:  Procedure Laterality Date   ABDOMINAL HYSTERECTOMY     COLONOSCOPY WITH PROPOFOL N/A 03/03/2021   Procedure: COLONOSCOPY WITH PROPOFOL;  Surgeon: Lesly Rubenstein, MD;  Location: [facility] ENDOSCOPY;  Service: Endoscopy;  Laterality: N/A;   TOTAL HIP ARTHROPLASTY Left 03/07/2019   Procedure: TOTAL HIP ARTHROPLASTY;  Surgeon: Dereck Leep, MD;  Location: [facility] ORS;  Service: Orthopedics;  Laterality: Left;    There were no vitals filed for this visit.   Subjective Assessment - 06/10/21 1417     Subjective Patient had adverse reaction to recent massage and was in severe pain for 48 hours s/p massage. Pain was predominantly in R inguinal region and described as a hot sensation.    Currently in Pain? Yes    Pain Score 7     Pain Location Hip    Pain Orientation Right             TREATMENT  Manual Therapy: STM and TPR performed to R hip adductor mm and R anterior thigh mm to allow for decreased tension and pain and improved posture and function LAD of RLE for improved joint mobility and decreased pain SAD of R hip for improved joint mobility and decreased pain Lateral  distraction of R hip with belt for improved joint mobility and decreased pain    Patient educated throughout session on appropriate technique and form using multi-modal cueing, HEP, and activity modification. Patient articulated understanding and returned demonstration.  Patient Response to interventions: Notes feeling looser  ASSESSMENT Patient presents to clinic with excellent motivation to participate in therapy. Patient continues to demonstrate deficits in spinal mobility, posture, postural strength/endurance, and body mechanics. Patient continues to respond desirably to manual interventions at RLE during today's session and was able to achieve longer stride with gait at end of session. Patient will benefit from continued skilled therapeutic intervention to address remaining deficits in spinal mobility, posture, postural strength/endurance, and body mechanics in order to increase function and improve overall QOL.    PT Long Term Goals - 05/13/21 1433       PT LONG TERM GOAL #1   Title Patient will be independent with HEP in order to decrease back pain and increase strength in order to improve pain-free function at home and work.    Baseline IE: not initiated; 7/28: IND    Time 8    Period Weeks    Status Achieved      PT LONG TERM GOAL #2   Title Patient will demonstrate improved function as  evidenced by a score of 59 on FOTO measure for full participation in activities at home and in the community.    Baseline IE: 50; 7/28: 57; 9/28: 56; 11/23:    Time 6    Period Weeks    Status On-going    Target Date 06/24/21      PT LONG TERM GOAL #3   Title Patient will decrease worst pain as reported on NPRS by at least 2 points to demonstrate clinically significant reduction in pain in order to restore/improve function and overall QOL.    Baseline IE: 9/10; 7/28: AM 9/10, PM 6/10; 9/28: AM 7/10, PM 5/10; 11/23: 7/10 AM, 6/10 PM    Time 6    Period Weeks    Status On-going    Target  Date 06/24/21      PT LONG TERM GOAL #4   Title Patient will be able to perform ADLs and community activities including laundry, bed making, grocery shopping, meal prep and walking the dog with pain controlled at or below 6/10 on NPRS for 24 hour period in order to improve function and QOL.    Baseline IE: 9/10 pain; 7/28: 7/10 pain; 9/28: 6-7/10 pain (changed linens, meal prep for mother, grocery, laundry); 11/23: 6-7/10 (changed linens, meal prep for mother, grocery, laundry)    Time 6    Period Weeks    Status On-going    Target Date 06/24/21                   Plan - 06/10/21 1856     Clinical Impression Statement Patient presents to clinic with excellent motivation to participate in therapy. Patient continues to demonstrate deficits in spinal mobility, posture, postural strength/endurance, and body mechanics. Patient continues to respond desirably to manual interventions at RLE during today's session and was able to achieve longer stride with gait at end of session. Patient will benefit from continued skilled therapeutic intervention to address remaining deficits in spinal mobility, posture, postural strength/endurance, and body mechanics in order to increase function and improve overall QOL.    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 stenosis, osteopenia, h/o of THA L, colon polyp    Examination-Activity Limitations Transfers;Squat;Bend;Lift;Locomotion Level;Carry;Reach Overhead;Stand    Examination-Participation Restrictions Laundry;Yard Work;Meal Prep;Shop;Cleaning    Stability/Clinical Decision Making Evolving/Moderate complexity    Rehab Potential Fair    PT Frequency 1x / week    PT Duration 8 weeks    PT Treatment/Interventions ADLs/Self Care Home Management;Aquatic Therapy;Cryotherapy;Electrical Stimulation;Moist  Heat;Therapeutic exercise;Neuromuscular re-education;Patient/family education;Manual techniques;Taping;Energy conservation;Scar mobilization;Orthotic Fit/Training    PT Next Visit Plan abdominal strengthening progression, manual, scar massage    PT Home Exercise Plan gentle lumbar stretches, mermaid stretch    Consulted and Agree with Plan of Care Patient             Patient will benefit from skilled therapeutic intervention in order to improve the following deficits and impairments:  Abnormal gait, Decreased endurance, Decreased mobility, Difficulty walking, Improper body mechanics, Decreased range of motion, Decreased activity tolerance, Decreased strength, Postural dysfunction, Pain  Visit Diagnosis: Abnormal posture  Chronic low back pain, unspecified back pain laterality, unspecified whether sciatica present     Problem List Patient Active Problem List   Diagnosis Date Noted   Eczema 03/07/2019   H/O total hip arthroplasty 03/07/2019   Primary osteoarthritis of left hip 07/12/2017   Osteopenia of neck of femur 03/24/2017  Hyperlipidemia, mixed 03/17/2017   Low back pain with sciatica 04/08/2015   Colon polyp 09/10/2013    Myles Gip PT, DPT [phone]  06/10/2021, 7:00 PM  [place] [facility] [address]. [place], [place], [address] Phone: [phone]   Fax:  [phone]  Name: Lindsey Freeman MRN: [id number] Date of Birth: 11-30-1945

## 2021-06-25 ENCOUNTER — Ambulatory Visit: Payer: Medicare Other | Attending: Family Medicine | Admitting: Physical Therapy

## 2021-06-25 ENCOUNTER — Other Ambulatory Visit: Payer: Self-pay

## 2021-06-25 DIAGNOSIS — M545 Low back pain, unspecified: Secondary | ICD-10-CM | POA: Diagnosis present

## 2021-06-25 DIAGNOSIS — R293 Abnormal posture: Secondary | ICD-10-CM

## 2021-06-25 DIAGNOSIS — G8929 Other chronic pain: Secondary | ICD-10-CM

## 2021-06-25 NOTE — Therapy (Signed)
[place] [facility] [address] [place], [place], [address] Phone: [phone]   Fax:  [phone]  Physical Therapy Treatment  Patient Details  Name: Lindsey Freeman MRN: [id number] Date of Birth: [birth year] Referring Provider (PT): Meeler, Whitney   Encounter Date: 06/25/2021   PT End of Session - 06/25/21 1504     Visit Number 23    Number of Visits 36    Date for PT Re-Evaluation 09/17/21    Authorization Type PN 01/29/2021    Authorization Time Period IE 11/20/2020    PT Start Time 1500    PT Stop Time 1545    PT Time Calculation (min) 45 min    Activity Tolerance Patient tolerated treatment well    Behavior During Therapy [facility] for tasks assessed/performed             Past Medical History:  Diagnosis Date   Back pain     Past Surgical History:  Procedure Laterality Date   ABDOMINAL HYSTERECTOMY     COLONOSCOPY WITH PROPOFOL N/A 03/03/2021   Procedure: COLONOSCOPY WITH PROPOFOL;  Surgeon: Lesly Rubenstein, MD;  Location: [facility] ENDOSCOPY;  Service: Endoscopy;  Laterality: N/A;   TOTAL HIP ARTHROPLASTY Left 03/07/2019   Procedure: TOTAL HIP ARTHROPLASTY;  Surgeon: Dereck Leep, MD;  Location: [facility] ORS;  Service: Orthopedics;  Laterality: Left;    There were no vitals filed for this visit.   Subjective Assessment - 06/25/21 1506     Subjective Patient notes RLE has improved some and has responded well to use of massager at home. Patient notes that after moving mother into ALF she had increased pain (11/10) to the point of being unable to sleep. Patient does note decreased stress with her mom being in a better living situation.    Currently in Pain? Yes    Pain Score 8     Pain Location Back    Pain Orientation Lower            TREATMENT Pre-treatment assessment: + extension rotation test on R Manual Therapy: STM and TPR performed to R lumbar paraspinals and gluteal mm to allow for decreased tension and pain and  improved posture and function IC distraction with MFR of R thoracolumbar fascia Lateral glides, R lumbar for improved joint mobility and decreased pain  Neuromuscular Re-education: Reassessed goals; see below. Patient education on sleeping posture modifications to decrease torque on spine.  Patient educated throughout session on appropriate technique and form using multi-modal cueing, HEP, and activity modification. Patient articulated understanding and returned demonstration.  Patient Response to interventions: Notes feeling looser in the back  ASSESSMENT Patient presents to clinic with excellent motivation to participate in therapy. Patient continues to demonstrate deficits in spinal mobility, posture, postural strength/endurance, and body mechanics. Patient has maintained improved L SIJ pain and function in the presence of increased stress. Patient had acutely improved R groin pain with adjustment to L sidelying posture for improved spinal alignment during today's session and was able to achieve longer stride with gait at end of session. Patient has made significant progress toward goal achievement (see below). Patient's condition has the potential to improve in response to therapy. Maximum improvement is yet to be obtained. Patient will benefit from continued skilled therapeutic intervention to address remaining deficits in spinal mobility, posture, postural strength/endurance, and body mechanics in order to increase function and improve overall QOL.    PT Long Term Goals - 06/25/21 1517  PT LONG TERM GOAL #1   Title Patient will be independent with HEP in order to decrease back pain and increase strength in order to improve pain-free function at home and work.    Baseline IE: not initiated; 7/28: IND    Time 8    Period Weeks    Status Achieved      PT LONG TERM GOAL #2   Title Patient will demonstrate improved function as evidenced by a score of 59 on FOTO measure for full  participation in activities at home and in the community.    Baseline IE: 50; 7/28: 57; 9/28: 56; 11/23:    Time 12    Period Weeks    Status On-going    Target Date 09/17/21      PT LONG TERM GOAL #3   Title Patient will decrease worst pain as reported on NPRS by at least 2 points to demonstrate clinically significant reduction in pain in order to restore/improve function and overall QOL.    Baseline IE: 9/10; 7/28: AM 9/10, PM 6/10; 9/28: AM 7/10, PM 5/10; 11/23: 7/10 AM, 6/10 PM; 1/5: 7/10 AM, 5/10 PM    Time 12    Period Weeks    Status On-going    Target Date 09/17/21      PT LONG TERM GOAL #4   Title Patient will be able to perform ADLs and community activities including laundry, bed making, grocery shopping, meal prep and walking the dog with pain controlled at or below 6/10 on NPRS for 24 hour period in order to improve function and QOL.    Baseline IE: 9/10 pain; 7/28: 7/10 pain; 9/28: 6-7/10 pain (changed linens, meal prep for mother, grocery, laundry); 11/23: 6-7/10 (changed linens, meal prep for mother, grocery, laundry); 1/5: 6/10 (laundry, grocery shopping)    Time 12    Period Weeks    Status On-going    Target Date 09/17/21                   Plan - 06/25/21 1505     Clinical Impression Statement Patient presents to clinic with excellent motivation to participate in therapy. Patient continues to demonstrate deficits in spinal mobility, posture, postural strength/endurance, and body mechanics. Patient has maintained improved L SIJ pain and function in the presence of increased stress. Patient had acutely improved R groin pain with adjustment to L sidelying posture for improved spinal alignment during today's session and was able to achieve longer stride with gait at end of session. Patient has made significant progress toward goal achievement (see below). Patient's condition has the potential to improve in response to therapy. Maximum improvement is yet to be  obtained. Patient will benefit from continued skilled therapeutic intervention to address remaining deficits in spinal mobility, posture, postural strength/endurance, and body mechanics in order to increase function and improve overall QOL.    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 stenosis, osteopenia, h/o of THA L, colon polyp    Examination-Activity Limitations Transfers;Squat;Bend;Lift;Locomotion Level;Carry;Reach Overhead;Stand    Examination-Participation Restrictions Laundry;Yard Work;Meal Prep;Shop;Cleaning    Stability/Clinical Decision Making Evolving/Moderate complexity    Rehab Potential Fair    PT Frequency Biweekly    PT Duration 12 weeks    PT Treatment/Interventions ADLs/Self Care Home Management;Aquatic Therapy;Cryotherapy;Electrical Stimulation;Moist Heat;Therapeutic exercise;Neuromuscular re-education;Patient/family education;Manual techniques;Taping;Energy conservation;Scar mobilization;Orthotic Fit/Training    PT Next Visit Plan abdominal strengthening progression, manual, scar massage  PT Home Exercise Plan gentle lumbar stretches, mermaid stretch    Consulted and Agree with Plan of Care Patient             Patient will benefit from skilled therapeutic intervention in order to improve the following deficits and impairments:  Abnormal gait, Decreased endurance, Decreased mobility, Difficulty walking, Improper body mechanics, Decreased range of motion, Decreased activity tolerance, Decreased strength, Postural dysfunction, Pain  Visit Diagnosis: Abnormal posture  Chronic low back pain, unspecified back pain laterality, unspecified whether sciatica present     Problem List Patient Active Problem List   Diagnosis Date Noted   Eczema 03/07/2019   H/O total hip arthroplasty 03/07/2019   Primary osteoarthritis of  left hip 07/12/2017   Osteopenia of neck of femur 03/24/2017   Hyperlipidemia, mixed 03/17/2017   Low back pain with sciatica 04/08/2015   Colon polyp 09/10/2013    Myles Gip PT, DPT [phone]  06/25/2021, 4:24 PM  [facility] [address] [place], [place], [address] Phone: [phone]   Fax:  [phone]  Name: LILLIEANNA TUOHY MRN: [id number] Date of Birth: 1945-09-26

## 2021-07-08 ENCOUNTER — Ambulatory Visit: Payer: Medicare Other | Admitting: Physical Therapy

## 2021-07-08 ENCOUNTER — Encounter: Payer: Self-pay | Admitting: Physical Therapy

## 2021-07-08 ENCOUNTER — Other Ambulatory Visit: Payer: Self-pay

## 2021-07-08 DIAGNOSIS — M545 Low back pain, unspecified: Secondary | ICD-10-CM

## 2021-07-08 DIAGNOSIS — R293 Abnormal posture: Secondary | ICD-10-CM | POA: Diagnosis not present

## 2021-07-08 NOTE — Therapy (Signed)
[place] [facility] [address]. [place], [place], [address] Phone: [phone]   Fax:  [phone]  Physical Therapy Treatment  Patient Details  Name: Lindsey Freeman MRN: [id number] Date of Birth: February 19, 1946 Referring Provider (PT): Meeler, Whitney   Encounter Date: 07/08/2021   PT End of Session - 07/08/21 1109     Visit Number 24    Number of Visits 36    Date for PT Re-Evaluation 09/17/21    Authorization Type PN 01/29/2021    Authorization Time Period IE 11/20/2020    PT Start Time 1115    PT Stop Time 1155    PT Time Calculation (min) 40 min    Activity Tolerance Patient tolerated treatment well    Behavior During Therapy [facility] for tasks assessed/performed             Past Medical History:  Diagnosis Date   Back pain     Past Surgical History:  Procedure Laterality Date   ABDOMINAL HYSTERECTOMY     COLONOSCOPY WITH PROPOFOL N/A 03/03/2021   Procedure: COLONOSCOPY WITH PROPOFOL;  Surgeon: Lesly Rubenstein, MD;  Location: [facility] ENDOSCOPY;  Service: Endoscopy;  Laterality: N/A;   TOTAL HIP ARTHROPLASTY Left 03/07/2019   Procedure: TOTAL HIP ARTHROPLASTY;  Surgeon: Dereck Leep, MD;  Location: [facility] ORS;  Service: Orthopedics;  Laterality: Left;    There were no vitals filed for this visit.   Subjective Assessment - 07/08/21 1113     Subjective Patient notes she had follow-up with orthopedics which resulted in prescription of meloxicam and tramadol. Patient did short course and had f/u with pain management for epidural injections. Patient had excellent response to epidural but does note increased physical stress/load with packing/moving her mother's home.    Currently in Pain? Yes    Pain Score 7     Pain Location Back    Pain Orientation Lower;Right            TREATMENT  Manual Therapy: STM and TPR performed to R lumbar paraspinals and gluteal mm to allow for decreased tension and pain and improved posture and  function IC distraction with MFR of R thoracolumbar fascia Lateral glides, R lumbar for improved joint mobility and decreased pain Sidelying R LAD for improved joint mobility and decreased pain   Patient educated throughout session on appropriate technique and form using multi-modal cueing, HEP, and activity modification. Patient articulated understanding and returned demonstration.  Patient Response to interventions: Notes feeling looser in the back  ASSESSMENT Patient presents to clinic with excellent motivation to participate in therapy. Patient continues to demonstrate deficits in spinal mobility, posture, postural strength/endurance, and body mechanics. Patient with improved posture s/p manual interventions during today's session and subjectively reported improvement at end of session. Patient will benefit from continued skilled therapeutic intervention to address remaining deficits in spinal mobility, posture, postural strength/endurance, and body mechanics in order to increase function and improve overall QOL.    PT Long Term Goals - 06/25/21 1517       PT LONG TERM GOAL #1   Title Patient will be independent with HEP in order to decrease back pain and increase strength in order to improve pain-free function at home and work.    Baseline IE: not initiated; 7/28: IND    Time 8    Period Weeks    Status Achieved      PT LONG TERM GOAL #2   Title Patient will demonstrate improved function as evidenced  by a score of 59 on FOTO measure for full participation in activities at home and in the community.    Baseline IE: 50; 7/28: 57; 9/28: 56; 11/23:    Time 12    Period Weeks    Status On-going    Target Date 09/17/21      PT LONG TERM GOAL #3   Title Patient will decrease worst pain as reported on NPRS by at least 2 points to demonstrate clinically significant reduction in pain in order to restore/improve function and overall QOL.    Baseline IE: 9/10; 7/28: AM 9/10, PM 6/10;  9/28: AM 7/10, PM 5/10; 11/23: 7/10 AM, 6/10 PM; 1/5: 7/10 AM, 5/10 PM    Time 12    Period Weeks    Status On-going    Target Date 09/17/21      PT LONG TERM GOAL #4   Title Patient will be able to perform ADLs and community activities including laundry, bed making, grocery shopping, meal prep and walking the dog with pain controlled at or below 6/10 on NPRS for 24 hour period in order to improve function and QOL.    Baseline IE: 9/10 pain; 7/28: 7/10 pain; 9/28: 6-7/10 pain (changed linens, meal prep for mother, grocery, laundry); 11/23: 6-7/10 (changed linens, meal prep for mother, grocery, laundry); 1/5: 6/10 (laundry, grocery shopping)    Time 12    Period Weeks    Status On-going    Target Date 09/17/21                   Plan - 07/08/21 1110     Clinical Impression Statement Patient presents to clinic with excellent motivation to participate in therapy. Patient continues to demonstrate deficits in spinal mobility, posture, postural strength/endurance, and body mechanics. Patient with improved posture s/p manual interventions during today's session and subjectively reported improvement at end of session. Patient will benefit from continued skilled therapeutic intervention to address remaining deficits in spinal mobility, posture, postural strength/endurance, and body mechanics in order to increase function and improve overall QOL.    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 stenosis, osteopenia, h/o of THA L, colon polyp    Examination-Activity Limitations Transfers;Squat;Bend;Lift;Locomotion Level;Carry;Reach Overhead;Stand    Examination-Participation Restrictions Laundry;Yard Work;Meal Prep;Shop;Cleaning    Stability/Clinical Decision Making Evolving/Moderate complexity    Rehab Potential Fair    PT Frequency Biweekly    PT  Duration 12 weeks    PT Treatment/Interventions ADLs/Self Care Home Management;Aquatic Therapy;Cryotherapy;Electrical Stimulation;Moist Heat;Therapeutic exercise;Neuromuscular re-education;Patient/family education;Manual techniques;Taping;Energy conservation;Scar mobilization;Orthotic Fit/Training    PT Next Visit Plan abdominal strengthening progression, manual, scar massage    PT Home Exercise Plan gentle lumbar stretches, mermaid stretch    Consulted and Agree with Plan of Care Patient             Patient will benefit from skilled therapeutic intervention in order to improve the following deficits and impairments:  Abnormal gait, Decreased endurance, Decreased mobility, Difficulty walking, Improper body mechanics, Decreased range of motion, Decreased activity tolerance, Decreased strength, Postural dysfunction, Pain  Visit Diagnosis: Abnormal posture  Chronic low back pain, unspecified back pain laterality, unspecified whether sciatica present     Problem List Patient Active Problem List   Diagnosis Date Noted   Eczema 03/07/2019   H/O total hip arthroplasty 03/07/2019   Primary osteoarthritis of left hip 07/12/2017   Osteopenia of neck of femur 03/24/2017   Hyperlipidemia,  mixed 03/17/2017   Low back pain with sciatica 04/08/2015   Colon polyp 09/10/2013    Myles Gip PT, DPT [phone]  07/08/2021, 1:47 PM  [facility] [address] [place], [place], [address] Phone: [phone]   Fax:  [phone]  Name: Lindsey Freeman MRN: [id number] Date of Birth: 03-05-1946

## 2021-07-23 ENCOUNTER — Encounter: Payer: Self-pay | Admitting: Physical Therapy

## 2021-07-23 ENCOUNTER — Other Ambulatory Visit: Payer: Self-pay

## 2021-07-23 ENCOUNTER — Ambulatory Visit: Payer: Medicare Other | Attending: Family Medicine | Admitting: Physical Therapy

## 2021-07-23 DIAGNOSIS — M545 Low back pain, unspecified: Secondary | ICD-10-CM | POA: Diagnosis present

## 2021-07-23 DIAGNOSIS — G8929 Other chronic pain: Secondary | ICD-10-CM | POA: Diagnosis present

## 2021-07-23 DIAGNOSIS — R293 Abnormal posture: Secondary | ICD-10-CM | POA: Diagnosis not present

## 2021-07-23 NOTE — Therapy (Signed)
[facility] [address]. [place], [place], [address] Phone: [phone]   Fax:  [phone]  Physical Therapy Treatment  Patient Details  Name: Lindsey Freeman MRN: [id number] Date of Birth: 03/16/1946 Referring Provider (PT): Meeler, Whitney   Encounter Date: 07/23/2021   PT End of Session - 07/23/21 1132     Visit Number 25    Number of Visits 36    Date for PT Re-Evaluation 09/17/21    Authorization Type PN 01/29/2021    Authorization Time Period IE 11/20/2020    PT Start Time 1120    PT Stop Time 1200    PT Time Calculation (min) 40 min    Activity Tolerance Patient tolerated treatment well    Behavior During Therapy [facility] for tasks assessed/performed             Past Medical History:  Diagnosis Date   Back pain     Past Surgical History:  Procedure Laterality Date   ABDOMINAL HYSTERECTOMY     COLONOSCOPY WITH PROPOFOL N/A 03/03/2021   Procedure: COLONOSCOPY WITH PROPOFOL;  Surgeon: Lesly Rubenstein, MD;  [facility] ENDOSCOPY;  Service: Endoscopy;  Laterality: N/A;   TOTAL HIP ARTHROPLASTY Left 03/07/2019   Procedure: TOTAL HIP ARTHROPLASTY;  Surgeon: Dereck Leep, MD;  Location: [facility] ORS;  Service: Orthopedics;  Laterality: Left;    There were no vitals filed for this visit.   Subjective Assessment - 07/23/21 1123     Subjective Patient notes that she was doing very well Sunday and Monday, but feels she may have overdone it with packing up her mother's home on Tuesday. Patient reports that she paid for this on Tuesday night and Wednesday. Patient did have improved pain and ability to sleep last night.    Currently in Pain? Yes    Pain Score 6     Pain Location Back    Pain Orientation Right;Lower              TREATMENT  Manual Therapy: STM and TPR performed to R lumbar paraspinals and gluteal mm to allow for decreased tension and pain and improved posture and function IC distraction with MFR of R  thoracolumbar fascia Lateral glides, R lumbar for improved joint mobility and decreased pain Sidelying R LAD for improved joint mobility and decreased pain Neuromuscular Re-education: Sidelying, R ITB stretch for improved pain and posture Sideying, R QL stretch for improved pain and posture Sidelying combined ITB and QL stretch for improved pain and posture   Patient educated throughout session on appropriate technique and form using multi-modal cueing, HEP, and activity modification. Patient articulated understanding and returned demonstration.  Patient Response to interventions: Notes feeling able to take longer strides  ASSESSMENT Patient presents to clinic with excellent motivation to participate in therapy. Patient continues to demonstrate deficits in spinal mobility, posture, postural strength/endurance, and body mechanics. Patient with increased tension in R piriformis, R TFL, and R QL during today's session and responded positively to manual interventions with improved stride length. Patient will benefit from continued skilled therapeutic intervention to address remaining deficits in spinal mobility, posture, postural strength/endurance, and body mechanics in order to increase function and improve overall QOL.     PT Long Term Goals - 06/25/21 1517       PT LONG TERM GOAL #1   Title Patient will be independent with HEP in order to decrease back pain and increase strength in order to improve pain-free function at home  and work.    Baseline IE: not initiated; 7/28: IND    Time 8    Period Weeks    Status Achieved      PT LONG TERM GOAL #2   Title Patient will demonstrate improved function as evidenced by a score of 59 on FOTO measure for full participation in activities at home and in the community.    Baseline IE: 50; 7/28: 57; 9/28: 56; 11/23:    Time 12    Period Weeks    Status On-going    Target Date 09/17/21      PT LONG TERM GOAL #3   Title Patient will decrease  worst pain as reported on NPRS by at least 2 points to demonstrate clinically significant reduction in pain in order to restore/improve function and overall QOL.    Baseline IE: 9/10; 7/28: AM 9/10, PM 6/10; 9/28: AM 7/10, PM 5/10; 11/23: 7/10 AM, 6/10 PM; 1/5: 7/10 AM, 5/10 PM    Time 12    Period Weeks    Status On-going    Target Date 09/17/21      PT LONG TERM GOAL #4   Title Patient will be able to perform ADLs and community activities including laundry, bed making, grocery shopping, meal prep and walking the dog with pain controlled at or below 6/10 on NPRS for 24 hour period in order to improve function and QOL.    Baseline IE: 9/10 pain; 7/28: 7/10 pain; 9/28: 6-7/10 pain (changed linens, meal prep for mother, grocery, laundry); 11/23: 6-7/10 (changed linens, meal prep for mother, grocery, laundry); 1/5: 6/10 (laundry, grocery shopping)    Time 12    Period Weeks    Status On-going    Target Date 09/17/21                   Plan - 07/23/21 1132     Clinical Impression Statement Patient presents to clinic with excellent motivation to participate in therapy. Patient continues to demonstrate deficits in spinal mobility, posture, postural strength/endurance, and body mechanics. Patient with increased tension in R piriformis, R TFL, and R QL during today's session and responded positively to manual interventions with improved stride length. Patient will benefit from continued skilled therapeutic intervention to address remaining deficits in spinal mobility, posture, postural strength/endurance, and body mechanics in order to increase function and improve overall QOL.    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 stenosis, osteopenia, h/o of THA L, colon polyp    Examination-Activity Limitations Transfers;Squat;Bend;Lift;Locomotion  Level;Carry;Reach Overhead;Stand    Examination-Participation Restrictions Laundry;Yard Work;Meal Prep;Shop;Cleaning    Stability/Clinical Decision Making Evolving/Moderate complexity    Rehab Potential Fair    PT Frequency Biweekly    PT Duration 12 weeks    PT Treatment/Interventions ADLs/Self Care Home Management;Aquatic Therapy;Cryotherapy;Electrical Stimulation;Moist Heat;Therapeutic exercise;Neuromuscular re-education;Patient/family education;Manual techniques;Taping;Energy conservation;Scar mobilization;Orthotic Fit/Training    PT Next Visit Plan abdominal strengthening progression, manual, scar massage    PT Home Exercise Plan gentle lumbar stretches, mermaid stretch    Consulted and Agree with Plan of Care Patient             Patient will benefit from skilled therapeutic intervention in order to improve the following deficits and impairments:  Abnormal gait, Decreased endurance, Decreased mobility, Difficulty walking, Improper body mechanics, Decreased range of motion, Decreased activity tolerance, Decreased strength, Postural dysfunction, Pain  Visit Diagnosis: Abnormal posture  Chronic low back pain,  unspecified back pain laterality, unspecified whether sciatica present     Problem List Patient Active Problem List   Diagnosis Date Noted   Eczema 03/07/2019   H/O total hip arthroplasty 03/07/2019   Primary osteoarthritis of left hip 07/12/2017   Osteopenia of neck of femur 03/24/2017   Hyperlipidemia, mixed 03/17/2017   Low back pain with sciatica 04/08/2015   Colon polyp 09/10/2013    Myles Gip PT, DPT [phone]  07/23/2021, 6:02 PM  [place] [facility] [address] [place], [place], [address] Phone: [phone]   Fax:  [phone]  Name: Lindsey Freeman MRN: [id number] Date of Birth: 1945/07/19

## 2021-08-10 ENCOUNTER — Encounter: Payer: Self-pay | Admitting: Physical Therapy

## 2021-08-10 ENCOUNTER — Ambulatory Visit: Payer: Medicare Other | Admitting: Physical Therapy

## 2021-08-10 ENCOUNTER — Other Ambulatory Visit: Payer: Self-pay

## 2021-08-10 DIAGNOSIS — G8929 Other chronic pain: Secondary | ICD-10-CM

## 2021-08-10 DIAGNOSIS — R293 Abnormal posture: Secondary | ICD-10-CM | POA: Diagnosis not present

## 2021-08-10 DIAGNOSIS — M545 Low back pain, unspecified: Secondary | ICD-10-CM

## 2021-08-10 NOTE — Therapy (Signed)
[name] [facility] [address] [place], [place], [address] Phone: [phone]   Fax:  [phone]  Physical Therapy Treatment  Patient Details  Name: Lindsey Freeman MRN: [id number] Date of Birth: 06-12-1946 Referring Provider (PT): Meeler, Whitney   Encounter Date: 08/10/2021   PT End of Session - 08/10/21 1602     Visit Number 26    Number of Visits 36    Date for PT Re-Evaluation 09/21/21    Authorization Type PN 01/29/2021    Authorization Time Period IE 11/20/2020    PT Start Time 1545    PT Stop Time 1625    PT Time Calculation (min) 40 min    Activity Tolerance Patient tolerated treatment well    Behavior During Therapy [facility] for tasks assessed/performed             Past Medical History:  Diagnosis Date   Back pain     Past Surgical History:  Procedure Laterality Date   ABDOMINAL HYSTERECTOMY     COLONOSCOPY WITH PROPOFOL N/A 03/03/2021   Procedure: COLONOSCOPY WITH PROPOFOL;  Surgeon: Lesly Rubenstein, MD;  Location: [facility] ENDOSCOPY;  Service: Endoscopy;  Laterality: N/A;   TOTAL HIP ARTHROPLASTY Left 03/07/2019   Procedure: TOTAL HIP ARTHROPLASTY;  Surgeon: Dereck Leep, MD;  Location: [facility] ORS;  Service: Orthopedics;  Laterality: Left;    There were no vitals filed for this visit.   Subjective Assessment - 08/10/21 1550     Subjective Patient reports she has been having increased pain/soreness with packing up her mother's home.    Currently in Pain? Yes    Pain Score 6             TREATMENT  Manual Therapy: STM and TPR performed to R lumbar paraspinals and gluteal mm to allow for decreased tension and pain and improved posture and function IC distraction with MFR of R thoracolumbar fascia Sidelying R LAD for improved joint mobility and decreased pain  Neuromuscular Re-education: Reassessed goals; see below.  Sidelying R hip extension, PT assisted for improved anterior chain length and pelvic posture.    Patient educated throughout session on appropriate technique and form using multi-modal cueing, HEP, and activity modification. Patient articulated understanding and returned demonstration.  Patient Response to interventions: Does not report increased pain  ASSESSMENT Patient presents to clinic with excellent motivation to participate in therapy. Patient continues to demonstrate deficits in spinal mobility, posture, postural strength/endurance, and body mechanics. Patient indicating regression in function with goal reassessment during today's session and has had increased pain as a result of increased physical demand in care of mother's property. Because of the regression in function and symptoms, patient and DPT agreed to adjust frequency of visits until patient's physical demands have resumed at baseline. Patient estimates this to be roughly 6 weeks. Patient's previous successful management of symptoms indicate significant benefit from PT intervention and good response to skilled therapy. Patient will benefit from continued skilled therapeutic intervention to address remaining deficits in spinal mobility, posture, postural strength/endurance, and body mechanics in order to increase function and improve overall QOL.   PT Long Term Goals - 08/10/21 1556       PT LONG TERM GOAL #1   Title Patient will be independent with HEP in order to decrease back pain and increase strength in order to improve pain-free function at home and work.    Baseline IE: not initiated; 7/28: IND    Time 8  Period Weeks    Status Achieved      PT LONG TERM GOAL #2   Title Patient will demonstrate improved function as evidenced by a score of 59 on FOTO measure for full participation in activities at home and in the community.    Baseline IE: 50; 7/28: 57; 9/28: 56; 1/5: 61; 2/20: 46    Time 12    Period Weeks    Status On-going    Target Date 09/21/21      PT LONG TERM GOAL #3   Title Patient will decrease  worst pain as reported on NPRS by at least 2 points to demonstrate clinically significant reduction in pain in order to restore/improve function and overall QOL.    Baseline IE: 9/10; 7/28: AM 9/10, PM 6/10; 9/28: AM 7/10, PM 5/10; 11/23: 7/10 AM, 6/10 PM; 1/5: 7/10 AM, 5/10 PM; 2/20: 7/10 AM, 9/10 PM    Time 6    Period Weeks    Status On-going    Target Date 09/21/21      PT LONG TERM GOAL #4   Title Patient will be able to perform ADLs and community activities including laundry, bed making, grocery shopping, meal prep and walking the dog with pain controlled at or below 6/10 on NPRS for 24 hour period in order to improve function and QOL.    Baseline IE: 9/10 pain; 7/28: 7/10 pain; 9/28: 6-7/10 pain (changed linens, meal prep for mother, grocery, laundry); 11/23: 6-7/10 (changed linens, meal prep for mother, grocery, laundry); 1/5: 6/10 (laundry, grocery shopping); 2/20: 8-9/10 pain pakcin gup mother's home    Time 6    Period Weeks    Status On-going    Target Date 09/21/21                   Plan - 08/10/21 1556     Clinical Impression Statement Patient presents to clinic with excellent motivation to participate in therapy. Patient continues to demonstrate deficits in spinal mobility, posture, postural strength/endurance, and body mechanics. Patient indicating regression in function with goal reassessment during today's session and has had increased pain as a result of increased physical demand in care of mother's property. Because of the regression in function and symptoms, patient and DPT agreed to adjust frequency of visits until patient's physical demands have resumed at baseline. Patient estimates this to be roughly 6 weeks. Patient's previous successful management of symptoms indicate significant benefit from PT intervention and good response to skilled therapy. Patient will benefit from continued skilled therapeutic intervention to address remaining deficits in spinal mobility,  posture, postural strength/endurance, and body mechanics in order to increase function and improve overall QOL.    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 stenosis, osteopenia, h/o of THA L, colon polyp    Examination-Activity Limitations Transfers;Squat;Bend;Lift;Locomotion Level;Carry;Reach Overhead;Stand    Examination-Participation Restrictions Laundry;Yard Work;Meal Prep;Shop;Cleaning    Stability/Clinical Decision Making Evolving/Moderate complexity    Rehab Potential Fair    PT Frequency 1x / week    PT Duration 6 weeks    PT Treatment/Interventions ADLs/Self Care Home Management;Aquatic Therapy;Cryotherapy;Electrical Stimulation;Moist Heat;Therapeutic exercise;Neuromuscular re-education;Patient/family education;Manual techniques;Taping;Energy conservation;Scar mobilization;Orthotic Fit/Training    PT Next Visit Plan abdominal strengthening progression, manual, scar massage    PT Home Exercise Plan gentle lumbar stretches, mermaid stretch    Consulted and Agree with Plan of Care Patient  Patient will benefit from skilled therapeutic intervention in order to improve the following deficits and impairments:  Abnormal gait, Decreased endurance, Decreased mobility, Difficulty walking, Improper body mechanics, Decreased range of motion, Decreased activity tolerance, Decreased strength, Postural dysfunction, Pain  Visit Diagnosis: Abnormal posture  Chronic low back pain, unspecified back pain laterality, unspecified whether sciatica present     Problem List Patient Active Problem List   Diagnosis Date Noted   Eczema 03/07/2019   H/O total hip arthroplasty 03/07/2019   Primary osteoarthritis of left hip 07/12/2017   Osteopenia of neck of femur 03/24/2017   Hyperlipidemia, mixed 03/17/2017   Low back pain with sciatica  04/08/2015   Colon polyp 09/10/2013   Myles Gip PT, DPT [phone]  08/10/2021, 6:19 PM  [place] [facility] [address] [place], [place], [address] Phone: [phone]   Fax:  [phone]  Name: Lindsey Freeman MRN: [id number] Date of Birth: 30-May-1946

## 2021-08-19 ENCOUNTER — Other Ambulatory Visit: Payer: Self-pay

## 2021-08-19 ENCOUNTER — Encounter: Payer: Self-pay | Admitting: Physical Therapy

## 2021-08-19 ENCOUNTER — Ambulatory Visit: Payer: Medicare Other | Attending: Family Medicine | Admitting: Physical Therapy

## 2021-08-19 DIAGNOSIS — R293 Abnormal posture: Secondary | ICD-10-CM

## 2021-08-19 DIAGNOSIS — M545 Low back pain, unspecified: Secondary | ICD-10-CM

## 2021-08-19 DIAGNOSIS — G8929 Other chronic pain: Secondary | ICD-10-CM | POA: Diagnosis present

## 2021-08-19 NOTE — Therapy (Signed)
[place] ?[facility] ?[address] [place], [place], [address] ?Phone: [phone]   Fax:  [phone] ? ?Physical Therapy Treatment ? ?Patient Details  ?Name: Lindsey Freeman ?MRN: [id number] ?Date of Birth: [birth year] ?Referring Provider (PT): Meeler, Whitney ? ? ?Encounter Date: 08/19/2021 ? ? PT End of Session - 08/19/21 1334   ? ? Visit Number 27   ? Number of Visits 36   ? Date for PT Re-Evaluation 09/21/21   ? Authorization Type PN 01/29/2021   ? Authorization Time Period IE 11/20/2020   ? PT Start Time 1330   ? PT Stop Time 1410   ? PT Time Calculation (min) 40 min   ? Activity Tolerance Patient tolerated treatment well   ? Behavior During Therapy [facility] for tasks assessed/performed   ? ?  ?  ? ?  ? ? ?Past Medical History:  ?Diagnosis Date  ? Back pain   ? ? ?Past Surgical History:  ?Procedure Laterality Date  ? ABDOMINAL HYSTERECTOMY    ? COLONOSCOPY WITH PROPOFOL N/A 03/03/2021  ? Procedure: COLONOSCOPY WITH PROPOFOL;  Surgeon: Lesly Rubenstein, MD;  Location: [facility] ENDOSCOPY;  Service: Endoscopy;  Laterality: N/A;  ? TOTAL HIP ARTHROPLASTY Left 03/07/2019  ? Procedure: TOTAL HIP ARTHROPLASTY;  Surgeon: Dereck Leep, MD;  Location: [facility] ORS;  Service: Orthopedics;  Laterality: Left;  ? ? ?There were no vitals filed for this visit. ? ? Subjective Assessment - 08/19/21 1332   ? ? Subjective Patient reports that she isn't doing terribly today after a busy day of moving/packing. Patient took hydrocodone before bed which she feels may have helped.   ? Currently in Pain? Yes   ? Pain Score 6    ? Pain Location Back   ? ?  ?  ? ?  ? ?TREATMENT ? ?Manual Therapy: ?STM and TPR performed to R lumbar paraspinals and gluteal mm to allow for decreased tension and pain and improved posture and function ?IC distraction with MFR of R thoracolumbar fascia ?Supine R LAD for improved joint mobility and decreased pain ? ?Therapeutic Exercise: ?Supine hamstring stretch, B, PT assisted for  improved posture ?Supine popliteal angle stretch, B, PT assisted for improved posture ?Supine adductor stretch, B, PT assisted ?Supine piriformis stretch, B, PT assisted ?Sidelying R hip extension, PT assisted for improved anterior chain length and pelvic posture.  ? ?Patient educated throughout session on appropriate technique and form using multi-modal cueing, HEP, and activity modification. Patient articulated understanding and returned demonstration. ? ?Patient Response to interventions: ?Does not report increased pain ? ?ASSESSMENT ?Patient presents to clinic with excellent motivation to participate in therapy. Patient continues to demonstrate deficits in spinal mobility, posture, postural strength/endurance, and body mechanics. Patient had subjective improvement in symptoms after manual and gentle stretching during today's session and responded well to education on managing overall physical load with respect to packing demands and physical therapy HEP. Patient will benefit from continued skilled therapeutic intervention to address remaining deficits in spinal mobility, posture, postural strength/endurance, and body mechanics in order to increase function and improve overall QOL. ? ? PT Long Term Goals - 08/10/21 1556   ? ?  ? PT LONG TERM GOAL #1  ? Title Patient will be independent with HEP in order to decrease back pain and increase strength in order to improve pain-free function at home and work.   ? Baseline IE: not initiated; 7/28: IND   ? Time 8   ? Period Weeks   ?  Status Achieved   ?  ? PT LONG TERM GOAL #2  ? Title Patient will demonstrate improved function as evidenced by a score of 59 on FOTO measure for full participation in activities at home and in the community.   ? Baseline IE: 50; 7/28: 57; 9/28: 56; 1/5: 61; 2/20: 46   ? Time 12   ? Period Weeks   ? Status On-going   ? Target Date 09/21/21   ?  ? PT LONG TERM GOAL #3  ? Title Patient will decrease worst pain as reported on NPRS by at least 2  points to demonstrate clinically significant reduction in pain in order to restore/improve function and overall QOL.   ? Baseline IE: 9/10; 7/28: AM 9/10, PM 6/10; 9/28: AM 7/10, PM 5/10; 11/23: 7/10 AM, 6/10 PM; 1/5: 7/10 AM, 5/10 PM; 2/20: 7/10 AM, 9/10 PM   ? Time 6   ? Period Weeks   ? Status On-going   ? Target Date 09/21/21   ?  ? PT LONG TERM GOAL #4  ? Title Patient will be able to perform ADLs and community activities including laundry, bed making, grocery shopping, meal prep and walking the dog with pain controlled at or below 6/10 on NPRS for 24 hour period in order to improve function and QOL.   ? Baseline IE: 9/10 pain; 7/28: 7/10 pain; 9/28: 6-7/10 pain (changed linens, meal prep for mother, grocery, laundry); 11/23: 6-7/10 (changed linens, meal prep for mother, grocery, laundry); 1/5: 6/10 (laundry, grocery shopping); 2/20: 8-9/10 pain pakcin gup mother's home   ? Time 6   ? Period Weeks   ? Status On-going   ? Target Date 09/21/21   ? ?  ?  ? ?  ? ? ? ? ? ? ? ? Plan - 08/19/21 1334   ? ? Clinical Impression Statement Patient presents to clinic with excellent motivation to participate in therapy. Patient continues to demonstrate deficits in spinal mobility, posture, postural strength/endurance, and body mechanics. Patient had subjective improvement in symptoms after manual and gentle stretching during today's session and responded well to education on managing overall physical load with respect to packing demands and physical therapy HEP. Patient will benefit from continued skilled therapeutic intervention to address remaining deficits in spinal mobility, posture, postural strength/endurance, and body mechanics in order to increase function and improve overall QOL.   ? Personal Factors and Comorbidities Behavior Pattern;Comorbidity 3+;Past/Current Experience;Time since onset of injury/illness/exacerbation;Age   ? Comorbidities mixed hyperlipidemia, L4/5 spinal stenosis, osteopenia, h/o of THA L, colon  polyp   ? Examination-Activity Limitations Transfers;Squat;Bend;Lift;Locomotion Level;Carry;Reach Overhead;Stand   ? Examination-Participation Restrictions Laundry;Yard Work;Meal Prep;Shop;Cleaning   ? Stability/Clinical Decision Making Evolving/Moderate complexity   ? Rehab Potential Fair   ? PT Frequency 1x / week   ? PT Duration 6 weeks   ? PT Treatment/Interventions ADLs/Self Care Home Management;Aquatic Therapy;Cryotherapy;Electrical Stimulation;Moist Heat;Therapeutic exercise;Neuromuscular re-education;Patient/family education;Manual techniques;Taping;Energy conservation;Scar mobilization;Orthotic Fit/Training   ? PT Next Visit Plan abdominal strengthening progression, manual, scar massage   ? PT Home Exercise Plan gentle lumbar stretches, mermaid stretch   ? Consulted and Agree with Plan of Care Patient   ? ?  ?  ? ?  ? ? ?Patient will benefit from skilled therapeutic intervention in order to improve the following deficits and impairments:  Abnormal gait, Decreased endurance, Decreased mobility, Difficulty walking, Improper body mechanics, Decreased range of motion, Decreased activity tolerance, Decreased strength, Postural dysfunction, Pain ? ?Visit Diagnosis: ?Abnormal posture ? ?Chronic low back  pain, unspecified back pain laterality, unspecified whether sciatica present ? ? ? ? ?Problem List ?Patient Active Problem List  ? Diagnosis Date Noted  ? Eczema 03/07/2019  ? 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 Primary osteoarthritis of left hip 07/12/2017  ? Osteopenia of neck of femur 03/24/2017  ? Hyperlipidemia, mixed 03/17/2017  ? Low back pain with sciatica 04/08/2015  ? Colon polyp 09/10/2013  ? ?Myles Gip PT, DPT [id number]  ?08/19/2021, 5:02 PM ? ?[place] ?[facility] ?[address] [place], [place], [address] ?Phone: [phone]   Fax:  [phone] ? ?Name: Lindsey Freeman ?MRN: [id number] ?Date of Birth: March 13, 1946 ? ? ? ?

## 2021-08-26 ENCOUNTER — Ambulatory Visit: Payer: Medicare Other | Admitting: Physical Therapy

## 2021-08-26 ENCOUNTER — Encounter: Payer: Self-pay | Admitting: Physical Therapy

## 2021-08-26 ENCOUNTER — Other Ambulatory Visit: Payer: Self-pay

## 2021-08-26 DIAGNOSIS — G8929 Other chronic pain: Secondary | ICD-10-CM

## 2021-08-26 DIAGNOSIS — M545 Low back pain, unspecified: Secondary | ICD-10-CM

## 2021-08-26 DIAGNOSIS — R293 Abnormal posture: Secondary | ICD-10-CM | POA: Diagnosis not present

## 2021-08-26 NOTE — Therapy (Signed)
[place] ?[facility] ?[address] [place], [place], [address] ?Phone: [phone]   Fax:  [phone] ? ?Physical Therapy Treatment ? ?Patient Details  ?Name: Lindsey Freeman ?MRN: [id number] ?Date of Birth: July 05, 1945 ?Referring Provider (PT): Meeler, Whitney ? ? ?Encounter Date: 08/26/2021 ? ? PT End of Session - 08/26/21 1337   ? ? Visit Number 28   ? Number of Visits 36   ? Date for PT Re-Evaluation 09/21/21   ? Authorization Type PN 01/29/2021   ? Authorization Time Period IE 11/20/2020   ? PT Start Time 1330   ? PT Stop Time 1410   ? PT Time Calculation (min) 40 min   ? Activity Tolerance Patient tolerated treatment well   ? Behavior During Therapy [facility] for tasks assessed/performed   ? ?  ?  ? ?  ? ? ?Past Medical History:  ?Diagnosis Date  ? Back pain   ? ? ?Past Surgical History:  ?Procedure Laterality Date  ? ABDOMINAL HYSTERECTOMY    ? COLONOSCOPY WITH PROPOFOL N/A 03/03/2021  ? Procedure: COLONOSCOPY WITH PROPOFOL;  Surgeon: Lesly Rubenstein, MD;  Location: [facility] ENDOSCOPY;  Service: Endoscopy;  Laterality: N/A;  ? TOTAL HIP ARTHROPLASTY Left 03/07/2019  ? Procedure: TOTAL HIP ARTHROPLASTY;  Surgeon: Dereck Leep, MD;  Location: [facility] ORS;  Service: Orthopedics;  Laterality: Left;  ? ? ?There were no vitals filed for this visit. ? ? Subjective Assessment - 08/26/21 1333   ? ? Subjective Patient states that after a busy weekend hosting an open house and organzing her mother's home. Patient has been limping since. Pain is located in the superior gluteals R>L.   ? Currently in Pain? Yes   ? Pain Score 7    ? Pain Location Buttocks   ? Pain Orientation Upper   ? ?  ?  ? ?  ? ?TREATMENT ? ?Manual Therapy: ?STM and TPR performed to R lumbar paraspinals and gluteal mm to allow for decreased tension and pain and improved posture and function ?IC distraction with MFR of R thoracolumbar fascia ?Sidelying sacral mobilizations for improved mobility and decreased pain ?Supine R LAD for  improved joint mobility and decreased pain ?STM and TPR performed to R anterior thigh to allow for decreased tension and pain and improved posture and function ? ? ? ?Patient educated throughout session on appropriate technique and form using multi-modal cueing, HEP, and activity modification. Patient articulated understanding and returned demonstration. ? ?Patient Response to interventions: ?Does not report increased pain ? ?ASSESSMENT ?Patient presents to clinic with excellent motivation to participate in therapy. Patient continues to demonstrate deficits in spinal mobility, posture, postural strength/endurance, and body mechanics. Patient with improved stride length after manual interventions during today's session. Patient will benefit from continued skilled therapeutic intervention to address remaining deficits in spinal mobility, posture, postural strength/endurance, and body mechanics in order to increase function and improve overall QOL. ? ? PT Long Term Goals - 08/10/21 1556   ? ?  ? PT LONG TERM GOAL #1  ? Title Patient will be independent with HEP in order to decrease back pain and increase strength in order to improve pain-free function at home and work.   ? Baseline IE: not initiated; 7/28: IND   ? Time 8   ? Period Weeks   ? Status Achieved   ?  ? PT LONG TERM GOAL #2  ? Title Patient will demonstrate improved function as evidenced by a score of 59 on  FOTO measure for full participation in activities at home and in the community.   ? Baseline IE: 50; 7/28: 57; 9/28: 56; 1/5: 61; 2/20: 46   ? Time 12   ? Period Weeks   ? Status On-going   ? Target Date 09/21/21   ?  ? PT LONG TERM GOAL #3  ? Title Patient will decrease worst pain as reported on NPRS by at least 2 points to demonstrate clinically significant reduction in pain in order to restore/improve function and overall QOL.   ? Baseline IE: 9/10; 7/28: AM 9/10, PM 6/10; 9/28: AM 7/10, PM 5/10; 11/23: 7/10 AM, 6/10 PM; 1/5: 7/10 AM, 5/10 PM; 2/20:  7/10 AM, 9/10 PM   ? Time 6   ? Period Weeks   ? Status On-going   ? Target Date 09/21/21   ?  ? PT LONG TERM GOAL #4  ? Title Patient will be able to perform ADLs and community activities including laundry, bed making, grocery shopping, meal prep and walking the dog with pain controlled at or below 6/10 on NPRS for 24 hour period in order to improve function and QOL.   ? Baseline IE: 9/10 pain; 7/28: 7/10 pain; 9/28: 6-7/10 pain (changed linens, meal prep for mother, grocery, laundry); 11/23: 6-7/10 (changed linens, meal prep for mother, grocery, laundry); 1/5: 6/10 (laundry, grocery shopping); 2/20: 8-9/10 pain pakcin gup mother's home   ? Time 6   ? Period Weeks   ? Status On-going   ? Target Date 09/21/21   ? ?  ?  ? ?  ? ? ? ? ? ? ? ? Plan - 08/26/21 1456   ? ? Clinical Impression Statement Patient presents to clinic with excellent motivation to participate in therapy. Patient continues to demonstrate deficits in spinal mobility, posture, postural strength/endurance, and body mechanics. Patient with improved stride length after manual interventions during today's session. Patient will benefit from continued skilled therapeutic intervention to address remaining deficits in spinal mobility, posture, postural strength/endurance, and body mechanics in order to increase function and improve overall QOL.   ? Personal Factors and Comorbidities Behavior Pattern;Comorbidity 3+;Past/Current Experience;Time since onset of injury/illness/exacerbation;Age   ? 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 Examination-Activity Limitations Transfers;Squat;Bend;Lift;Locomotion Level;Carry;Reach Overhead;Stand   ? Examination-Participation Restrictions Laundry;Yard Work;Meal Prep;Shop;Cleaning   ? Stability/Clinical Decision Making Evolving/Moderate complexity   ? Rehab Potential Fair   ? PT Frequency 1x / week   ? PT Duration 6 weeks   ? PT Treatment/Interventions ADLs/Self  Care Home Management;Aquatic Therapy;Cryotherapy;Electrical Stimulation;Moist Heat;Therapeutic exercise;Neuromuscular re-education;Patient/family education;Manual techniques;Taping;Energy conservation;Scar mobilization;Orthotic Fit/Training   ? PT Next Visit Plan abdominal strengthening progression, manual, scar massage   ? PT Home Exercise Plan gentle lumbar stretches, mermaid stretch   ? Consulted and Agree with Plan of Care Patient   ? ?  ?  ? ?  ? ? ?Patient will benefit from skilled therapeutic intervention in order to improve the following deficits and impairments:  Abnormal gait, Decreased endurance, Decreased mobility, Difficulty walking, Improper body mechanics, Decreased range of motion, Decreased activity tolerance, Decreased strength, Postural dysfunction, Pain ? ?Visit Diagnosis: ?Abnormal posture ? ?Chronic low back pain, unspecified back pain laterality, unspecified whether sciatica present ? ? ? ? ?Problem List ?Patient Active Problem List  ? Diagnosis Date Noted  ? Eczema 03/07/2019  ? 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 Primary osteoarthritis of left hip 07/12/2017  ? Osteopenia of neck of femur  03/24/2017  ? Hyperlipidemia, mixed 03/17/2017  ? Low back pain with sciatica 04/08/2015  ? Colon polyp 09/10/2013  ? ?Myles Gip PT, DPT [phone]  ?08/26/2021, 2:59 PM ? ?[place] ?[facility] ?[address] [place], [place], [address] ?Phone: [phone]   Fax:  [phone] ? ?Name: BRITTANY OSIER ?MRN: [id number] ?Date of Birth: 1946-05-04 ? ? ? ?

## 2021-09-02 ENCOUNTER — Other Ambulatory Visit: Payer: Self-pay

## 2021-09-02 ENCOUNTER — Encounter: Payer: Self-pay | Admitting: Physical Therapy

## 2021-09-02 ENCOUNTER — Ambulatory Visit: Payer: Medicare Other | Admitting: Physical Therapy

## 2021-09-02 DIAGNOSIS — G8929 Other chronic pain: Secondary | ICD-10-CM

## 2021-09-02 DIAGNOSIS — R293 Abnormal posture: Secondary | ICD-10-CM

## 2021-09-02 NOTE — Therapy (Signed)
[place] ?[facility] ?[address] [place], [place], [address] ?Phone: [phone]   Fax:  [phone] ? ?Physical Therapy Treatment ? ?Patient Details  ?Name: Lindsey Freeman ?MRN: [id number] ?Date of Birth: 1946/01/27 ?Referring Provider (PT): Meeler, Whitney ? ? ?Encounter Date: 09/02/2021 ? ? PT End of Session - 09/02/21 1555   ? ? Visit Number 29   ? Number of Visits 36   ? Date for PT Re-Evaluation 09/21/21   ? Authorization Type PN 01/29/2021   ? Authorization Time Period IE 11/20/2020   ? PT Start Time 1545   ? PT Stop Time 1625   ? PT Time Calculation (min) 40 min   ? Activity Tolerance Patient tolerated treatment well   ? Behavior During Therapy [facility] for tasks assessed/performed   ? ?  ?  ? ?  ? ? ?Past Medical History:  ?Diagnosis Date  ? Back pain   ? ? ?Past Surgical History:  ?Procedure Laterality Date  ? ABDOMINAL HYSTERECTOMY    ? COLONOSCOPY WITH PROPOFOL N/A 03/03/2021  ? Procedure: COLONOSCOPY WITH PROPOFOL;  Surgeon: Lesly Rubenstein, MD;  Location: [facility] ENDOSCOPY;  Service: Endoscopy;  Laterality: N/A;  ? TOTAL HIP ARTHROPLASTY Left 03/07/2019  ? Procedure: TOTAL HIP ARTHROPLASTY;  Surgeon: Dereck Leep, MD;  Location: [facility] ORS;  Service: Orthopedics;  Laterality: Left;  ? ? ?There were no vitals filed for this visit. ? ? Subjective Assessment - 09/02/21 1548   ? ? Subjective Patient notes that she took 2 tylenol this morning which has been helping some. Patient notes that she trialed cymbalta as a replacement for gabapentin but had an adverse reaction (nausea, dry mouth, GI upset).   ? Currently in Pain? Yes   ? Pain Score 6    ? Pain Location Sacrum   ? Pain Orientation Medial   ? ?  ?  ? ?  ? ?TREATMENT ? ?Manual Therapy: ?STM and TPR performed to R lumbar paraspinals and gluteal mm to allow for decreased tension and pain and improved posture and function ?IC distraction with MFR of R thoracolumbar fascia ?Sidelying and prone sacral mobilizations for  improved mobility and decreased pain ?Prone R LAD for improved joint mobility and decreased pain ?STM and TPR performed to R inferior gluteals and obturator to allow for decreased tension and pain and improved posture and function ?Patient education on self-release techniques using tennis ball and gentle AROM hip rotations in sitting posture. ? ? ?Patient educated throughout session on appropriate technique and form using multi-modal cueing, HEP, and activity modification. Patient articulated understanding and returned demonstration. ? ?Patient Response to interventions: ?Notes improved gait ? ?ASSESSMENT ?Patient presents to clinic with excellent motivation to participate in therapy. Patient continues to demonstrate deficits in spinal mobility, posture, postural strength/endurance, and body mechanics. Patient with concordant pain on deep palpation of obturator internus during today's session and responded positively with improved pain and mobility after manual interventions at the site. Patient will benefit from continued skilled therapeutic intervention to address remaining deficits in spinal mobility, posture, postural strength/endurance, and body mechanics in order to increase function and improve overall QOL. ? ? PT Long Term Goals - 08/10/21 1556   ? ?  ? PT LONG TERM GOAL #1  ? Title Patient will be independent with HEP in order to decrease back pain and increase strength in order to improve pain-free function at home and work.   ? Baseline IE: not initiated; 7/28: IND   ?  Time 8   ? Period Weeks   ? Status Achieved   ?  ? PT LONG TERM GOAL #2  ? Title Patient will demonstrate improved function as evidenced by a score of 59 on FOTO measure for full participation in activities at home and in the community.   ? Baseline IE: 50; 7/28: 57; 9/28: 56; 1/5: 61; 2/20: 46   ? Time 12   ? Period Weeks   ? Status On-going   ? Target Date 09/21/21   ?  ? PT LONG TERM GOAL #3  ? Title Patient will decrease worst pain as  reported on NPRS by at least 2 points to demonstrate clinically significant reduction in pain in order to restore/improve function and overall QOL.   ? Baseline IE: 9/10; 7/28: AM 9/10, PM 6/10; 9/28: AM 7/10, PM 5/10; 11/23: 7/10 AM, 6/10 PM; 1/5: 7/10 AM, 5/10 PM; 2/20: 7/10 AM, 9/10 PM   ? Time 6   ? Period Weeks   ? Status On-going   ? Target Date 09/21/21   ?  ? PT LONG TERM GOAL #4  ? Title Patient will be able to perform ADLs and community activities including laundry, bed making, grocery shopping, meal prep and walking the dog with pain controlled at or below 6/10 on NPRS for 24 hour period in order to improve function and QOL.   ? Baseline IE: 9/10 pain; 7/28: 7/10 pain; 9/28: 6-7/10 pain (changed linens, meal prep for mother, grocery, laundry); 11/23: 6-7/10 (changed linens, meal prep for mother, grocery, laundry); 1/5: 6/10 (laundry, grocery shopping); 2/20: 8-9/10 pain pakcin gup mother's home   ? Time 6   ? Period Weeks   ? Status On-going   ? Target Date 09/21/21   ? ?  ?  ? ?  ? ? ? ? ? ? ? ? Plan - 09/02/21 1556   ? ? Clinical Impression Statement Patient presents to clinic with excellent motivation to participate in therapy. Patient continues to demonstrate deficits in spinal mobility, posture, postural strength/endurance, and body mechanics. Patient with concordant pain on deep palpation of obturator internus during today's session and responded positively with improved pain and mobility after manual interventions at the site. Patient will benefit from continued skilled therapeutic intervention to address remaining deficits in spinal mobility, posture, postural strength/endurance, and body mechanics in order to increase function and improve overall QOL.   ? Personal Factors and Comorbidities Behavior Pattern;Comorbidity 3+;Past/Current Experience;Time since onset of injury/illness/exacerbation;Age   ? Comorbidities mixed hyperlipidemia, L4/5 spinal stenosis, osteopenia, h/o of THA L, colon polyp    ? Examination-Activity Limitations Transfers;Squat;Bend;Lift;Locomotion Level;Carry;Reach Overhead;Stand   ? Examination-Participation Restrictions Laundry;Yard Work;Meal Prep;Shop;Cleaning   ? Stability/Clinical Decision Making Evolving/Moderate complexity   ? Rehab Potential Fair   ? PT Frequency 1x / week   ? PT Duration 6 weeks   ? PT Treatment/Interventions ADLs/Self Care Home Management;Aquatic Therapy;Cryotherapy;Electrical Stimulation;Moist Heat;Therapeutic exercise;Neuromuscular re-education;Patient/family education;Manual techniques;Taping;Energy conservation;Scar mobilization;Orthotic Fit/Training   ? PT Next Visit Plan abdominal strengthening progression, manual, scar massage   ? PT Home Exercise Plan gentle lumbar stretches, mermaid stretch   ? Consulted and Agree with Plan of Care Patient   ? ?  ?  ? ?  ? ? ?Patient will benefit from skilled therapeutic intervention in order to improve the following deficits and impairments:  Abnormal gait, Decreased endurance, Decreased mobility, Difficulty walking, Improper body mechanics, Decreased range of motion, Decreased activity tolerance, Decreased strength, Postural dysfunction, Pain ? ?Visit Diagnosis: ?Abnormal posture ? ?  Chronic low back pain, unspecified back pain laterality, unspecified whether sciatica present ? ? ? ? ?Problem List ?Patient Active Problem List  ? Diagnosis Date Noted  ? Eczema 03/07/2019  ? 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 Primary osteoarthritis of left hip 07/12/2017  ? Osteopenia of neck of femur 03/24/2017  ? Hyperlipidemia, mixed 03/17/2017  ? Low back pain with sciatica 04/08/2015  ? Colon polyp 09/10/2013  ? ?Myles Gip PT, DPT [phone]  ?09/02/2021, 6:30 PM ? ?[place] ?[facility] ?[address] [place], [place], [address] ?Phone: [phone]   Fax:  [phone] ? ?Name: ETHELWYN GILBERTSON ?MRN: [id number] ?Date of Birth: 1945-07-11 ? ? ? ?

## 2021-09-09 ENCOUNTER — Encounter: Payer: Self-pay | Admitting: Physical Therapy

## 2021-09-09 ENCOUNTER — Ambulatory Visit: Payer: Medicare Other | Admitting: Physical Therapy

## 2021-09-09 ENCOUNTER — Other Ambulatory Visit: Payer: Self-pay

## 2021-09-09 DIAGNOSIS — G8929 Other chronic pain: Secondary | ICD-10-CM

## 2021-09-09 DIAGNOSIS — R293 Abnormal posture: Secondary | ICD-10-CM | POA: Diagnosis not present

## 2021-09-09 NOTE — Therapy (Signed)
[place] ?[facility] ?[address]. [place], [place], [address] ?Phone: [phone]   Fax:  [phone] ? ?Physical Therapy Treatment ?Physical Therapy Progress Note ? ? ?Dates of reporting period  01/29/2021   to   09/09/2021 ? ?Patient Details  ?Name: Lindsey Freeman ?MRN: [id number] ?Date of Birth: [birth year] ?Referring Provider (PT): Meeler, Whitney ? ? ?Encounter Date: 09/09/2021 ? ? PT End of Session - 09/09/21 1330   ? ? Visit Number 30   ? Number of Visits 36   ? Date for PT Re-Evaluation 09/21/21   ? Authorization Type PN 01/29/2021; PN 09/09/2021   ? Authorization Time Period IE 11/20/2020   ? PT Start Time 1330   ? PT Stop Time 1410   ? PT Time Calculation (min) 40 min   ? Activity Tolerance Patient tolerated treatment well   ? Behavior During Therapy [facility] for tasks assessed/performed   ? ?  ?  ? ?  ? ? ?Past Medical History:  ?Diagnosis Date  ? Back pain   ? ? ?Past Surgical History:  ?Procedure Laterality Date  ? ABDOMINAL HYSTERECTOMY    ? COLONOSCOPY WITH PROPOFOL N/A 03/03/2021  ? Procedure: COLONOSCOPY WITH PROPOFOL;  Surgeon: Lesly Rubenstein, MD;  Location: [facility] ENDOSCOPY;  Service: Endoscopy;  Laterality: N/A;  ? TOTAL HIP ARTHROPLASTY Left 03/07/2019  ? Procedure: TOTAL HIP ARTHROPLASTY;  Surgeon: Dereck Leep, MD;  Location: [facility] ORS;  Service: Orthopedics;  Laterality: Left;  ? ? ?There were no vitals filed for this visit. ? ? Subjective Assessment - 09/09/21 1739   ? ? Subjective Patient presents to clinic with [facility] and significant pain in B lumbar region. Patient notes that she had to complete more tasks in order to prepare her mother's home for market and then had her own chores and home to take care of. She has been having the most pain with STS transfers and feels a spasm in the low back but denies any symptoms radiating to or down the leg. Patient has had modest success alternating heat and ice for pain modulation, but is still in 8/10 pain intensity  currently.   ? ?  ?  ? ?  ? ?TREATMENT ? ?Manual Therapy: ? ?Neuromuscular Re-education: ?Reassessed goals; see below.  ?Patient education on gate control theory of pain and options for pain modulation including home TENS unit.  ?Supine hooklying diaphragmatic breathing with VCs and TCs for downregulation of the nervous system and improved management of IAP ?Patient education and practice of exhalation with transfers for decreased intra-abdominal pressure which may be contributing to pain experience. Practiced with bed mobility and STS. ? ? ?Treatments unbilled: MHP and TENS to lumbar region during neuromuscular re-education. No skin irritation noted upon removal. ? ?Patient educated throughout session on appropriate technique and form using multi-modal cueing, HEP, and activity modification. Patient articulated understanding and returned demonstration. ? ?Patient Response to interventions: ?6/10 at end of session ? ?ASSESSMENT ?Patient presents to clinic with excellent motivation to participate in therapy. Patient continues to demonstrate deficits in spinal mobility, posture, postural strength/endurance, and body mechanics. Patient had significant reduction in pain with applied body mechanics principles and TENS treatment to lumbar region during today's session and continues to indicate regressions in function directly correlated to temporarily increased physical demands at home. Patient's condition has the potential to improve in response to therapy. Maximum improvement is yet to be obtained. The anticipated improvement is attainable and reasonable in a generally predictable time.  Patient will benefit from continued skilled therapeutic intervention to address remaining deficits in spinal mobility, posture, postural strength/endurance, and body mechanics in order to increase function and improve overall QOL. ? ? PT Long Term Goals - 09/09/21 1330   ? ?  ? PT LONG TERM GOAL #1  ? Title Patient will be independent  with HEP in order to decrease back pain and increase strength in order to improve pain-free function at home and work.   ? Baseline IE: not initiated; 7/28: IND   ? Time 8   ? Period Weeks   ? Status Achieved   ?  ? PT LONG TERM GOAL #2  ? Title Patient will demonstrate improved function as evidenced by a score of 59 on FOTO measure for full participation in activities at home and in the community.   ? Baseline IE: 50; 7/28: 57; 9/28: 56; 1/5: 61; 2/20: 46; 3/22: 39   ? Time 12   ? Period Weeks   ? Status On-going   ? Target Date 09/21/21   ?  ? PT LONG TERM GOAL #3  ? Title Patient will decrease worst pain as reported on NPRS by at least 2 points to demonstrate clinically significant reduction in pain in order to restore/improve function and overall QOL.   ? Baseline IE: 9/10; 7/28: AM 9/10, PM 6/10; 9/28: AM 7/10, PM 5/10; 11/23: 7/10 AM, 6/10 PM; 1/5: 7/10 AM, 5/10 PM; 2/20: 7/10 AM, 9/10 PM; 3/22: 10/10 AM; 9/10 STS   ? Time 6   ? Period Weeks   ? Status On-going   ? Target Date 09/21/21   ?  ? PT LONG TERM GOAL #4  ? Title Patient will be able to perform ADLs and community activities including laundry, bed making, grocery shopping, meal prep and walking the dog with pain controlled at or below 6/10 on NPRS for 24 hour period in order to improve function and QOL.   ? Baseline IE: 9/10 pain; 7/28: 7/10 pain; 9/28: 6-7/10 pain (changed linens, meal prep for mother, grocery, laundry); 11/23: 6-7/10 (changed linens, meal prep for mother, grocery, laundry); 1/5: 6/10 (laundry, grocery shopping); 2/20: 8-9/10 pain pakcing up mother's home; 3/22: 9-10/10 pain packing up mother's home   ? Time 6   ? Period Weeks   ? Status On-going   ? Target Date 09/21/21   ? ?  ?  ? ?  ? ? ? ? ? ? ? ? Plan - 09/09/21 1741   ? ? Clinical Impression Statement Patient presents to clinic with excellent motivation to participate in therapy. Patient continues to demonstrate deficits in spinal mobility, posture, postural strength/endurance,  and body mechanics. Patient had significant reduction in pain with applied body mechanics principles and TENS treatment to lumbar region during today's session and continues to indicate regressions in function directly correlated to temporarily increased physical demands at home. Patient's condition has the potential to improve in response to therapy. Maximum improvement is yet to be obtained. The anticipated improvement is attainable and reasonable in a generally predictable time. Patient will benefit from continued skilled therapeutic intervention to address remaining deficits in spinal mobility, posture, postural strength/endurance, and body mechanics in order to increase function and improve overall QOL.   ? Personal Factors and Comorbidities Behavior Pattern;Comorbidity 3+;Past/Current Experience;Time since onset of injury/illness/exacerbation;Age   ? 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 Examination-Activity Limitations Transfers;Squat;Bend;Lift;Locomotion Level;Carry;Reach Overhead;Stand   ? Examination-Participation Restrictions Laundry;[employer]  Work;Meal Prep;Shop;Cleaning   ? Stability/Clinical Decision Making Evolving/Moderate complexity   ? Rehab Potential Fair   ? PT Frequency 1x / week   ? PT Duration 6 weeks   ? PT Treatment/Interventions ADLs/Self Care Home Management;Aquatic Therapy;Cryotherapy;Electrical Stimulation;Moist Heat;Therapeutic exercise;Neuromuscular re-education;Patient/family education;Manual techniques;Taping;Energy conservation;Scar mobilization;Orthotic Fit/Training   ? PT Next Visit Plan abdominal strengthening progression, manual, scar massage   ? PT Home Exercise Plan gentle lumbar stretches, mermaid stretch   ? Consulted and Agree with Plan of Care Patient   ? ?  ?  ? ?  ? ? ?Patient will benefit from skilled therapeutic intervention in order to improve the following deficits and impairments:  Abnormal gait, Decreased  endurance, Decreased mobility, Difficulty walking, Improper body mechanics, Decreased range of motion, Decreased activity tolerance, Decreased strength, Postural dysfunction, Pain ? ?Visit Diagnosis: ?Abnormal po

## 2021-09-16 ENCOUNTER — Ambulatory Visit: Payer: Medicare Other | Admitting: Physical Therapy

## 2021-09-16 ENCOUNTER — Encounter: Payer: Self-pay | Admitting: Physical Therapy

## 2021-09-16 DIAGNOSIS — G8929 Other chronic pain: Secondary | ICD-10-CM

## 2021-09-16 DIAGNOSIS — R293 Abnormal posture: Secondary | ICD-10-CM | POA: Diagnosis not present

## 2021-09-16 DIAGNOSIS — M545 Low back pain, unspecified: Secondary | ICD-10-CM

## 2021-09-16 NOTE — Therapy (Signed)
?OUTPATIENT PHYSICAL THERAPY TREATMENT NOTE ? ? ?Patient Name: Lindsey Freeman ?MRN: [id number] ?DOB:22-May-1946, 76 y.o., female ?Today's Date: 09/16/2021 ? ?PCP: [name], MD ?REFERRING PROVIDER: Meeler, Sherren Kerns, FNP ? ? PT End of Session - 09/16/21 1335   ? ? Visit Number 31   ? Number of Visits 36   ? Date for PT Re-Evaluation 09/21/21   ? Authorization Type PN 01/29/2021; PN 09/09/2021   ? Authorization Time Period IE 11/20/2020   ? PT Start Time 1330   ? PT Stop Time 1410   ? PT Time Calculation (min) 40 min   ? Activity Tolerance Patient tolerated treatment well   ? Behavior During Therapy [facility] for tasks assessed/performed   ? ?  ?  ? ?  ? ? ?Past Medical History:  ?Diagnosis Date  ? Back pain   ? ?Past Surgical History:  ?Procedure Laterality Date  ? ABDOMINAL HYSTERECTOMY    ? COLONOSCOPY WITH PROPOFOL N/A 03/03/2021  ? Procedure: COLONOSCOPY WITH PROPOFOL;  Surgeon: Lesly Rubenstein, MD;  Location: [facility] ENDOSCOPY;  Service: Endoscopy;  Laterality: N/A;  ? TOTAL HIP ARTHROPLASTY Left 03/07/2019  ? Procedure: TOTAL HIP ARTHROPLASTY;  Surgeon: Dereck Leep, MD;  Location: [facility] ORS;  Service: Orthopedics;  Laterality: Left;  ? ?Patient Active Problem List  ? Diagnosis Date Noted  ? Eczema 03/07/2019  ? 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 Primary osteoarthritis of left hip 07/12/2017  ? Osteopenia of neck of femur 03/24/2017  ? Hyperlipidemia, mixed 03/17/2017  ? Low back pain with sciatica 04/08/2015  ? Colon polyp 09/10/2013  ? ? 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 ?THERAPY DIAG:  ?Abnormal posture ? ?Chronic low back pain, unspecified back pain laterality, unspecified whether sciatica present ? ? ? ?PRECAUTIONS:  None ? ?SUBJECTIVE: Patient notes that she had recent bloodwork which showed no significant changes. Patient had good results with use of TENS on recent trip. She feels like she is able to stand more erect since using.  ? ?PAIN:  ?Are you having pain? Yes: NPRS scale: 6/10 ? ?TREATMENT ? ?Manual Therapy: ?STM and TPR performed to R lumbar paraspinals and gluteal mm to allow for decreased tension and pain and improved posture and function ?IC distraction with MFR of R thoracolumbar fascia ?Sidelying sacral mobilizations with and without movement for improved mobility and decreased pain ?Sidelying R LAD for improved joint mobility and decreased pain ? ?Neuromuscular Re-education: ? ? ? ? ? ?Patient educated throughout session on appropriate technique and form using multi-modal cueing, HEP, and activity modification. Patient articulated understanding and returned demonstration. ? ?Patient Response to interventions: ?5/10 at end of session ? ?ASSESSMENT ?Patient presents to clinic with excellent motivation to participate in therapy. Patient continues to demonstrate deficits in spinal mobility, posture, postural strength/endurance, and body mechanics. Patient with improved sacral mobility and posture during today's session and responded positively to manual interventions. Patient will benefit from continued skilled therapeutic intervention to address remaining deficits in spinal mobility, posture, postural strength/endurance, and body mechanics in order to increase function and improve overall QOL. ? ? PT Long Term Goals  ? ?  ? PT LONG TERM GOAL #1  ? Title Patient will be independent with HEP in order to decrease back pain and increase strength in order to improve pain-free  function at home and work.   ? Baseline IE: not initiated; 7/28: IND   ? Time 8   ? Period Weeks   ? Status Achieved   ?  ? PT LONG TERM GOAL #2  ? Title Patient will demonstrate improved function as evidenced by a score of 59 on FOTO measure for  full participation in activities at home and in the community.   ? Baseline IE: 50; 7/28: 57; 9/28: 56; 1/5: 61; 2/20: 46; 3/22: 39   ? Time 12   ? Period Weeks   ? Status On-going   ? Target Date 09/21/21   ?  ? PT LONG TERM GOAL #3  ? Title Patient will decrease worst pain as reported on NPRS by at least 2 points to demonstrate clinically significant reduction in pain in order to restore/improve function and overall QOL.   ? Baseline IE: 9/10; 7/28: AM 9/10, PM 6/10; 9/28: AM 7/10, PM 5/10; 11/23: 7/10 AM, 6/10 PM; 1/5: 7/10 AM, 5/10 PM; 2/20: 7/10 AM, 9/10 PM; 3/22: 10/10 AM; 9/10 STS   ? Time 6   ? Period Weeks   ? Status On-going   ? Target Date 09/21/21   ?  ? PT LONG TERM GOAL #4  ? Title Patient will be able to perform ADLs and community activities including laundry, bed making, grocery shopping, meal prep and walking the dog with pain controlled at or below 6/10 on NPRS for 24 hour period in order to improve function and QOL.   ? Baseline IE: 9/10 pain; 7/28: 7/10 pain; 9/28: 6-7/10 pain (changed linens, meal prep for mother, grocery, laundry); 11/23: 6-7/10 (changed linens, meal prep for mother, grocery, laundry); 1/5: 6/10 (laundry, grocery shopping); 2/20: 8-9/10 pain pakcing up mother's home; 3/22: 9-10/10 pain packing up mother's home   ? Time 6   ? Period Weeks   ? Status On-going   ? Target Date 09/21/21   ? ?  ?  ? ?  ?  ? Plan   ? ? Clinical Impression Statement Patient presents to clinic with excellent motivation to participate in therapy. Patient continues to demonstrate deficits in spinal mobility, posture, postural strength/endurance, and body mechanics. Patient with improved sacral mobility and posture during today's session and responded positively to manual interventions. Patient will benefit from continued skilled therapeutic intervention to address remaining deficits in spinal mobility, posture, postural strength/endurance, and body mechanics in order to increase function and improve  overall QOL.   ? Personal Factors and Comorbidities Behavior Pattern;Comorbidity 3+;Past/Current Experience;Time since onset of injury/illness/exacerbation;Age   ? 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 Examination-Activity Limitations Transfers;Squat;Bend;Lift;Locomotion Level;Carry;Reach Overhead;Stand   ? Examination-Participation Restrictions Laundry;Yard Work;Meal Prep;Shop;Cleaning   ? Stability/Clinical Decision Making Evolving/Moderate complexity   ? Rehab Potential Fair   ? PT Frequency 1x / week   ? PT Duration 6 weeks   ? PT Treatment/Interventions ADLs/Self Care Home Management;Aquatic Therapy;Cryotherapy;Electrical Stimulation;Moist Heat;Therapeutic exercise;Neuromuscular re-education;Patient/family education;Manual techniques;Taping;Energy conservation;Scar mobilization;Orthotic Fit/Training   ? PT Next Visit Plan abdominal strengthening progression, manual, scar massage   ? PT Home Exercise Plan gentle lumbar stretches, mermaid stretch   ? Consulted and Agree with Plan of Care Patient   ? ?  ?  ? ?  ?  ? ? Myles Gip PT, DPT [phone]  ?09/16/2021, 1:36 PM ? ?  ? ?

## 2021-09-23 ENCOUNTER — Encounter: Payer: Self-pay | Admitting: Physical Therapy

## 2021-09-23 ENCOUNTER — Ambulatory Visit: Payer: Medicare Other | Attending: Family Medicine | Admitting: Physical Therapy

## 2021-09-23 DIAGNOSIS — M545 Low back pain, unspecified: Secondary | ICD-10-CM | POA: Insufficient documentation

## 2021-09-23 DIAGNOSIS — R293 Abnormal posture: Secondary | ICD-10-CM | POA: Diagnosis present

## 2021-09-23 DIAGNOSIS — G8929 Other chronic pain: Secondary | ICD-10-CM | POA: Diagnosis present

## 2021-09-23 NOTE — Therapy (Signed)
?OUTPATIENT PHYSICAL THERAPY TREATMENT NOTE ? ? ?Patient Name: Lindsey Freeman ?MRN: [id number] ?DOB:02-01-1946, 76 y.o., female ?Today's Date: 09/23/2021 ? ?PCP: [name], MD ?REFERRING PROVIDER: Meeler, Sherren Kerns, FNP ? ? PT End of Session - 09/23/21 1201   ? ? Visit Number 32   ? Number of Visits 42   ? Date for PT Re-Evaluation 11/04/21   ? Authorization Type PN 01/29/2021; PN 09/09/2021   ? Authorization Time Period IE 11/20/2020   ? PT Start Time 1200   ? PT Stop Time 1240   ? PT Time Calculation (min) 40 min   ? Activity Tolerance Patient tolerated treatment well   ? Behavior During Therapy [facility] for tasks assessed/performed   ? ?  ?  ? ?  ? ? ?Past Medical History:  ?Diagnosis Date  ? Back pain   ? ?Past Surgical History:  ?Procedure Laterality Date  ? ABDOMINAL HYSTERECTOMY    ? COLONOSCOPY WITH PROPOFOL N/A 03/03/2021  ? Procedure: COLONOSCOPY WITH PROPOFOL;  Surgeon: Lesly Rubenstein, MD;  Location: [facility] ENDOSCOPY;  Service: Endoscopy;  Laterality: N/A;  ? TOTAL HIP ARTHROPLASTY Left 03/07/2019  ? Procedure: TOTAL HIP ARTHROPLASTY;  Surgeon: Dereck Leep, MD;  Location: [facility] ORS;  Service: Orthopedics;  Laterality: Left;  ? ?Patient Active Problem List  ? Diagnosis Date Noted  ? Eczema 03/07/2019  ? 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 Primary osteoarthritis of left hip 07/12/2017  ? Osteopenia of neck of femur 03/24/2017  ? Hyperlipidemia, mixed 03/17/2017  ? Low back pain with sciatica 04/08/2015  ? Colon polyp 09/10/2013  ? ? 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 ?THERAPY DIAG:  ?Abnormal posture ? ?Chronic low back pain, unspecified back pain laterality, unspecified whether sciatica present ? ? ? ?PRECAUTIONS:  None ? ?SUBJECTIVE: Patient reports that since putting mother's house on the market she has been feeling so much better in her back. Patient was able to clean up garage with pain controlled. Patient reports that she has been using TENS unit to interrupt pain. Patient dusted this morning and notes pain is well controlled at 6/10.  ?PAIN:  ?Are you having pain? Yes: NPRS scale: 6/10 ? ? Plan - 09/23/21 1406   ? ? Personal Factors and Comorbidities Behavior Pattern;Comorbidity 3+;Past/Current Experience;Time since onset of injury/illness/exacerbation;Age   ? 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 Examination-Activity Limitations Transfers;Squat;Bend;Lift;Locomotion Level;Carry;Reach Overhead;Stand   ? Examination-Participation Restrictions Laundry;Yard Work;Meal Prep;Shop;Cleaning   ? Stability/Clinical Decision Making Evolving/Moderate complexity   ? Rehab Potential Fair   ? PT Frequency 1x / week   ? PT Duration 6 weeks   ? PT Treatment/Interventions ADLs/Self Care Home Management;Aquatic Therapy;Cryotherapy;Electrical Stimulation;Moist Heat;Therapeutic exercise;Neuromuscular re-education;Patient/family education;Manual techniques;Taping;Energy conservation;Scar mobilization;Orthotic Fit/Training   ? PT Next Visit Plan abdominal strengthening progression   ? PT Home Exercise Plan gentle lumbar stretches, mermaid stretch   ? Consulted and Agree with Plan of Care Patient   ? ?  ?  ? ?  ?TREATMENT ? ?Manual Therapy: ?STM and TPR performed to R lumbar paraspinals and gluteal mm to allow for decreased tension and pain and improved posture and function ?IC distraction with MFR of R thoracolumbar fascia ?Sidelying sacral mobilizations with and without movement for improved mobility and decreased pain ?  Sidelying R LAD for improved joint mobility and decreased pain ? ?Neuromuscular Re-education: ? ? ? ? ? ?Patient educated throughout session on appropriate technique  and form using multi-modal cueing, HEP, and activity modification. Patient articulated understanding and returned demonstration. ? ?Patient Response to interventions: ?5/10 at end of session ? ?ASSESSMENT ?Patient presents to clinic with excellent motivation to participate in therapy. Patient continues to demonstrate deficits in spinal mobility, posture, postural strength/endurance, and body mechanics. Patient with improved sacral mobility and posture during today's session and responded positively to manual interventions. Patient will benefit from continued skilled therapeutic intervention to address remaining deficits in spinal mobility, posture, postural strength/endurance, and body mechanics in order to increase function and improve overall QOL. ? ? PT Long Term Goals  ? ?  ? PT LONG TERM GOAL #1  ? Title Patient will be independent with HEP in order to decrease back pain and increase strength in order to improve pain-free function at home and work.   ? Baseline IE: not initiated; 7/28: IND   ? Time 8   ? Period Weeks   ? Status Achieved   ?  ? PT LONG TERM GOAL #2  ? Title Patient will demonstrate improved function as evidenced by a score of 59 on FOTO measure for full participation in activities at home and in the community.   ? Baseline IE: 50; 7/28: 57; 9/28: 56; 1/5: 61; 2/20: 46; 3/22: 39; 4/5: 63  ? Time 12   ? Period Weeks   ? Status Achieved  ? Target Date 09/21/21   ?  ? PT LONG TERM GOAL #3  ? Title Patient will decrease worst pain as reported on NPRS by at least 2 points to demonstrate clinically significant reduction in pain in order to restore/improve function and overall QOL.   ? Baseline IE: 9/10; 7/28: AM 9/10, PM 6/10; 9/28: AM 7/10, PM 5/10; 11/23: 7/10 AM, 6/10 PM; 1/5: 7/10 AM, 5/10 PM; 2/20: 7/10 AM, 9/10 PM; 3/22: 10/10 AM; 9/10 STS; 4/5: 7/10 AM, 6-7/10 PM  ? Time 6   ? Period Weeks   ? Status On-going   ? Target Date 11/04/2021   ?  ? PT LONG TERM GOAL #4  ? Title Patient will be able to  perform ADLs and community activities including laundry, bed making, grocery shopping, meal prep and walking the dog with pain controlled at or below 6/10 on NPRS for 24 hour period in order to improve function and QOL.   ? Baseline IE: 9/10 pain; 7/28: 7/10 pain; 9/28: 6-7/10 pain (changed linens, meal prep for mother, grocery, laundry); 11/23: 6-7/10 (changed linens, meal prep for mother, grocery, laundry); 1/5: 6/10 (laundry, grocery shopping); 2/20: 8-9/10 pain pakcing up mother's home; 3/22: 9-10/10 pain packing up mother's home; 4/5: 8/10 garage cleaning (2 hours)  ? Time 6   ? Period Weeks   ? Status On-going   ? Target Date 11/04/2021  ? ?  ?  ? ?  ?  ? Plan   ? ? Clinical Impression Statement Patient presents to clinic with excellent motivation to participate in therapy. Since completion of tasks surrounding sale of mother's home, patient's pain has become much more controlled. Patient indicating appropriate application of modulating tools including massage, TENS, stretches, and grading activity. Patient does continue to demonstrate modest deficits in spinal mobility, posture, postural strength/endurance, and body mechanics. Patient able to perform postural interventions and core stabilization interventions without aggravation of pain during today's session and has responded  positively to active, manual, and educational interventions throughout her episode of care. Patient will benefit from continued skilled therapeutic intervention to address remaining deficits in spinal mobility, posture, postural strength/endurance, and body mechanics prior to discharging to self-management for maintenance of improved function and improved overall QOL.   ? Personal Factors and Comorbidities Behavior Pattern;Comorbidity 3+;Past/Current Experience;Time since onset of injury/illness/exacerbation;Age   ? 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 Examination-Activity  Limitations Transfers;Squat;Bend;Lift;Locomotion Level;Carry;Reach Overhead;Stand   ? Examination-Participation Restrictions Laundry;Yard Work;Meal Prep;Shop;Cleaning   ? Stability/Clinical Decision Making Evolving/Mod

## 2021-10-07 ENCOUNTER — Ambulatory Visit: Payer: Medicare Other | Admitting: Physical Therapy

## 2021-10-07 ENCOUNTER — Encounter: Payer: Self-pay | Admitting: Physical Therapy

## 2021-10-07 DIAGNOSIS — R293 Abnormal posture: Secondary | ICD-10-CM | POA: Diagnosis not present

## 2021-10-07 DIAGNOSIS — M545 Low back pain, unspecified: Secondary | ICD-10-CM

## 2021-10-07 NOTE — Therapy (Signed)
?OUTPATIENT PHYSICAL THERAPY TREATMENT NOTE ? ? ?Patient Name: Lindsey Freeman ?MRN: [id number] ?DOB:10-11-1945, 76 y.o., female ?Today's Date: 10/07/2021 ? ?PCP: [name], MD ?REFERRING PROVIDER: Meeler, Sherren Kerns, FNP ? ? PT End of Session - 10/07/21 1335   ? ? Visit Number [id number]   ? Number of Visits 42   ? Date for PT Re-Evaluation 11/04/21   ? Authorization Type PN 01/29/2021; PN 09/09/2021   ? Authorization Time Period IE 11/20/2020   ? PT Start Time 1330   ? PT Stop Time 1410   ? PT Time Calculation (min) 40 min   ? Activity Tolerance Patient tolerated treatment well   ? Behavior During Therapy [facility] for tasks assessed/performed   ? ?  ?  ? ?  ? ? ?Past Medical History:  ?Diagnosis Date  ? Back pain   ? ?Past Surgical History:  ?Procedure Laterality Date  ? ABDOMINAL HYSTERECTOMY    ? COLONOSCOPY WITH PROPOFOL N/A 03/03/2021  ? Procedure: COLONOSCOPY WITH PROPOFOL;  Surgeon: Lesly Rubenstein, MD;  Location: [facility] ENDOSCOPY;  Service: Endoscopy;  Laterality: N/A;  ? TOTAL HIP ARTHROPLASTY Left 03/07/2019  ? Procedure: TOTAL HIP ARTHROPLASTY;  Surgeon: Dereck Leep, MD;  Location: [facility] ORS;  Service: Orthopedics;  Laterality: Left;  ? ?Patient Active Problem List  ? Diagnosis Date Noted  ? Eczema 03/07/2019  ? 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 Primary osteoarthritis of left hip 07/12/2017  ? Osteopenia of neck of femur 03/24/2017  ? Hyperlipidemia, mixed 03/17/2017  ? Low back pain with sciatica 04/08/2015  ? Colon polyp 09/10/2013  ? ? 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 ?THERAPY DIAG:  ?Abnormal posture ? ?Chronic low back pain, unspecified back pain laterality, unspecified whether sciatica present ? ? ? ?PRECAUTIONS:  None ? ?SUBJECTIVE: Patient states that she has been doing well until yesterday when stress increased regarding selling her mother's home. She has been diligent with home exercises and pain modulation strategies and feel they are all continuing to help with symptom management.  ?PAIN:  ?Are you having pain? Yes: NPRS scale: 6/10 ? ? ?TREATMENT ? ?Manual Therapy: ?STM and TPR performed to R lumbar paraspinals and gluteal mm to allow for decreased tension and pain and improved posture and function ?IC distraction with MFR of R thoracolumbar fascia ?Sidelying sacral mobilizations with and without movement for improved mobility and decreased pain ?Sidelying R LAD for improved joint mobility and decreased pain ? ?Neuromuscular Re-education: ? ? ? ? ? ?Patient educated throughout session on appropriate technique and form using multi-modal cueing, HEP, and activity modification. Patient articulated understanding and returned demonstration. ? ?Patient Response to interventions: ?0/10 at end of session ? ? ? PT Long Term Goals  ? ?  ? PT LONG TERM GOAL #1  ? Title Patient will be independent with HEP in order to decrease back pain and increase strength in order to improve pain-free function at home and work.   ? Baseline IE: not initiated; 7/28: IND   ? Time 8   ? Period Weeks   ? Status Achieved   ?  ? PT LONG TERM GOAL #2  ? Title Patient will demonstrate improved function as evidenced by a score of 59 on FOTO measure for full participation in activities at home and in the  community.   ? Baseline IE: 50; 7/28: 57; 9/28: 56; 1/5: 61; 2/20: 46; 3/22: 39; 4/5: 63  ? Time 12   ? Period Weeks   ? Status Achieved  ? Target Date 09/21/21   ?  ? PT LONG TERM GOAL #3  ? Title Patient will decrease worst pain as reported on NPRS by at least 2 points to demonstrate clinically significant reduction in pain in order to restore/improve function and overall QOL.   ? Baseline IE: 9/10; 7/28: AM 9/10, PM 6/10; 9/28: AM 7/10, PM 5/10;  11/23: 7/10 AM, 6/10 PM; 1/5: 7/10 AM, 5/10 PM; 2/20: 7/10 AM, 9/10 PM; 3/22: 10/10 AM; 9/10 STS; 4/5: 7/10 AM, 6-7/10 PM  ? Time 6   ? Period Weeks   ? Status On-going   ? Target Date 11/04/2021   ?  ? PT LONG TERM GOAL #4  ? Title Patient will be able to perform ADLs and community activities including laundry, bed making, grocery shopping, meal prep and walking the dog with pain controlled at or below 6/10 on NPRS for 24 hour period in order to improve function and QOL.   ? Baseline IE: 9/10 pain; 7/28: 7/10 pain; 9/28: 6-7/10 pain (changed linens, meal prep for mother, grocery, laundry); 11/23: 6-7/10 (changed linens, meal prep for mother, grocery, laundry); 1/5: 6/10 (laundry, grocery shopping); 2/20: 8-9/10 pain pakcing up mother's home; 3/22: 9-10/10 pain packing up mother's home; 4/5: 8/10 garage cleaning (2 hours)  ? Time 6   ? Period Weeks   ? Status On-going   ? Target Date 11/04/2021  ? ?  ?  ? ?  ?  ? Plan   ? ? Clinical Impression Statement Patient presents to clinic with excellent motivation to participate in therapy. Patient had complete resolution of pain with manual interventions during today's session and continues to implement self-management strategies well between visits. Patient will benefit from continued skilled therapeutic intervention to address remaining deficits in spinal mobility, posture, postural strength/endurance, and body mechanics prior to discharging to self-management for maintenance of improved function and improved overall QOL.   ? Personal Factors and Comorbidities Behavior Pattern;Comorbidity 3+;Past/Current Experience;Time since onset of injury/illness/exacerbation;Age   ? 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 Examination-Activity Limitations Transfers;Squat;Bend;Lift;Locomotion Level;Carry;Reach Overhead;Stand   ? Examination-Participation Restrictions Laundry;Yard Work;Meal Prep;Shop;Cleaning   ?  Stability/Clinical Decision Making Evolving/Moderate complexity   ? Rehab Potential Fair   ? PT Frequency 1x / week   ? PT Duration 6 weeks   ? PT Treatment/Interventions ADLs/Self Care Home Management;Aquatic Therapy;Cryotherapy;Electrical Stimulation;Moist Heat;Therapeutic exercise;Neuromuscular re-education;Patient/family education;Manual techniques;Taping;Energy conservation;Scar mobilization;Orthotic Fit/Training   ? PT Next Visit Plan abdominal strengthening progression, manual, scar massage   ? PT Home Exercise Plan gentle lumbar stretches, mermaid stretch   ? Consulted and Agree with Plan of Care Patient   ? ?  ?  ? ?  ?  ? ? ?Myles Gip PT, DPT [phone]  ?10/07/2021, 1:36 PM ? ?  ? ?

## 2021-10-26 ENCOUNTER — Ambulatory Visit: Payer: Medicare Other | Admitting: Physical Therapy

## 2021-11-25 ENCOUNTER — Encounter: Payer: Medicare Other | Admitting: Physical Therapy

## 2021-12-02 ENCOUNTER — Ambulatory Visit: Payer: Medicare Other | Admitting: Physical Therapy

## 2021-12-02 ENCOUNTER — Encounter: Payer: Self-pay | Admitting: Physical Therapy

## 2021-12-02 DIAGNOSIS — R293 Abnormal posture: Secondary | ICD-10-CM | POA: Insufficient documentation

## 2021-12-02 DIAGNOSIS — G8929 Other chronic pain: Secondary | ICD-10-CM | POA: Insufficient documentation

## 2021-12-02 DIAGNOSIS — M545 Low back pain, unspecified: Secondary | ICD-10-CM | POA: Insufficient documentation

## 2021-12-03 NOTE — Therapy (Signed)
Encounter created in error. Visit cancelled on patient arrival.  Myles Gip PT, DPT [phone]

## 2021-12-15 ENCOUNTER — Encounter: Payer: Self-pay | Admitting: Physical Therapy

## 2021-12-15 ENCOUNTER — Ambulatory Visit: Payer: Medicare Other | Attending: Family Medicine | Admitting: Physical Therapy

## 2021-12-15 DIAGNOSIS — R293 Abnormal posture: Secondary | ICD-10-CM | POA: Diagnosis present

## 2021-12-15 DIAGNOSIS — M545 Low back pain, unspecified: Secondary | ICD-10-CM

## 2021-12-15 DIAGNOSIS — G8929 Other chronic pain: Secondary | ICD-10-CM | POA: Diagnosis present

## 2022-01-14 ENCOUNTER — Encounter: Payer: Self-pay | Admitting: Physical Therapy

## 2022-01-14 ENCOUNTER — Ambulatory Visit: Payer: Medicare Other | Attending: Family Medicine | Admitting: Physical Therapy

## 2022-01-14 DIAGNOSIS — R293 Abnormal posture: Secondary | ICD-10-CM | POA: Diagnosis not present

## 2022-01-14 DIAGNOSIS — M545 Low back pain, unspecified: Secondary | ICD-10-CM | POA: Diagnosis present

## 2022-01-14 DIAGNOSIS — G8929 Other chronic pain: Secondary | ICD-10-CM | POA: Diagnosis present

## 2022-01-14 NOTE — Therapy (Signed)
OUTPATIENT PHYSICAL THERAPY TREATMENT NOTE   Patient Name: Lindsey Freeman MRN: [id number] DOB:1946-04-04, 76 y.o., female Today's Date: 01/14/2022  PCP: [name], MD REFERRING PROVIDER: Meeler, Sherren Kerns, FNP   PT End of Session - 01/14/22 1119     Visit Number 35    Number of Visits 84    Date for PT Re-Evaluation 03/09/22    Authorization Type PN 01/29/2021; PN 09/09/2021    Authorization Time Period IE 11/20/2020    PT Start Time 1115    PT Stop Time 1155    PT Time Calculation (min) 40 min    Activity Tolerance Patient tolerated treatment well    Behavior During Therapy [facility] for tasks assessed/performed             Past Medical History:  Diagnosis Date   Back pain    Past Surgical History:  Procedure Laterality Date   ABDOMINAL HYSTERECTOMY     COLONOSCOPY WITH PROPOFOL N/A 03/03/2021   Procedure: COLONOSCOPY WITH PROPOFOL;  Surgeon: Lesly Rubenstein, MD;  Location: [facility] ENDOSCOPY;  Service: Endoscopy;  Laterality: N/A;   TOTAL HIP ARTHROPLASTY Left 03/07/2019   Procedure: TOTAL HIP ARTHROPLASTY;  Surgeon: Dereck Leep, MD;  Location: [facility] ORS;  Service: Orthopedics;  Laterality: Left;   Patient Active Problem List   Diagnosis Date Noted   Eczema 03/07/2019   H/O total hip arthroplasty 03/07/2019   Primary osteoarthritis of left hip 07/12/2017   Osteopenia of neck of femur 03/24/2017   Hyperlipidemia, mixed 03/17/2017   Low back pain with sciatica 04/08/2015   Colon polyp 09/10/2013    REFERRING DIAG: M48.062 (ICD-10-CM) - Spinal stenosis, lumbar region with neurogenic claudication M51.36 (ICD-10-CM) - Other intervertebral disc degeneration, lumbar region M54.16 (ICD-10-CM) - Radiculopathy, lumbar region M47.816 (ICD-10-CM) - Spondylosis without myelopathy or radiculopathy, lumbar region   THERAPY DIAG:  Abnormal posture  Chronic low back pain, unspecified back pain laterality, unspecified whether sciatica present    PRECAUTIONS:  None  SUBJECTIVE: Patient reports that she continues to struggle with finding balance of activity and rest in pain management.  PAIN:  Are you having pain? Yes: NPRS scale: 6/10   TREATMENT  Manual Therapy: STM and TPR performed to B lumbar paraspinals and gluteal mm to allow for decreased tension and pain and improved posture and function Sacral mobilizations, B, with and without hip extension and hip IR movement for improved mobility and decreased pain CPA/UPA B, lumbar region, grade II/III, 15 second bouts each level for improved mobility and pain  Neuromuscular Re-education: Reviewed HEP including:  Hip flexor stretch in seated and standing Mermaid stretch Open book Hooklying trunk rotations     Patient educated throughout session on appropriate technique and form using multi-modal cueing, HEP, and activity modification. Patient articulated understanding and returned demonstration.  Patient Response to interventions: Notes improved posture    PT Long Term Goals      PT LONG TERM GOAL #1   Title Patient will be independent with HEP in order to decrease back pain and increase strength in order to improve pain-free function at home and work.    Baseline IE: not initiated; 7/28: IND    Time 8    Period Weeks    Status Achieved      PT LONG TERM GOAL #2   Title Patient will demonstrate improved function as evidenced by a score of 59 on FOTO measure for full participation in activities at home and in the community.    Baseline  IE: 80; 7/28: 57; 9/28: 56; 1/5: 61; 2/20: 46; 3/22: 39; 4/5: 63; 6/27: 55   Time 12    Period Weeks    Status On-going   Target Date 03/09/2022      PT LONG TERM GOAL #3   Title Patient will decrease worst pain as reported on NPRS by at least 2 points to demonstrate clinically significant reduction in pain in order to restore/improve function and overall QOL.    Baseline IE: 9/10; 7/28: AM 9/10, PM 6/10; 9/28: AM 7/10, PM 5/10; 11/23: 7/10 AM,  6/10 PM; 1/5: 7/10 AM, 5/10 PM; 2/20: 7/10 AM, 9/10 PM; 3/22: 10/10 AM; 9/10 STS; 4/5: 7/10 AM, 6-7/10 PM; 6/27: 5/10 AM, 6/10 PM prior to weekend of childcare   Time 6    Period Weeks    Status On-going    Target Date 03/09/2022      PT LONG TERM GOAL #4   Title Patient will be able to perform ADLs and community activities including laundry, bed making, grocery shopping, meal prep and walking the dog with pain controlled at or below 6/10 on NPRS for 24 hour period in order to improve function and QOL.    Baseline IE: 9/10 pain; 7/28: 7/10 pain; 9/28: 6-7/10 pain (changed linens, meal prep for mother, grocery, laundry); 11/23: 6-7/10 (changed linens, meal prep for mother, grocery, laundry); 1/5: 6/10 (laundry, grocery shopping); 2/20: 8-9/10 pain pakcing up mother's home; 3/22: 9-10/10 pain packing up mother's home; 4/5: 8/10 garage cleaning (2 hours); 6/27: 8/10 pain diffuse lumbar pain after 4 days of childcare   Time 6    Period Weeks    Status On-going    Target Date 03/09/2022             Plan     Clinical Impression Statement Patient presents to clinic with excellent motivation to participate in therapy. Patient able to stand more erect after manual interventions during today's session and responded well to review of HEP interventions for self-management. Patient will benefit from continued skilled therapeutic intervention to address remaining deficits in spinal mobility, posture, postural strength/endurance, and body mechanics prior to discharging to self-management for maintenance of improved function and improved overall QOL.    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 stenosis, osteopenia, h/o of THA L, colon polyp    Examination-Activity Limitations Transfers;Squat;Bend;Lift;Locomotion Level;Carry;Reach Overhead;Stand     Examination-Participation Restrictions Laundry;Yard Work;Meal Prep;Shop;Cleaning    Stability/Clinical Decision Making Evolving/Moderate complexity    Rehab Potential Fair    PT Frequency 1x / week    PT Duration 12 weeks    PT Treatment/Interventions ADLs/Self Care Home Management;Aquatic Therapy;Cryotherapy;Electrical Stimulation;Moist Heat;Therapeutic exercise;Neuromuscular re-education;Patient/family education;Manual techniques;Taping;Energy conservation;Scar mobilization;Orthotic Fit/Training    PT Next Visit Plan abdominal strengthening progression, manual, scar massage    PT Home Exercise Plan gentle lumbar stretches, mermaid stretch    Consulted and Agree with Plan of Care Patient               Myles Gip PT, DPT [phone]  01/14/2022, 11:20 AM

## 2022-02-02 ENCOUNTER — Encounter: Payer: Self-pay | Admitting: Physical Therapy

## 2022-02-02 ENCOUNTER — Ambulatory Visit: Payer: Medicare Other | Attending: Family Medicine | Admitting: Physical Therapy

## 2022-02-02 DIAGNOSIS — G8929 Other chronic pain: Secondary | ICD-10-CM | POA: Insufficient documentation

## 2022-02-02 DIAGNOSIS — M545 Low back pain, unspecified: Secondary | ICD-10-CM | POA: Insufficient documentation

## 2022-02-02 DIAGNOSIS — R293 Abnormal posture: Secondary | ICD-10-CM | POA: Insufficient documentation

## 2022-02-02 NOTE — Therapy (Signed)
OUTPATIENT PHYSICAL THERAPY TREATMENT NOTE   Patient Name: Lindsey Freeman MRN: [id number] DOB:1945/10/28, 76 y.o., female Today's Date: 02/02/2022  PCP: [name], MD REFERRING PROVIDER: Meeler, Sherren Kerns, FNP   PT End of Session - 02/02/22 1347     Visit Number 36    Number of Visits 67    Date for PT Re-Evaluation 03/09/22    Authorization Type PN 01/29/2021; PN 09/09/2021    Authorization Time Period IE 11/20/2020    PT Start Time 1345    PT Stop Time 1425    PT Time Calculation (min) 40 min    Activity Tolerance Patient tolerated treatment well    Behavior During Therapy [facility] for tasks assessed/performed             Past Medical History:  Diagnosis Date   Back pain    Past Surgical History:  Procedure Laterality Date   ABDOMINAL HYSTERECTOMY     COLONOSCOPY WITH PROPOFOL N/A 03/03/2021   Procedure: COLONOSCOPY WITH PROPOFOL;  Surgeon: Lesly Rubenstein, MD;  Location: [facility] ENDOSCOPY;  Service: Endoscopy;  Laterality: N/A;   TOTAL HIP ARTHROPLASTY Left 03/07/2019   Procedure: TOTAL HIP ARTHROPLASTY;  Surgeon: Dereck Leep, MD;  Location: [facility] ORS;  Service: Orthopedics;  Laterality: Left;   Patient Active Problem List   Diagnosis Date Noted   Eczema 03/07/2019   H/O total hip arthroplasty 03/07/2019   Primary osteoarthritis of left hip 07/12/2017   Osteopenia of neck of femur 03/24/2017   Hyperlipidemia, mixed 03/17/2017   Low back pain with sciatica 04/08/2015   Colon polyp 09/10/2013    REFERRING DIAG: M48.062 (ICD-10-CM) - Spinal stenosis, lumbar region with neurogenic claudication M51.36 (ICD-10-CM) - Other intervertebral disc degeneration, lumbar region M54.16 (ICD-10-CM) - Radiculopathy, lumbar region M47.816 (ICD-10-CM) - Spondylosis without myelopathy or radiculopathy, lumbar region   THERAPY DIAG:  Abnormal posture  Chronic low back pain, unspecified back pain laterality, unspecified whether sciatica present    PRECAUTIONS:  None  SUBJECTIVE: Patient states that she has had back pain over the past week but no clear injury. Patient clarifies that pain is B and lower lumbar. Patient will be consulting with acupuncturist later this week. Is slated to travel and help with grandchildren next week which may result in overuse.   PAIN:  Are you having pain? Yes: NPRS scale: 7/10   TREATMENT  Manual Therapy: STM and TPR performed to B lumbar paraspinals and gluteal mm to allow for decreased tension and pain and improved posture and function Sacral mobilizations, B, with and without hip extension and hip IR movement for improved mobility and decreased pain CPA/UPA B, lumbar region, grade II/III, 15 second bouts each level for improved mobility and pain  Neuromuscular Re-education: Discussed graded activity/activity chunking strategies for decreased overuse:  Choosing 2 activities which require different postures (standing, sitting, kneeling, bending, etc.) Setting a timer for 30-40 minutes of activity and then taking a break for stretches, pain modulating interventions, and rest.      Patient educated throughout session on appropriate technique and form using multi-modal cueing, HEP, and activity modification. Patient articulated understanding and returned demonstration.  Patient Response to interventions: Notes improved mobility    PT Long Term Goals      PT LONG TERM GOAL #1   Title Patient will be independent with HEP in order to decrease back pain and increase strength in order to improve pain-free function at home and work.    Baseline IE: not initiated; 7/28: IND  Time 8    Period Weeks    Status Achieved      PT LONG TERM GOAL #2   Title Patient will demonstrate improved function as evidenced by a score of 59 on FOTO measure for full participation in activities at home and in the community.    Baseline IE: 50; 7/28: 57; 9/28: 56; 1/5: 61; 2/20: 46; 3/22: 39; 4/5: 63; 6/27: 55   Time 12    Period  Weeks    Status On-going   Target Date 03/09/2022      PT LONG TERM GOAL #3   Title Patient will decrease worst pain as reported on NPRS by at least 2 points to demonstrate clinically significant reduction in pain in order to restore/improve function and overall QOL.    Baseline IE: 9/10; 7/28: AM 9/10, PM 6/10; 9/28: AM 7/10, PM 5/10; 11/23: 7/10 AM, 6/10 PM; 1/5: 7/10 AM, 5/10 PM; 2/20: 7/10 AM, 9/10 PM; 3/22: 10/10 AM; 9/10 STS; 4/5: 7/10 AM, 6-7/10 PM; 6/27: 5/10 AM, 6/10 PM prior to weekend of childcare   Time 6    Period Weeks    Status On-going    Target Date 03/09/2022      PT LONG TERM GOAL #4   Title Patient will be able to perform ADLs and community activities including laundry, bed making, grocery shopping, meal prep and walking the dog with pain controlled at or below 6/10 on NPRS for 24 hour period in order to improve function and QOL.    Baseline IE: 9/10 pain; 7/28: 7/10 pain; 9/28: 6-7/10 pain (changed linens, meal prep for mother, grocery, laundry); 11/23: 6-7/10 (changed linens, meal prep for mother, grocery, laundry); 1/5: 6/10 (laundry, grocery shopping); 2/20: 8-9/10 pain pakcing up mother's home; 3/22: 9-10/10 pain packing up mother's home; 4/5: 8/10 garage cleaning (2 hours); 6/27: 8/10 pain diffuse lumbar pain after 4 days of childcare   Time 6    Period Weeks    Status On-going    Target Date 03/09/2022             Plan     Clinical Impression Statement Patient presents to clinic with excellent motivation to participate in therapy. Patient with improved spinal mobility after manual interventions during today's session and responded well to educational interventions. Patient will benefit from continued skilled therapeutic intervention to address remaining deficits in spinal mobility, posture, postural strength/endurance, and body mechanics prior to discharging to self-management for maintenance of improved function and improved overall QOL.    Personal Factors and  Comorbidities Behavior Pattern;Comorbidity 3+;Past/Current Experience;Time since onset of injury/illness/exacerbation;Age    Comorbidities mixed hyperlipidemia, L4/5 spinal stenosis, osteopenia, h/o of THA L, colon polyp    Examination-Activity Limitations Transfers;Squat;Bend;Lift;Locomotion Level;Carry;Reach Overhead;Stand    Examination-Participation Restrictions Laundry;Yard Work;Meal Prep;Shop;Cleaning    Stability/Clinical Decision Making Evolving/Moderate complexity    Rehab Potential Fair    PT Frequency 1x / week    PT Duration 12 weeks    PT Treatment/Interventions ADLs/Self Care Home Management;Aquatic Therapy;Cryotherapy;Electrical Stimulation;Moist Heat;Therapeutic exercise;Neuromuscular re-education;Patient/family education;Manual techniques;Taping;Energy conservation;Scar mobilization;Orthotic Fit/Training    PT Next Visit Plan abdominal strengthening progression, manual, scar massage    PT Home Exercise Plan gentle lumbar stretches, mermaid stretch    Consulted and Agree with Plan of Care Patient               Myles Gip PT, DPT [phone]  02/02/2022, 1:47 PM

## 2022-02-15 ENCOUNTER — Encounter: Payer: Self-pay | Admitting: Physical Therapy

## 2022-02-15 ENCOUNTER — Ambulatory Visit: Payer: Medicare Other | Admitting: Physical Therapy

## 2022-02-15 DIAGNOSIS — G8929 Other chronic pain: Secondary | ICD-10-CM

## 2022-02-15 DIAGNOSIS — R293 Abnormal posture: Secondary | ICD-10-CM | POA: Diagnosis not present

## 2022-02-15 NOTE — Therapy (Signed)
OUTPATIENT PHYSICAL THERAPY TREATMENT NOTE   Patient Name: Lindsey Freeman MRN: [id number] DOB:[birth year], 76 y.o., female Today's Date: 02/15/2022  PCP: [name], MD REFERRING PROVIDER: Meeler, Sherren Kerns, FNP   PT End of Session - 02/15/22 1121     Visit Number 37    Number of Visits 62    Date for PT Re-Evaluation 03/09/22    Authorization Type PN 01/29/2021; PN 09/09/2021    Authorization Time Period IE 11/20/2020    PT Start Time 1116    PT Stop Time 1155    PT Time Calculation (min) 39 min    Activity Tolerance Patient tolerated treatment well    Behavior During Therapy [facility] for tasks assessed/performed             Past Medical History:  Diagnosis Date   Back pain    Past Surgical History:  Procedure Laterality Date   ABDOMINAL HYSTERECTOMY     COLONOSCOPY WITH PROPOFOL N/A 03/03/2021   Procedure: COLONOSCOPY WITH PROPOFOL;  Surgeon: Lesly Rubenstein, MD;  Location: [facility];  Service: Endoscopy;  Laterality: N/A;   TOTAL HIP ARTHROPLASTY Left 03/07/2019   Procedure: TOTAL HIP ARTHROPLASTY;  Surgeon: Dereck Leep, MD;  Location: [facility];  Service: Orthopedics;  Laterality: Left;   Patient Active Problem List   Diagnosis Date Noted   Eczema 03/07/2019   H/O total hip arthroplasty 03/07/2019   Primary osteoarthritis of left hip 07/12/2017   Osteopenia of neck of femur 03/24/2017   Hyperlipidemia, mixed 03/17/2017   Low back pain with sciatica 04/08/2015   Colon polyp 09/10/2013    REFERRING DIAG: M48.062 (ICD-10-CM) - Spinal stenosis, lumbar region with neurogenic claudication M51.36 (ICD-10-CM) - Other intervertebral disc degeneration, lumbar region M54.16 (ICD-10-CM) - Radiculopathy, lumbar region M47.816 (ICD-10-CM) - Spondylosis without myelopathy or radiculopathy, lumbar region   THERAPY DIAG:  Abnormal posture  Chronic low back pain, unspecified back pain laterality, unspecified whether sciatica present    PRECAUTIONS:  None  SUBJECTIVE: Patient notes she had excellent first session of acupuncture. Patient had positive trip to visit family, but did have increased pain after walking around aquarium and back to car.  PAIN:  Are you having pain? Yes: NPRS scale: 7/10   TREATMENT  Manual Therapy: Lateral gapping of R lumbar spine in sidelying Lateral glides of R Lumbar spine, grade II STM and TPR performed to R lumbar paraspinal mm, R gluteal, R QL mm to allow for decreased tension and pain and improved posture and function  Neuromuscular Re-education:      Patient educated throughout session on appropriate technique and form using multi-modal cueing, HEP, and activity modification. Patient articulated understanding and returned demonstration.  Patient Response to interventions: Notes improved mobility    PT Long Term Goals      PT LONG TERM GOAL #1   Title Patient will be independent with HEP in order to decrease back pain and increase strength in order to improve pain-free function at home and work.    Baseline IE: not initiated; 7/28: IND    Time 8    Period Weeks    Status Achieved      PT LONG TERM GOAL #2   Title Patient will demonstrate improved function as evidenced by a score of 59 on FOTO measure for full participation in activities at home and in the community.    Baseline IE: 50; 7/28: 57; 9/28: 56; 1/5: 61; 2/20: 46; 3/22: 39; 4/5: 63; 6/27: 55   Time 12  Period Weeks    Status On-going   Target Date 03/09/2022      PT LONG TERM GOAL #3   Title Patient will decrease worst pain as reported on NPRS by at least 2 points to demonstrate clinically significant reduction in pain in order to restore/improve function and overall QOL.    Baseline IE: 9/10; 7/28: AM 9/10, PM 6/10; 9/28: AM 7/10, PM 5/10; 11/23: 7/10 AM, 6/10 PM; 1/5: 7/10 AM, 5/10 PM; 2/20: 7/10 AM, 9/10 PM; 3/22: 10/10 AM; 9/10 STS; 4/5: 7/10 AM, 6-7/10 PM; 6/27: 5/10 AM, 6/10 PM prior to weekend of childcare   Time 6     Period Weeks    Status On-going    Target Date 03/09/2022      PT LONG TERM GOAL #4   Title Patient will be able to perform ADLs and community activities including laundry, bed making, grocery shopping, meal prep and walking the dog with pain controlled at or below 6/10 on NPRS for 24 hour period in order to improve function and QOL.    Baseline IE: 9/10 pain; 7/28: 7/10 pain; 9/28: 6-7/10 pain (changed linens, meal prep for mother, grocery, laundry); 11/23: 6-7/10 (changed linens, meal prep for mother, grocery, laundry); 1/5: 6/10 (laundry, grocery shopping); 2/20: 8-9/10 pain pakcing up mother's home; 3/22: 9-10/10 pain packing up mother's home; 4/5: 8/10 garage cleaning (2 hours); 6/27: 8/10 pain diffuse lumbar pain after 4 days of childcare   Time 6    Period Weeks    Status On-going    Target Date 03/09/2022             Plan     Clinical Impression Statement Patient presents to clinic with excellent motivation to participate in therapy. Patient standing more symmetrically after manual interventions during today's session and continues to respond well to both passive and active interventions in clinic and at home. Patient will benefit from continued skilled therapeutic intervention to address remaining deficits in spinal mobility, posture, postural strength/endurance, and body mechanics prior to discharging to self-management for maintenance of improved function and improved overall QOL.    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 stenosis, osteopenia, h/o of THA L, colon polyp    Examination-Activity Limitations Transfers;Squat;Bend;Lift;Locomotion Level;Carry;Reach Overhead;Stand    Examination-Participation Restrictions Laundry;Yard Work;Meal Prep;Shop;Cleaning    Stability/Clinical Decision Making Evolving/Moderate complexity     Rehab Potential Fair    PT Frequency 1x / week    PT Duration 12 weeks    PT Treatment/Interventions ADLs/Self Care Home Management;Aquatic Therapy;Cryotherapy;Electrical Stimulation;Moist Heat;Therapeutic exercise;Neuromuscular re-education;Patient/family education;Manual techniques;Taping;Energy conservation;Scar mobilization;Orthotic Fit/Training    PT Next Visit Plan abdominal strengthening progression, manual, scar massage    PT Home Exercise Plan gentle lumbar stretches, mermaid stretch    Consulted and Agree with Plan of Care Patient               Myles Gip PT, DPT [phone]  02/15/2022, 11:25 AM

## 2022-03-01 ENCOUNTER — Encounter: Payer: Self-pay | Admitting: Physical Therapy

## 2022-03-01 ENCOUNTER — Ambulatory Visit: Payer: Medicare Other | Attending: Family Medicine | Admitting: Physical Therapy

## 2022-03-01 DIAGNOSIS — G8929 Other chronic pain: Secondary | ICD-10-CM | POA: Insufficient documentation

## 2022-03-01 DIAGNOSIS — R293 Abnormal posture: Secondary | ICD-10-CM | POA: Diagnosis present

## 2022-03-01 DIAGNOSIS — M545 Low back pain, unspecified: Secondary | ICD-10-CM | POA: Insufficient documentation

## 2022-03-01 NOTE — Therapy (Signed)
OUTPATIENT PHYSICAL THERAPY TREATMENT NOTE   Patient Name: Lindsey Freeman MRN: [id number] DOB:09/05/1945, 76 y.o., female Today's Date: 03/01/2022  PCP: [name], MD REFERRING PROVIDER: Meeler, Sherren Kerns, FNP   PT End of Session - 03/01/22 1121     Visit Number 38    Number of Visits 33    Date for PT Re-Evaluation 03/09/22    Authorization Type PN 01/29/2021; PN 09/09/2021    Authorization Time Period IE 11/20/2020    PT Start Time 1115    PT Stop Time 1155    PT Time Calculation (min) 40 min    Activity Tolerance Patient tolerated treatment well    Behavior During Therapy [facility] for tasks assessed/performed             Past Medical History:  Diagnosis Date   Back pain    Past Surgical History:  Procedure Laterality Date   ABDOMINAL HYSTERECTOMY     COLONOSCOPY WITH PROPOFOL N/A 03/03/2021   Procedure: COLONOSCOPY WITH PROPOFOL;  Surgeon: Lesly Rubenstein, MD;  Location: [facility];  Service: Endoscopy;  Laterality: N/A;   TOTAL HIP ARTHROPLASTY Left 03/07/2019   Procedure: TOTAL HIP ARTHROPLASTY;  Surgeon: Dereck Leep, MD;  Location: [facility];  Service: Orthopedics;  Laterality: Left;   Patient Active Problem List   Diagnosis Date Noted   Eczema 03/07/2019   H/O total hip arthroplasty 03/07/2019   Primary osteoarthritis of left hip 07/12/2017   Osteopenia of neck of femur 03/24/2017   Hyperlipidemia, mixed 03/17/2017   Low back pain with sciatica 04/08/2015   Colon polyp 09/10/2013    REFERRING DIAG: M48.062 (ICD-10-CM) - Spinal stenosis, lumbar region with neurogenic claudication M51.36 (ICD-10-CM) - Other intervertebral disc degeneration, lumbar region M54.16 (ICD-10-CM) - Radiculopathy, lumbar region M47.816 (ICD-10-CM) - Spondylosis without myelopathy or radiculopathy, lumbar region   THERAPY DIAG:  Abnormal posture  Chronic low back pain, unspecified back pain laterality, unspecified whether sciatica present    PRECAUTIONS:  None  SUBJECTIVE: Patient reports that she did too much with house cleaning/organizing task and is still sore from that. Patient has continued to have good results with acupuncture.  PAIN:  Are you having pain? Yes: NPRS scale: 7/10   TREATMENT  Manual Therapy: Lateral gapping of R lumbar spine in sidelying Lateral glides of R Lumbar spine, grade II STM and TPR performed to R lumbar paraspinal mm, R gluteal, R QL mm to allow for decreased tension and pain and improved posture and function Sidelying R sacral border mobilizations for improved posture and pain  Neuromuscular Re-education:      Patient educated throughout session on appropriate technique and form using multi-modal cueing, HEP, and activity modification. Patient articulated understanding and returned demonstration.  Patient Response to interventions: Comfortable to return in 1 week    PT Long Term Goals      PT LONG TERM GOAL #1   Title Patient will be independent with HEP in order to decrease back pain and increase strength in order to improve pain-free function at home and work.    Baseline IE: not initiated; 7/28: IND    Time 8    Period Weeks    Status Achieved      PT LONG TERM GOAL #2   Title Patient will demonstrate improved function as evidenced by a score of 59 on FOTO measure for full participation in activities at home and in the community.    Baseline IE: 50; 7/28: 57; 9/28: 56; 1/5: 61; 2/20: 46;  3/22: 39; 4/5: 63; 6/27: 55   Time 12    Period Weeks    Status On-going   Target Date 03/09/2022      PT LONG TERM GOAL #3   Title Patient will decrease worst pain as reported on NPRS by at least 2 points to demonstrate clinically significant reduction in pain in order to restore/improve function and overall QOL.    Baseline IE: 9/10; 7/28: AM 9/10, PM 6/10; 9/28: AM 7/10, PM 5/10; 11/23: 7/10 AM, 6/10 PM; 1/5: 7/10 AM, 5/10 PM; 2/20: 7/10 AM, 9/10 PM; 3/22: 10/10 AM; 9/10 STS; 4/5: 7/10 AM, 6-7/10 PM;  6/27: 5/10 AM, 6/10 PM prior to weekend of childcare   Time 6    Period Weeks    Status On-going    Target Date 03/09/2022      PT LONG TERM GOAL #4   Title Patient will be able to perform ADLs and community activities including laundry, bed making, grocery shopping, meal prep and walking the dog with pain controlled at or below 6/10 on NPRS for 24 hour period in order to improve function and QOL.    Baseline IE: 9/10 pain; 7/28: 7/10 pain; 9/28: 6-7/10 pain (changed linens, meal prep for mother, grocery, laundry); 11/23: 6-7/10 (changed linens, meal prep for mother, grocery, laundry); 1/5: 6/10 (laundry, grocery shopping); 2/20: 8-9/10 pain pakcing up mother's home; 3/22: 9-10/10 pain packing up mother's home; 4/5: 8/10 garage cleaning (2 hours); 6/27: 8/10 pain diffuse lumbar pain after 4 days of childcare   Time 6    Period Weeks    Status On-going    Target Date 03/09/2022             Plan     Clinical Impression Statement Patient presents to clinic with excellent motivation to participate in therapy. Patient had increased R sided sacral ligament tension during today's session and responded well to manual interventions at the R lumbopelvic region. Patient will benefit from continued skilled therapeutic intervention to address remaining deficits in spinal mobility, posture, postural strength/endurance, and body mechanics prior to discharging to self-management for maintenance of improved function and improved overall QOL.    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 stenosis, osteopenia, h/o of THA L, colon polyp    Examination-Activity Limitations Transfers;Squat;Bend;Lift;Locomotion Level;Carry;Reach Overhead;Stand    Examination-Participation Restrictions Laundry;Yard Work;Meal Prep;Shop;Cleaning    Stability/Clinical Decision Making  Evolving/Moderate complexity    Rehab Potential Fair    PT Frequency 1x / week    PT Duration 12 weeks    PT Treatment/Interventions ADLs/Self Care Home Management;Aquatic Therapy;Cryotherapy;Electrical Stimulation;Moist Heat;Therapeutic exercise;Neuromuscular re-education;Patient/family education;Manual techniques;Taping;Energy conservation;Scar mobilization;Orthotic Fit/Training    PT Next Visit Plan abdominal strengthening progression, manual, scar massage    PT Home Exercise Plan gentle lumbar stretches, mermaid stretch    Consulted and Agree with Plan of Care Patient               Myles Gip PT, DPT [phone]  03/01/2022, 11:23 AM

## 2022-03-10 ENCOUNTER — Encounter: Payer: Medicare Other | Admitting: Physical Therapy

## 2022-03-15 ENCOUNTER — Encounter: Payer: Self-pay | Admitting: Physical Therapy

## 2022-03-15 ENCOUNTER — Ambulatory Visit: Payer: Medicare Other | Admitting: Physical Therapy

## 2022-03-15 DIAGNOSIS — R293 Abnormal posture: Secondary | ICD-10-CM | POA: Diagnosis not present

## 2022-03-15 DIAGNOSIS — G8929 Other chronic pain: Secondary | ICD-10-CM

## 2022-03-15 NOTE — Therapy (Signed)
OUTPATIENT PHYSICAL THERAPY TREATMENT NOTE   Patient Name: Lindsey Freeman MRN: [id number] DOB:Nov 21, 1945, 76 y.o., female Today's Date: 03/15/2022  PCP: [name], MD REFERRING PROVIDER: Meeler, Sherren Kerns, FNP   PT End of Session - 03/15/22 1122     Visit Number 39    Number of Visits 98    Date for PT Re-Evaluation 06/07/22    Authorization Type PN 01/29/2021; PN 09/09/2021    Authorization Time Period IE 11/20/2020    PT Start Time 1115    PT Stop Time 1155    PT Time Calculation (min) 40 min    Activity Tolerance Patient tolerated treatment well    Behavior During Therapy [facility] for tasks assessed/performed             Past Medical History:  Diagnosis Date   Back pain    Past Surgical History:  Procedure Laterality Date   ABDOMINAL HYSTERECTOMY     COLONOSCOPY WITH PROPOFOL N/A 03/03/2021   Procedure: COLONOSCOPY WITH PROPOFOL;  Surgeon: Lesly Rubenstein, MD;  Location: [facility];  Service: Endoscopy;  Laterality: N/A;   TOTAL HIP ARTHROPLASTY Left 03/07/2019   Procedure: TOTAL HIP ARTHROPLASTY;  Surgeon: Dereck Leep, MD;  Location: [facility];  Service: Orthopedics;  Laterality: Left;   Patient Active Problem List   Diagnosis Date Noted   Eczema 03/07/2019   H/O total hip arthroplasty 03/07/2019   Primary osteoarthritis of left hip 07/12/2017   Osteopenia of neck of femur 03/24/2017   Hyperlipidemia, mixed 03/17/2017   Low back pain with sciatica 04/08/2015   Colon polyp 09/10/2013    REFERRING DIAG: M48.062 (ICD-10-CM) - Spinal stenosis, lumbar region with neurogenic claudication M51.36 (ICD-10-CM) - Other intervertebral disc degeneration, lumbar region M54.16 (ICD-10-CM) - Radiculopathy, lumbar region M47.816 (ICD-10-CM) - Spondylosis without myelopathy or radiculopathy, lumbar region   THERAPY DIAG:  Abnormal posture - Plan: PT plan of care cert/re-cert  Chronic low back pain, unspecified back pain laterality, unspecified whether sciatica present  - Plan: PT plan of care cert/re-cert    PRECAUTIONS: None  SUBJECTIVE: Patient states that she had about 5-6 days of relief from acupuncture last week. Patient notes that she has epidural injection this Friday. Patient is a bit stiff this morning across B low back.   PAIN:  Are you having pain? Yes: NPRS scale: 7/10   TREATMENT  Manual Therapy: STM and TPR performed to R lumbar paraspinal mm, R gluteal, R QL mm to allow for decreased tension and pain and improved posture and function Prone B sacral border mobilizations for improved posture and pain with and without hip rotation PROM CPA/UPA (B), lumbar region, grade II/III, multiple 20 sec bouts   Neuromuscular Re-education:      Patient educated throughout session on appropriate technique and form using multi-modal cueing, HEP, and activity modification. Patient articulated understanding and returned demonstration.  Patient Response to interventions: Comfortable to return in ~2 weeks    PT Long Term Goals      PT LONG TERM GOAL #1   Title Patient will be independent with HEP in order to decrease back pain and increase strength in order to improve pain-free function at home and work.    Baseline IE: not initiated; 7/28: IND    Time 8    Period Weeks    Status Achieved      PT LONG TERM GOAL #2   Title Patient will demonstrate improved function as evidenced by a score of 59 on FOTO measure for  full participation in activities at home and in the community.    Baseline IE: 50; 7/28: 57; 9/28: 56; 1/5: 61; 2/20: 46; 3/22: 39; 4/5: 63; 6/27: 55   Time 12    Period Weeks    Status On-going   Target Date 06/07/2022      PT LONG TERM GOAL #3   Title Patient will decrease worst pain as reported on NPRS by at least 2 points to demonstrate clinically significant reduction in pain in order to restore/improve function and overall QOL.    Baseline IE: 9/10; 7/28: AM 9/10, PM 6/10; 9/28: AM 7/10, PM 5/10; 11/23: 7/10 AM, 6/10 PM;  1/5: 7/10 AM, 5/10 PM; 2/20: 7/10 AM, 9/10 PM; 3/22: 10/10 AM; 9/10 STS; 4/5: 7/10 AM, 6-7/10 PM; 6/27: 5/10 AM, 6/10 PM prior to weekend of childcare   Time 6    Period Weeks    Status On-going    Target Date 06/07/2022     PT LONG TERM GOAL #4   Title Patient will be able to perform ADLs and community activities including laundry, bed making, grocery shopping, meal prep and walking the dog with pain controlled at or below 6/10 on NPRS for 24 hour period in order to improve function and QOL.    Baseline IE: 9/10 pain; 7/28: 7/10 pain; 9/28: 6-7/10 pain (changed linens, meal prep for mother, grocery, laundry); 11/23: 6-7/10 (changed linens, meal prep for mother, grocery, laundry); 1/5: 6/10 (laundry, grocery shopping); 2/20: 8-9/10 pain pakcing up mother's home; 3/22: 9-10/10 pain packing up mother's home; 4/5: 8/10 garage cleaning (2 hours); 6/27: 8/10 pain diffuse lumbar pain after 4 days of childcare   Time 6    Period Weeks    Status On-going    Target Date 06/07/2022             Plan     Clinical Impression Statement Patient presents to clinic with excellent motivation to participate in therapy. Patient continues to respond well to manual interventions during today's session and is finding the appropriate balance in pain modulation approaches with acupuncture, pain management, and physical therapy. Patient will benefit from continued skilled therapeutic intervention to address remaining deficits in spinal mobility, posture, postural strength/endurance, and body mechanics prior to discharging to self-management for maintenance of improved function and improved overall QOL.    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 stenosis, osteopenia, h/o of THA L, colon polyp    Examination-Activity Limitations Transfers;Squat;Bend;Lift;Locomotion  Level;Carry;Reach Overhead;Stand    Examination-Participation Restrictions Laundry;Yard Work;Meal Prep;Shop;Cleaning    Stability/Clinical Decision Making Evolving/Moderate complexity    Rehab Potential Fair    PT Frequency 1x / week    PT Duration 12 weeks    PT Treatment/Interventions ADLs/Self Care Home Management;Aquatic Therapy;Cryotherapy;Electrical Stimulation;Moist Heat;Therapeutic exercise;Neuromuscular re-education;Patient/family education;Manual techniques;Taping;Energy conservation;Scar mobilization;Orthotic Fit/Training    Consulted and Agree with Plan of Care Patient               Myles Gip PT, DPT [phone]  03/15/2022, 1:30 PM

## 2022-04-06 ENCOUNTER — Ambulatory Visit: Payer: Medicare Other | Attending: Family Medicine | Admitting: Physical Therapy

## 2022-04-06 ENCOUNTER — Encounter: Payer: Self-pay | Admitting: Physical Therapy

## 2022-04-06 DIAGNOSIS — M545 Low back pain, unspecified: Secondary | ICD-10-CM | POA: Diagnosis present

## 2022-04-06 DIAGNOSIS — G8929 Other chronic pain: Secondary | ICD-10-CM | POA: Insufficient documentation

## 2022-04-06 DIAGNOSIS — R293 Abnormal posture: Secondary | ICD-10-CM | POA: Insufficient documentation

## 2022-04-06 NOTE — Therapy (Signed)
OUTPATIENT PHYSICAL THERAPY TREATMENT NOTE Physical Therapy Progress Note   Dates of reporting period  09/09/2021   to   04/06/2022    Patient Name: Lindsey Freeman MRN: [id number] DOB:September 27, 1945, 76 y.o., female Today's Date: 04/06/2022  PCP: [name], MD REFERRING PROVIDER: Meeler, Sherren Kerns, FNP   PT End of Session - 04/06/22 1119     Visit Number 40    Number of Visits 21    Date for PT Re-Evaluation 06/07/22    Authorization Type PN 01/29/2021; PN 09/09/2021; PN 04/06/2022    Authorization Time Period IE 11/20/2020    PT Start Time 1115    PT Stop Time 1155    PT Time Calculation (min) 40 min    Activity Tolerance Patient tolerated treatment well    Behavior During Therapy [facility] for tasks assessed/performed             Past Medical History:  Diagnosis Date   Back pain    Past Surgical History:  Procedure Laterality Date   ABDOMINAL HYSTERECTOMY     COLONOSCOPY WITH PROPOFOL N/A 03/03/2021   Procedure: COLONOSCOPY WITH PROPOFOL;  Surgeon: Lesly Rubenstein, MD;  Location: [facility] ENDOSCOPY;  Service: Endoscopy;  Laterality: N/A;   TOTAL HIP ARTHROPLASTY Left 03/07/2019   Procedure: TOTAL HIP ARTHROPLASTY;  Surgeon: Dereck Leep, MD;  Location: [facility] ORS;  Service: Orthopedics;  Laterality: Left;   Patient Active Problem List   Diagnosis Date Noted   Eczema 03/07/2019   H/O total hip arthroplasty 03/07/2019   Primary osteoarthritis of left hip 07/12/2017   Osteopenia of neck of femur 03/24/2017   Hyperlipidemia, mixed 03/17/2017   Low back pain with sciatica 04/08/2015   Colon polyp 09/10/2013    REFERRING DIAG: M48.062 (ICD-10-CM) - Spinal stenosis, lumbar region with neurogenic claudication M51.36 (ICD-10-CM) - Other intervertebral disc degeneration, lumbar region M54.16 (ICD-10-CM) - Radiculopathy, lumbar region M47.816 (ICD-10-CM) - Spondylosis without myelopathy or radiculopathy, lumbar region   THERAPY DIAG:  Abnormal posture  Chronic low back  pain, unspecified back pain laterality, unspecified whether sciatica present    PRECAUTIONS: None  SUBJECTIVE: Patient states that she had to do a lot of driving for various reasons and as a result had increased lumbopelvic pain and RLE. Patient notes that pain was made worse by sleeping in hotels as well 9/10.  Are you having pain? Yes: NPRS scale: 7/10   TREATMENT  Manual Therapy: STM and TPR performed to R lumbar paraspinal mm, R gluteal, R QL mm to allow for decreased tension and pain and improved posture and function Sidelying distraction of SIJ, R Sidelying R lumbar gapping  Neuromuscular Re-education: Patient education on postural mechanics in car seat for decreased pelvic obliquity/asymmetry     Patient educated throughout session on appropriate technique and form using multi-modal cueing, HEP, and activity modification. Patient articulated understanding and returned demonstration.  Patient Response to interventions: Comfortable to return in 2-3 weeks    PT Long Term Goals      PT LONG TERM GOAL #1   Title Patient will be independent with HEP in order to decrease back pain and increase strength in order to improve pain-free function at home and work.    Baseline IE: not initiated; 7/28: IND    Time 8    Period Weeks    Status Achieved      PT LONG TERM GOAL #2   Title Patient will demonstrate improved function as evidenced by a score of 59 on FOTO measure  for full participation in activities at home and in the community.    Baseline IE: 50; 7/28: 57; 9/28: 56; 1/5: 61; 2/20: 46; 3/22: 39; 4/5: 63; 6/27: 55   Time 12    Period Weeks    Status On-going   Target Date 06/07/2022      PT LONG TERM GOAL #3   Title Patient will decrease worst pain as reported on NPRS by at least 2 points to demonstrate clinically significant reduction in pain in order to restore/improve function and overall QOL.    Baseline IE: 9/10; 7/28: AM 9/10, PM 6/10; 9/28: AM 7/10, PM 5/10;  11/23: 7/10 AM, 6/10 PM; 1/5: 7/10 AM, 5/10 PM; 2/20: 7/10 AM, 9/10 PM; 3/22: 10/10 AM; 9/10 STS; 4/5: 7/10 AM, 6-7/10 PM; 6/27: 5/10 AM, 6/10 PM prior to weekend of childcare   Time 6    Period Weeks    Status On-going    Target Date 06/07/2022     PT LONG TERM GOAL #4   Title Patient will be able to perform ADLs and community activities including laundry, bed making, grocery shopping, meal prep and walking the dog with pain controlled at or below 6/10 on NPRS for 24 hour period in order to improve function and QOL.    Baseline IE: 9/10 pain; 7/28: 7/10 pain; 9/28: 6-7/10 pain (changed linens, meal prep for mother, grocery, laundry); 11/23: 6-7/10 (changed linens, meal prep for mother, grocery, laundry); 1/5: 6/10 (laundry, grocery shopping); 2/20: 8-9/10 pain pakcing up mother's home; 3/22: 9-10/10 pain packing up mother's home; 4/5: 8/10 garage cleaning (2 hours); 6/27: 8/10 pain diffuse lumbar pain after 4 days of childcare   Time 6    Period Weeks    Status On-going    Target Date 06/07/2022             Plan     Clinical Impression Statement Patient presents to clinic with excellent motivation to participate in therapy. Patient continues to respond well to manual interventions during today's session and has improved carryover outside of session with supportive pain modalities as well as HEP. Patient's condition has the potential to improve in response to therapy. Maximum improvement is yet to be obtained. The anticipated improvement is attainable and reasonable in a generally predictable time. Patient will benefit from continued skilled therapeutic intervention to address remaining deficits in spinal mobility, posture, postural strength/endurance, and body mechanics prior to discharging to self-management for maintenance of improved function and improved overall QOL.    Personal Factors and Comorbidities Behavior Pattern;Comorbidity 3+;Past/Current Experience;Time since onset of  injury/illness/exacerbation;Age    Comorbidities mixed hyperlipidemia, L4/5 spinal stenosis, osteopenia, h/o of THA L, colon polyp    Examination-Activity Limitations Transfers;Squat;Bend;Lift;Locomotion Level;Carry;Reach Overhead;Stand    Examination-Participation Restrictions Laundry;Yard Work;Meal Prep;Shop;Cleaning    Stability/Clinical Decision Making Evolving/Moderate complexity    Rehab Potential Fair    PT Frequency 1x / week    PT Duration 12 weeks    PT Treatment/Interventions ADLs/Self Care Home Management;Aquatic Therapy;Cryotherapy;Electrical Stimulation;Moist Heat;Therapeutic exercise;Neuromuscular re-education;Patient/family education;Manual techniques;Taping;Energy conservation;Scar mobilization;Orthotic Fit/Training    Consulted and Agree with Plan of Care Patient               Myles Gip PT, DPT [phone]  04/06/2022, 11:20 AM

## 2022-04-26 ENCOUNTER — Ambulatory Visit: Payer: Medicare Other | Attending: Family Medicine | Admitting: Physical Therapy

## 2022-04-26 ENCOUNTER — Encounter: Payer: Self-pay | Admitting: Physical Therapy

## 2022-04-26 DIAGNOSIS — R293 Abnormal posture: Secondary | ICD-10-CM | POA: Insufficient documentation

## 2022-04-26 DIAGNOSIS — M545 Low back pain, unspecified: Secondary | ICD-10-CM | POA: Insufficient documentation

## 2022-04-26 DIAGNOSIS — G8929 Other chronic pain: Secondary | ICD-10-CM | POA: Diagnosis present

## 2022-04-26 NOTE — Therapy (Signed)
OUTPATIENT PHYSICAL THERAPY TREATMENT NOTE  Patient Name: Lindsey Freeman MRN: [id number] DOB:[birth year], 76 y.o., female Today's Date: 04/26/2022  PCP: [name], MD REFERRING PROVIDER: Meeler, Sherren Kerns, FNP   PT End of Session - 04/26/22 1114     Visit Number 41    Number of Visits 54    Date for PT Re-Evaluation 06/07/22    Authorization Type PN 01/29/2021; PN 09/09/2021; PN 04/06/2022    Authorization Time Period IE 11/20/2020    PT Start Time 1115    PT Stop Time 1155    PT Time Calculation (min) 40 min    Activity Tolerance Patient tolerated treatment well    Behavior During Therapy [facility] for tasks assessed/performed             Past Medical History:  Diagnosis Date   Back pain    Past Surgical History:  Procedure Laterality Date   ABDOMINAL HYSTERECTOMY     COLONOSCOPY WITH PROPOFOL N/A 03/03/2021   Procedure: COLONOSCOPY WITH PROPOFOL;  Surgeon: Lesly Rubenstein, MD;  Location: [facility] ENDOSCOPY;  Service: Endoscopy;  Laterality: N/A;   TOTAL HIP ARTHROPLASTY Left 03/07/2019   Procedure: TOTAL HIP ARTHROPLASTY;  Surgeon: Dereck Leep, MD;  Location: [facility] ORS;  Service: Orthopedics;  Laterality: Left;   Patient Active Problem List   Diagnosis Date Noted   Eczema 03/07/2019   H/O total hip arthroplasty 03/07/2019   Primary osteoarthritis of left hip 07/12/2017   Osteopenia of neck of femur 03/24/2017   Hyperlipidemia, mixed 03/17/2017   Low back pain with sciatica 04/08/2015   Colon polyp 09/10/2013    REFERRING DIAG: M48.062 (ICD-10-CM) - Spinal stenosis, lumbar region with neurogenic claudication M51.36 (ICD-10-CM) - Other intervertebral disc degeneration, lumbar region M54.16 (ICD-10-CM) - Radiculopathy, lumbar region M47.816 (ICD-10-CM) - Spondylosis without myelopathy or radiculopathy, lumbar region   THERAPY DIAG:  Abnormal posture  Chronic low back pain, unspecified back pain laterality, unspecified whether sciatica present    PRECAUTIONS:  None  SUBJECTIVE: Patient notes that she is managing well with acupuncture, injections, and PT. Patient did adjust seat positioning in car with good effect.  Are you having pain? Yes: NPRS scale: 6/10   TREATMENT  Manual Therapy: STM and TPR performed to R lumbar paraspinal mm, R gluteal, R QL mm to allow for decreased tension and pain and improved posture and function Sidelying distraction of SIJ, R Sidelying R lumbar gapping  Neuromuscular Re-education:      Patient educated throughout session on appropriate technique and form using multi-modal cueing, HEP, and activity modification. Patient articulated understanding and returned demonstration.  Patient Response to interventions: Comfortable to return in 2 weeks    PT Long Term Goals      PT LONG TERM GOAL #1   Title Patient will be independent with HEP in order to decrease back pain and increase strength in order to improve pain-free function at home and work.    Baseline IE: not initiated; 7/28: IND    Time 8    Period Weeks    Status Achieved      PT LONG TERM GOAL #2   Title Patient will demonstrate improved function as evidenced by a score of 59 on FOTO measure for full participation in activities at home and in the community.    Baseline IE: 50; 7/28: 57; 9/28: 56; 1/5: 61; 2/20: 46; 3/22: 39; 4/5: 63; 6/27: 55   Time 12    Period Weeks    Status On-going  Target Date 06/07/2022      PT LONG TERM GOAL #3   Title Patient will decrease worst pain as reported on NPRS by at least 2 points to demonstrate clinically significant reduction in pain in order to restore/improve function and overall QOL.    Baseline IE: 9/10; 7/28: AM 9/10, PM 6/10; 9/28: AM 7/10, PM 5/10; 11/23: 7/10 AM, 6/10 PM; 1/5: 7/10 AM, 5/10 PM; 2/20: 7/10 AM, 9/10 PM; 3/22: 10/10 AM; 9/10 STS; 4/5: 7/10 AM, 6-7/10 PM; 6/27: 5/10 AM, 6/10 PM prior to weekend of childcare   Time 6    Period Weeks    Status On-going    Target Date 06/07/2022      PT LONG TERM GOAL #4   Title Patient will be able to perform ADLs and community activities including laundry, bed making, grocery shopping, meal prep and walking the dog with pain controlled at or below 6/10 on NPRS for 24 hour period in order to improve function and QOL.    Baseline IE: 9/10 pain; 7/28: 7/10 pain; 9/28: 6-7/10 pain (changed linens, meal prep for mother, grocery, laundry); 11/23: 6-7/10 (changed linens, meal prep for mother, grocery, laundry); 1/5: 6/10 (laundry, grocery shopping); 2/20: 8-9/10 pain pakcing up mother's home; 3/22: 9-10/10 pain packing up mother's home; 4/5: 8/10 garage cleaning (2 hours); 6/27: 8/10 pain diffuse lumbar pain after 4 days of childcare   Time 6    Period Weeks    Status On-going    Target Date 06/07/2022             Plan     Clinical Impression Statement Patient presents to clinic with excellent motivation to participate in therapy. Patient with increased soft tissue mobility compared to previous visit and was able to leave clinic with more symmetrical gait. Patient will benefit from continued skilled therapeutic intervention to address remaining deficits in spinal mobility, posture, postural strength/endurance, and body mechanics prior to discharging to self-management for maintenance of improved function and improved overall QOL.    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 stenosis, osteopenia, h/o of THA L, colon polyp    Examination-Activity Limitations Transfers;Squat;Bend;Lift;Locomotion Level;Carry;Reach Overhead;Stand    Examination-Participation Restrictions Laundry;Yard Work;Meal Prep;Shop;Cleaning    Stability/Clinical Decision Making Evolving/Moderate complexity    Rehab Potential Fair    PT Frequency 1x / week    PT Duration 12 weeks    PT Treatment/Interventions ADLs/Self Care Home  Management;Aquatic Therapy;Cryotherapy;Electrical Stimulation;Moist Heat;Therapeutic exercise;Neuromuscular re-education;Patient/family education;Manual techniques;Taping;Energy conservation;Scar mobilization;Orthotic Fit/Training    Consulted and Agree with Plan of Care Patient               Myles Gip PT, DPT [phone]  04/26/2022, 11:14 AM

## 2022-05-10 ENCOUNTER — Ambulatory Visit: Payer: Medicare Other | Admitting: Physical Therapy

## 2022-05-10 ENCOUNTER — Encounter: Payer: Self-pay | Admitting: Physical Therapy

## 2022-05-10 DIAGNOSIS — M545 Low back pain, unspecified: Secondary | ICD-10-CM

## 2022-05-10 DIAGNOSIS — R293 Abnormal posture: Secondary | ICD-10-CM | POA: Diagnosis not present

## 2022-05-10 NOTE — Therapy (Signed)
OUTPATIENT PHYSICAL THERAPY TREATMENT NOTE  Patient Name: Lindsey Freeman MRN: [id number] DOB:09-21-1945, 76 y.o., female Today's Date: 05/10/2022  PCP: [name], MD REFERRING PROVIDER: Meeler, Sherren Kerns, FNP   PT End of Session - 05/10/22 1118     Visit Number 42    Number of Visits 45    Date for PT Re-Evaluation 06/07/22    Authorization Type PN 01/29/2021; PN 09/09/2021; PN 04/06/2022    Authorization Time Period IE 11/20/2020    PT Start Time 1115    PT Stop Time 1155    PT Time Calculation (min) 40 min    Activity Tolerance Patient tolerated treatment well    Behavior During Therapy [facility] for tasks assessed/performed             Past Medical History:  Diagnosis Date   Back pain    Past Surgical History:  Procedure Laterality Date   ABDOMINAL HYSTERECTOMY     COLONOSCOPY WITH PROPOFOL N/A 03/03/2021   Procedure: COLONOSCOPY WITH PROPOFOL;  Surgeon: Lesly Rubenstein, MD;  Location: [facility] END[facility];  Service: Endoscopy;  Laterality: N/A;   TOTAL HIP ARTHROPLASTY Left 03/07/2019   Procedure: TOTAL HIP ARTHROPLASTY;  Surgeon: Dereck Leep, MD;  Location: [facility] ORS;  Service: Orthopedics;  Laterality: Left;   Patient Active Problem List   Diagnosis Date Noted   Eczema 03/07/2019   H/O total hip arthroplasty 03/07/2019   Primary osteoarthritis of left hip 07/12/2017   Osteopenia of neck of femur 03/24/2017   Hyperlipidemia, mixed 03/17/2017   Low back pain with sciatica 04/08/2015   Colon polyp 09/10/2013    REFERRING DIAG: M48.062 (ICD-10-CM) - Spinal stenosis, lumbar region with neurogenic claudication M51.36 (ICD-10-CM) - Other intervertebral disc degeneration, lumbar region M54.16 (ICD-10-CM) - Radiculopathy, lumbar region M47.816 (ICD-10-CM) - Spondylosis without myelopathy or radiculopathy, lumbar region   THERAPY DIAG:  Abnormal posture  Chronic low back pain, unspecified back pain laterality, unspecified whether sciatica present    PRECAUTIONS:  None  SUBJECTIVE: Patient reports that she is feeling stiff after a busy week of travel. Patient notes with increased postural demands and driving combined with sleeping on various mattresses.  Are you having pain? Yes: NPRS scale: 6/10   TREATMENT  Manual Therapy: STM and TPR performed to R lumbar paraspinal mm, R gluteal, R QL mm to allow for decreased tension and pain and improved posture and function, in prone B sacral border mobilizations with and without movement, R>L for improved mobility and pain  Neuromuscular Re-education:      Patient educated throughout session on appropriate technique and form using multi-modal cueing, HEP, and activity modification. Patient articulated understanding and returned demonstration.  Patient Response to interventions: Comfortable to return in 2 weeks    PT Long Term Goals      PT LONG TERM GOAL #1   Title Patient will be independent with HEP in order to decrease back pain and increase strength in order to improve pain-free function at home and work.    Baseline IE: not initiated; 7/28: IND    Time 8    Period Weeks    Status Achieved      PT LONG TERM GOAL #2   Title Patient will demonstrate improved function as evidenced by a score of 59 on FOTO measure for full participation in activities at home and in the community.    Baseline IE: 50; 7/28: 57; 9/28: 56; 1/5: 61; 2/20: 46; 3/22: 39; 4/5: 63; 6/27: 55   Time 12  Period Weeks    Status On-going   Target Date 06/07/2022      PT LONG TERM GOAL #3   Title Patient will decrease worst pain as reported on NPRS by at least 2 points to demonstrate clinically significant reduction in pain in order to restore/improve function and overall QOL.    Baseline IE: 9/10; 7/28: AM 9/10, PM 6/10; 9/28: AM 7/10, PM 5/10; 11/23: 7/10 AM, 6/10 PM; 1/5: 7/10 AM, 5/10 PM; 2/20: 7/10 AM, 9/10 PM; 3/22: 10/10 AM; 9/10 STS; 4/5: 7/10 AM, 6-7/10 PM; 6/27: 5/10 AM, 6/10 PM prior to weekend of childcare    Time 6    Period Weeks    Status On-going    Target Date 06/07/2022     PT LONG TERM GOAL #4   Title Patient will be able to perform ADLs and community activities including laundry, bed making, grocery shopping, meal prep and walking the dog with pain controlled at or below 6/10 on NPRS for 24 hour period in order to improve function and QOL.    Baseline IE: 9/10 pain; 7/28: 7/10 pain; 9/28: 6-7/10 pain (changed linens, meal prep for mother, grocery, laundry); 11/23: 6-7/10 (changed linens, meal prep for mother, grocery, laundry); 1/5: 6/10 (laundry, grocery shopping); 2/20: 8-9/10 pain pakcing up mother's home; 3/22: 9-10/10 pain packing up mother's home; 4/5: 8/10 garage cleaning (2 hours); 6/27: 8/10 pain diffuse lumbar pain after 4 days of childcare   Time 6    Period Weeks    Status On-going    Target Date 06/07/2022             Plan     Clinical Impression Statement Patient presents to clinic with excellent motivation to participate in therapy. Patient had increased soft tissue restriction at R posterior hip likely 2/2 to increased driving/travel, but had positive response subjectively to manual interventions and was able to bear greater weight through RLE upon leaving clinic. Patient will benefit from continued skilled therapeutic intervention to address remaining deficits in spinal mobility, posture, postural strength/endurance, and body mechanics prior to discharging to self-management for maintenance of improved function and improved overall QOL.    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 stenosis, osteopenia, h/o of THA L, colon polyp    Examination-Activity Limitations Transfers;Squat;Bend;Lift;Locomotion Level;Carry;Reach Overhead;Stand    Examination-Participation Restrictions Laundry;Yard Work;Meal Prep;Shop;Cleaning     Stability/Clinical Decision Making Evolving/Moderate complexity    Rehab Potential Fair    PT Frequency 1x / week    PT Duration 12 weeks    PT Treatment/Interventions ADLs/Self Care Home Management;Aquatic Therapy;Cryotherapy;Electrical Stimulation;Moist Heat;Therapeutic exercise;Neuromuscular re-education;Patient/family education;Manual techniques;Taping;Energy conservation;Scar mobilization;Orthotic Fit/Training    Consulted and Agree with Plan of Care Patient               Myles Gip PT, DPT [phone]  05/10/2022, 11:19 AM

## 2022-05-24 ENCOUNTER — Encounter: Payer: Self-pay | Admitting: Physical Therapy

## 2022-05-24 ENCOUNTER — Ambulatory Visit: Payer: Medicare Other | Attending: Family Medicine | Admitting: Physical Therapy

## 2022-05-24 DIAGNOSIS — G8929 Other chronic pain: Secondary | ICD-10-CM | POA: Diagnosis present

## 2022-05-24 DIAGNOSIS — R293 Abnormal posture: Secondary | ICD-10-CM | POA: Insufficient documentation

## 2022-05-24 DIAGNOSIS — M545 Low back pain, unspecified: Secondary | ICD-10-CM | POA: Diagnosis present

## 2022-05-24 NOTE — Therapy (Signed)
OUTPATIENT PHYSICAL THERAPY TREATMENT NOTE  Patient Name: Lindsey Freeman MRN: [id number] DOB:[birth year], 77 y.o., female Today's Date: 05/24/2022  PCP: [name], MD REFERRING PROVIDER: Meeler, Sherren Kerns, FNP   PT End of Session - 05/24/22 1345     Visit Number 43    Number of Visits 40    Date for PT Re-Evaluation 06/07/22    Authorization Type PN 01/29/2021; PN 09/09/2021; PN 04/06/2022    Authorization Time Period IE 11/20/2020    PT Start Time 1345    PT Stop Time 1425    PT Time Calculation (min) 40 min    Activity Tolerance Patient tolerated treatment well    Behavior During Therapy [facility] for tasks assessed/performed             Past Medical History:  Diagnosis Date   Back pain    Past Surgical History:  Procedure Laterality Date   ABDOMINAL HYSTERECTOMY     COLONOSCOPY WITH PROPOFOL N/A 03/03/2021   Procedure: COLONOSCOPY WITH PROPOFOL;  Surgeon: Lesly Rubenstein, MD;  Location: [facility];  Service: Endoscopy;  Laterality: N/A;   TOTAL HIP ARTHROPLASTY Left 03/07/2019   Procedure: TOTAL HIP ARTHROPLASTY;  Surgeon: Dereck Leep, MD;  Location: [facility];  Service: Orthopedics;  Laterality: Left;   Patient Active Problem List   Diagnosis Date Noted   Eczema 03/07/2019   H/O total hip arthroplasty 03/07/2019   Primary osteoarthritis of left hip 07/12/2017   Osteopenia of neck of femur 03/24/2017   Hyperlipidemia, mixed 03/17/2017   Low back pain with sciatica 04/08/2015   Colon polyp 09/10/2013    REFERRING DIAG: M48.062 (ICD-10-CM) - Spinal stenosis, lumbar region with neurogenic claudication M51.36 (ICD-10-CM) - Other intervertebral disc degeneration, lumbar region M54.16 (ICD-10-CM) - Radiculopathy, lumbar region M47.816 (ICD-10-CM) - Spondylosis without myelopathy or radiculopathy, lumbar region   THERAPY DIAG:  Abnormal posture  Chronic low back pain, unspecified back pain laterality, unspecified whether sciatica present    PRECAUTIONS:  None  SUBJECTIVE: Patient states that after doing some holiday decorating this morning she has had increased R sided lumbosacral pain/tension. Patient continues to utilize self-management strategies with good effect at home, but occasionally has worsening of symptoms from certain repetitive tasks. Are you having pain? Yes: NPRS scale: 6/10   TREATMENT  Manual Therapy: STM and TPR performed to R lumbar paraspinal mm, R gluteal, R QL mm to allow for decreased tension and pain and improved posture and function, in sidelying R sacral border mobilizations with and without movement, R>L for improved mobility and pain  Neuromuscular Re-education:      Patient educated throughout session on appropriate technique and form using multi-modal cueing, HEP, and activity modification. Patient articulated understanding and returned demonstration.  Patient Response to interventions: Comfortable to return in 1 week    PT Long Term Goals      PT LONG TERM GOAL #1   Title Patient will be independent with HEP in order to decrease back pain and increase strength in order to improve pain-free function at home and work.    Baseline IE: not initiated; 7/28: IND    Time 8    Period Weeks    Status Achieved      PT LONG TERM GOAL #2   Title Patient will demonstrate improved function as evidenced by a score of 59 on FOTO measure for full participation in activities at home and in the community.    Baseline IE: 50; 7/28: 57; 9/28: 56; 1/5: 61; 2/20:  46; 3/22: 39; 4/5: 63; 6/27: 55   Time 12    Period Weeks    Status On-going   Target Date 06/07/2022      PT LONG TERM GOAL #3   Title Patient will decrease worst pain as reported on NPRS by at least 2 points to demonstrate clinically significant reduction in pain in order to restore/improve function and overall QOL.    Baseline IE: 9/10; 7/28: AM 9/10, PM 6/10; 9/28: AM 7/10, PM 5/10; 11/23: 7/10 AM, 6/10 PM; 1/5: 7/10 AM, 5/10 PM; 2/20: 7/10 AM, 9/10  PM; 3/22: 10/10 AM; 9/10 STS; 4/5: 7/10 AM, 6-7/10 PM; 6/27: 5/10 AM, 6/10 PM prior to weekend of childcare   Time 6    Period Weeks    Status On-going    Target Date 06/07/2022     PT LONG TERM GOAL #4   Title Patient will be able to perform ADLs and community activities including laundry, bed making, grocery shopping, meal prep and walking the dog with pain controlled at or below 6/10 on NPRS for 24 hour period in order to improve function and QOL.    Baseline IE: 9/10 pain; 7/28: 7/10 pain; 9/28: 6-7/10 pain (changed linens, meal prep for mother, grocery, laundry); 11/23: 6-7/10 (changed linens, meal prep for mother, grocery, laundry); 1/5: 6/10 (laundry, grocery shopping); 2/20: 8-9/10 pain pakcing up mother's home; 3/22: 9-10/10 pain packing up mother's home; 4/5: 8/10 garage cleaning (2 hours); 6/27: 8/10 pain diffuse lumbar pain after 4 days of childcare   Time 6    Period Weeks    Status On-going    Target Date 06/07/2022             Plan     Clinical Impression Statement Patient presents to clinic with excellent motivation to participate in therapy. Patient had positive response to manual interventions at R lumbosacral region, but did continue to ambulate with antalgic gait at end of session. Patient will benefit from continued skilled therapeutic intervention to address remaining deficits in spinal mobility, posture, postural strength/endurance, and body mechanics prior to discharging to self-management for maintenance of improved function and improved overall QOL.    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 stenosis, osteopenia, h/o of THA L, colon polyp    Examination-Activity Limitations Transfers;Squat;Bend;Lift;Locomotion Level;Carry;Reach Overhead;Stand    Examination-Participation Restrictions Laundry;Yard Work;Meal  Prep;Shop;Cleaning    Stability/Clinical Decision Making Evolving/Moderate complexity    Rehab Potential Fair    PT Frequency 1x / week    PT Duration 12 weeks    PT Treatment/Interventions ADLs/Self Care Home Management;Aquatic Therapy;Cryotherapy;Electrical Stimulation;Moist Heat;Therapeutic exercise;Neuromuscular re-education;Patient/family education;Manual techniques;Taping;Energy conservation;Scar mobilization;Orthotic Fit/Training    Consulted and Agree with Plan of Care Patient               Myles Gip PT, DPT [phone]  05/24/2022, 1:45 PM

## 2022-05-31 ENCOUNTER — Ambulatory Visit: Payer: Medicare Other | Admitting: Physical Therapy

## 2022-05-31 ENCOUNTER — Encounter: Payer: Self-pay | Admitting: Physical Therapy

## 2022-05-31 DIAGNOSIS — G8929 Other chronic pain: Secondary | ICD-10-CM

## 2022-05-31 DIAGNOSIS — R293 Abnormal posture: Secondary | ICD-10-CM | POA: Diagnosis not present

## 2022-05-31 NOTE — Therapy (Signed)
OUTPATIENT PHYSICAL THERAPY TREATMENT NOTE  Patient Name: Lindsey Freeman MRN: [id number] DOB:[birth year], 76 y.o., female Today's Date: 05/31/2022  PCP: [name], MD REFERRING PROVIDER: Meeler, Sherren Kerns, FNP   PT End of Session - 05/31/22 1431     Visit Number 44    Number of Visits 8    Date for PT Re-Evaluation 06/07/22    Authorization Type PN 01/29/2021; PN 09/09/2021; PN 04/06/2022    Authorization Time Period IE 11/20/2020    PT Start Time 1430    PT Stop Time 1510    PT Time Calculation (min) 40 min    Activity Tolerance Patient tolerated treatment well    Behavior During Therapy [facility] for tasks assessed/performed             Past Medical History:  Diagnosis Date   Back pain    Past Surgical History:  Procedure Laterality Date   ABDOMINAL HYSTERECTOMY     COLONOSCOPY WITH PROPOFOL N/A 03/03/2021   Procedure: COLONOSCOPY WITH PROPOFOL;  Surgeon: Lesly Rubenstein, MD;  Location: [facility];  Service: Endoscopy;  Laterality: N/A;   TOTAL HIP ARTHROPLASTY Left 03/07/2019   Procedure: TOTAL HIP ARTHROPLASTY;  Surgeon: Dereck Leep, MD;  Location: [facility];  Service: Orthopedics;  Laterality: Left;   Patient Active Problem List   Diagnosis Date Noted   Eczema 03/07/2019   H/O total hip arthroplasty 03/07/2019   Primary osteoarthritis of left hip 07/12/2017   Osteopenia of neck of femur 03/24/2017   Hyperlipidemia, mixed 03/17/2017   Low back pain with sciatica 04/08/2015   Colon polyp 09/10/2013    REFERRING DIAG: M48.062 (ICD-10-CM) - Spinal stenosis, lumbar region with neurogenic claudication M51.36 (ICD-10-CM) - Other intervertebral disc degeneration, lumbar region M54.16 (ICD-10-CM) - Radiculopathy, lumbar region M47.816 (ICD-10-CM) - Spondylosis without myelopathy or radiculopathy, lumbar region   THERAPY DIAG:  Abnormal posture  Chronic low back pain, unspecified back pain laterality, unspecified whether sciatica present    PRECAUTIONS:  None  SUBJECTIVE: Patient reports that she feels she has improved mobility today but yesterday was a different story. Patient noted with increased dampness in yesterday's weather, her joint stiffness was worsened.  Are you having pain? Yes: NPRS scale: 6/10   TREATMENT  Manual Therapy: STM and TPR performed to R lumbar paraspinal mm, R gluteal, R hamstring mm to allow for decreased tension and pain and improved posture and function, in sidelying R sacral border mobilizations with and without movement, R>L for improved mobility and pain  Neuromuscular Re-education:   Therapeutic Exercise: Sidelying R quad stretch (PT assisted) Sidelying R iliopsoas stretch (PT assisted)  Patient educated throughout session on appropriate technique and form using multi-modal cueing, HEP, and activity modification. Patient articulated understanding and returned demonstration.  Patient Response to interventions: Comfortable to return in 2 weeks    PT Long Term Goals      PT LONG TERM GOAL #1   Title Patient will be independent with HEP in order to decrease back pain and increase strength in order to improve pain-free function at home and work.    Baseline IE: not initiated; 7/28: IND    Time 8    Period Weeks    Status Achieved      PT LONG TERM GOAL #2   Title Patient will demonstrate improved function as evidenced by a score of 59 on FOTO measure for full participation in activities at home and in the community.    Baseline IE: 50; 7/28: 57; 9/28: 56;  1/5: 61; 2/20: 46; 3/22: 39; 4/5: 63; 6/27: 55   Time 12    Period Weeks    Status On-going   Target Date 06/07/2022      PT LONG TERM GOAL #3   Title Patient will decrease worst pain as reported on NPRS by at least 2 points to demonstrate clinically significant reduction in pain in order to restore/improve function and overall QOL.    Baseline IE: 9/10; 7/28: AM 9/10, PM 6/10; 9/28: AM 7/10, PM 5/10; 11/23: 7/10 AM, 6/10 PM; 1/5: 7/10 AM,  5/10 PM; 2/20: 7/10 AM, 9/10 PM; 3/22: 10/10 AM; 9/10 STS; 4/5: 7/10 AM, 6-7/10 PM; 6/27: 5/10 AM, 6/10 PM prior to weekend of childcare   Time 6    Period Weeks    Status On-going    Target Date 06/07/2022     PT LONG TERM GOAL #4   Title Patient will be able to perform ADLs and community activities including laundry, bed making, grocery shopping, meal prep and walking the dog with pain controlled at or below 6/10 on NPRS for 24 hour period in order to improve function and QOL.    Baseline IE: 9/10 pain; 7/28: 7/10 pain; 9/28: 6-7/10 pain (changed linens, meal prep for mother, grocery, laundry); 11/23: 6-7/10 (changed linens, meal prep for mother, grocery, laundry); 1/5: 6/10 (laundry, grocery shopping); 2/20: 8-9/10 pain pakcing up mother's home; 3/22: 9-10/10 pain packing up mother's home; 4/5: 8/10 garage cleaning (2 hours); 6/27: 8/10 pain diffuse lumbar pain after 4 days of childcare   Time 6    Period Weeks    Status On-going    Target Date 06/07/2022             Plan     Clinical Impression Statement Patient presents to clinic with excellent motivation to participate in therapy. Patient had positive response to manual interventions at R lumbosacral region and was able to ambulate out of clinic with more symmetrical stride. Patient will benefit from continued skilled therapeutic intervention to address remaining deficits in spinal mobility, posture, postural strength/endurance, and body mechanics prior to discharging to self-management for maintenance of improved function and improved overall QOL.    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 stenosis, osteopenia, h/o of THA L, colon polyp    Examination-Activity Limitations Transfers;Squat;Bend;Lift;Locomotion Level;Carry;Reach Overhead;Stand    Examination-Participation Restrictions  Laundry;Yard Work;Meal Prep;Shop;Cleaning    Stability/Clinical Decision Making Evolving/Moderate complexity    Rehab Potential Fair    PT Frequency 1x / week    PT Duration 12 weeks    PT Treatment/Interventions ADLs/Self Care Home Management;Aquatic Therapy;Cryotherapy;Electrical Stimulation;Moist Heat;Therapeutic exercise;Neuromuscular re-education;Patient/family education;Manual techniques;Taping;Energy conservation;Scar mobilization;Orthotic Fit/Training    Consulted and Agree with Plan of Care Patient               Myles Gip PT, DPT [phone]  05/31/2022, 2:31 PM

## 2022-06-16 ENCOUNTER — Encounter: Payer: Medicare Other | Admitting: Physical Therapy

## 2022-06-24 ENCOUNTER — Ambulatory Visit: Payer: Medicare Other | Attending: Family Medicine | Admitting: Physical Therapy

## 2022-06-24 ENCOUNTER — Encounter: Payer: Self-pay | Admitting: Physical Therapy

## 2022-06-24 DIAGNOSIS — R293 Abnormal posture: Secondary | ICD-10-CM | POA: Insufficient documentation

## 2022-06-24 DIAGNOSIS — M545 Low back pain, unspecified: Secondary | ICD-10-CM | POA: Insufficient documentation

## 2022-06-24 DIAGNOSIS — G8929 Other chronic pain: Secondary | ICD-10-CM | POA: Insufficient documentation

## 2022-06-24 NOTE — Therapy (Signed)
OUTPATIENT PHYSICAL THERAPY TREATMENT NOTE  Patient Name: Lindsey Freeman MRN: [id number] DOB:1945/07/03, 77 y.o., female Today's Date: 06/24/2022  PCP: [name], MD REFERRING PROVIDER: Meeler, Sherren Kerns, FNP   PT End of Session - 06/24/22 1430     Visit Number 45    Number of Visits 62    Date for PT Re-Evaluation 09/16/22    Authorization Type PN 01/29/2021; PN 09/09/2021; PN 04/06/2022    Authorization Time Period IE 11/20/2020    PT Start Time 1430    PT Stop Time 1510    PT Time Calculation (min) 40 min    Activity Tolerance Patient tolerated treatment well    Behavior During Therapy [facility] for tasks assessed/performed             Past Medical History:  Diagnosis Date   Back pain    Past Surgical History:  Procedure Laterality Date   ABDOMINAL HYSTERECTOMY     COLONOSCOPY WITH PROPOFOL N/A 03/03/2021   Procedure: COLONOSCOPY WITH PROPOFOL;  Surgeon: Lesly Rubenstein, MD;  Location: [facility];  Service: Endoscopy;  Laterality: N/A;   TOTAL HIP ARTHROPLASTY Left 03/07/2019   Procedure: TOTAL HIP ARTHROPLASTY;  Surgeon: Dereck Leep, MD;  Location: [facility];  Service: Orthopedics;  Laterality: Left;   Patient Active Problem List   Diagnosis Date Noted   Eczema 03/07/2019   H/O total hip arthroplasty 03/07/2019   Primary osteoarthritis of left hip 07/12/2017   Osteopenia of neck of femur 03/24/2017   Hyperlipidemia, mixed 03/17/2017   Low back pain with sciatica 04/08/2015   Colon polyp 09/10/2013    REFERRING DIAG: M48.062 (ICD-10-CM) - Spinal stenosis, lumbar region with neurogenic claudication M51.36 (ICD-10-CM) - Other intervertebral disc degeneration, lumbar region M54.16 (ICD-10-CM) - Radiculopathy, lumbar region M47.816 (ICD-10-CM) - Spondylosis without myelopathy or radiculopathy, lumbar region   THERAPY DIAG:  Abnormal posture  Chronic low back pain, unspecified back pain laterality, unspecified whether sciatica present    PRECAUTIONS:  None  SUBJECTIVE: Patient states that she has been very busy and active this morning with putting away decorations and running errands. As a result, she is feeling increased pain and stiffness. Patient also had dental work yesterday, but with use of prescribed medication was able to maintain 4/10 pain during the procedure. Patient continues to report improved function and continued maintenance of improvement with the combination of acupuncture and physical therapy.  Are you having pain? Yes: NPRS scale: 6/10   TREATMENT  Manual Therapy: STM and TPR performed to B lumbar paraspinal mm, B gluteal, B hamstring mm to allow for decreased tension and pain and improved posture and function, in prone B sacral border mobilizations with and without movement for improved mobility and pain CPA mobilizations of T8-L5, grade II/III, for improved mobility and posture  Neuromuscular Re-education:   Therapeutic Exercise: Prone B quad stretch (PT assisted) Prone B iliopsoas stretch (PT assisted) Reassessed goals; see below.   Patient educated throughout session on appropriate technique and form using multi-modal cueing, HEP, and activity modification. Patient articulated understanding and returned demonstration.  Patient Response to interventions: Comfortable to return in 2 weeks    PT Long Term Goals      PT LONG TERM GOAL #1   Title Patient will be independent with HEP in order to decrease back pain and increase strength in order to improve pain-free function at home and work.    Baseline IE: not initiated; 7/28: IND    Time 8    Period  Weeks    Status Achieved      PT LONG TERM GOAL #2   Title Patient will demonstrate improved function as evidenced by a score of 59 on FOTO measure for full participation in activities at home and in the community.    Baseline IE: 50; 7/28: 57; 9/28: 56; 1/5: 61; 2/20: 46; 3/22: 39; 4/5: 63; 6/27: 55; 1/4: 60   Time 12    Period Weeks    Status MET   Target  Date 09/16/2022     PT LONG TERM GOAL #3   Title Patient will decrease worst pain as reported on NPRS by at least 2 points to demonstrate clinically significant reduction in pain in order to restore/improve function and overall QOL.    Baseline IE: 9/10; 7/28: AM 9/10, PM 6/10; 9/28: AM 7/10, PM 5/10; 11/23: 7/10 AM, 6/10 PM; 1/5: 7/10 AM, 5/10 PM; 2/20: 7/10 AM, 9/10 PM; 3/22: 10/10 AM; 9/10 STS; 4/5: 7/10 AM, 6-7/10 PM; 6/27: 5/10 AM, 6/10 PM prior to weekend of childcare; 1/4: 7/10   Time 12    Period Weeks    Status On-going    Target Date 09/16/2022     PT LONG TERM GOAL #4   Title Patient will be able to perform ADLs and community activities including laundry, bed making, grocery shopping, meal prep and walking the dog with pain controlled at or below 6/10 on NPRS for 24 hour period in order to improve function and QOL.    Baseline IE: 9/10 pain; 7/28: 7/10 pain; 9/28: 6-7/10 pain (changed linens, meal prep for mother, grocery, laundry); 11/23: 6-7/10 (changed linens, meal prep for mother, grocery, laundry); 1/5: 6/10 (laundry, grocery shopping); 2/20: 8-9/10 pain pakcing up mother's home; 3/22: 9-10/10 pain packing up mother's home; 4/5: 8/10 garage cleaning (2 hours); 6/27: 8/10 pain diffuse lumbar pain after 4 days of childcare; 1/4: 6/10 majority of the time   Time 12    Period Weeks    Status On-going    Target Date 09/16/2022             Plan     Clinical Impression Statement Patient presents to clinic with excellent motivation to participate in therapy. Patient has improved function and pain as indicated by goal reassessment during today's session and continues to indicate improved management of persistent pain with combination of physical therapy and acupuncture services (received elsewhere). Patient will benefit from continued skilled therapeutic intervention to address remaining deficits in spinal mobility, posture, postural strength/endurance, and body mechanics prior to  discharging to self-management for maintenance of improved function and improved overall QOL.    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 stenosis, osteopenia, h/o of THA L, colon polyp    Examination-Activity Limitations Transfers;Squat;Bend;Lift;Locomotion Level;Carry;Reach Overhead;Stand    Examination-Participation Restrictions Laundry;Yard Work;Meal Prep;Shop;Cleaning    Stability/Clinical Decision Making Evolving/Moderate complexity    Rehab Potential Fair    PT Frequency 1x / week    PT Duration 12 weeks    PT Treatment/Interventions ADLs/Self Care Home Management;Aquatic Therapy;Cryotherapy;Electrical Stimulation;Moist Heat;Therapeutic exercise;Neuromuscular re-education;Patient/family education;Manual techniques;Taping;Energy conservation;Scar mobilization;Orthotic Fit/Training    Consulted and Agree with Plan of Care Patient               Myles Gip PT, DPT [phone]  06/24/2022, 2:31 PM

## 2022-07-08 ENCOUNTER — Ambulatory Visit: Payer: Medicare Other | Admitting: Physical Therapy

## 2022-07-08 ENCOUNTER — Encounter: Payer: Self-pay | Admitting: Physical Therapy

## 2022-07-08 DIAGNOSIS — M545 Low back pain, unspecified: Secondary | ICD-10-CM

## 2022-07-08 DIAGNOSIS — R293 Abnormal posture: Secondary | ICD-10-CM | POA: Diagnosis not present

## 2022-07-08 NOTE — Therapy (Signed)
OUTPATIENT PHYSICAL THERAPY TREATMENT NOTE  Patient Name: Lindsey Freeman MRN: [id number] DOB:15-Feb-1946, 77 y.o., female Today's Date: 07/08/2022  PCP: [name], MD REFERRING PROVIDER: Meeler, Sherren Kerns, FNP   PT End of Session - 07/08/22 1348     Visit Number 46    Number of Visits 55    Date for PT Re-Evaluation 09/16/22    Authorization Type PN 01/29/2021; PN 09/09/2021; PN 04/06/2022    Authorization Time Period IE 11/20/2020    PT Start Time 1345    PT Stop Time 1425    PT Time Calculation (min) 40 min    Activity Tolerance Patient tolerated treatment well    Behavior During Therapy [facility] for tasks assessed/performed             Past Medical History:  Diagnosis Date   Back pain    Past Surgical History:  Procedure Laterality Date   ABDOMINAL HYSTERECTOMY     COLONOSCOPY WITH PROPOFOL N/A 03/03/2021   Procedure: COLONOSCOPY WITH PROPOFOL;  Surgeon: Lesly Rubenstein, MD;  Location: [facility] ENDOSCOPY;  Service: Endoscopy;  Laterality: N/A;   TOTAL HIP ARTHROPLASTY Left 03/07/2019   Procedure: TOTAL HIP ARTHROPLASTY;  Surgeon: Dereck Leep, MD;  Location: [facility] ORS;  Service: Orthopedics;  Laterality: Left;   Patient Active Problem List   Diagnosis Date Noted   Eczema 03/07/2019   H/O total hip arthroplasty 03/07/2019   Primary osteoarthritis of left hip 07/12/2017   Osteopenia of neck of femur 03/24/2017   Hyperlipidemia, mixed 03/17/2017   Low back pain with sciatica 04/08/2015   Colon polyp 09/10/2013    REFERRING DIAG: M48.062 (ICD-10-CM) - Spinal stenosis, lumbar region with neurogenic claudication M51.36 (ICD-10-CM) - Other intervertebral disc degeneration, lumbar region M54.16 (ICD-10-CM) - Radiculopathy, lumbar region M47.816 (ICD-10-CM) - Spondylosis without myelopathy or radiculopathy, lumbar region   THERAPY DIAG:  Abnormal posture  Chronic low back pain, unspecified back pain laterality, unspecified whether sciatica present    PRECAUTIONS:  None  SUBJECTIVE: Patient reports that she has been having improved night time pain management with addition of new muscle relaxant. Patient can tell a difference in rest with and without the medication. The medication has allowed improved morning function.  Are you having pain? Yes: NPRS scale: 6/10   TREATMENT  Manual Therapy: R innominate distraction for improved pain and posture R sacral border mobilizations for improved pain and posture STM and TPR performed to R hip mm to allow for decreased tension and pain and improved posture and function R LAD of hip for improved mobility   Neuromuscular Re-education:   Therapeutic Exercise:   Patient educated throughout session on appropriate technique and form using multi-modal cueing, HEP, and activity modification. Patient articulated understanding and returned demonstration.  Patient Response to interventions: Comfortable to return in 2 weeks    PT Long Term Goals      PT LONG TERM GOAL #1   Title Patient will be independent with HEP in order to decrease back pain and increase strength in order to improve pain-free function at home and work.    Baseline IE: not initiated; 7/28: IND    Time 8    Period Weeks    Status Achieved      PT LONG TERM GOAL #2   Title Patient will demonstrate improved function as evidenced by a score of 59 on FOTO measure for full participation in activities at home and in the community.    Baseline IE: 50; 7/28: 57; 9/28: 56;  1/5: 61; 2/20: 46; 3/22: 39; 4/5: 63; 6/27: 55; 1/4: 60   Time 12    Period Weeks    Status MET   Target Date 09/16/2022     PT LONG TERM GOAL #3   Title Patient will decrease worst pain as reported on NPRS by at least 2 points to demonstrate clinically significant reduction in pain in order to restore/improve function and overall QOL.    Baseline IE: 9/10; 7/28: AM 9/10, PM 6/10; 9/28: AM 7/10, PM 5/10; 11/23: 7/10 AM, 6/10 PM; 1/5: 7/10 AM, 5/10 PM; 2/20: 7/10 AM, 9/10 PM;  3/22: 10/10 AM; 9/10 STS; 4/5: 7/10 AM, 6-7/10 PM; 6/27: 5/10 AM, 6/10 PM prior to weekend of childcare; 1/4: 7/10   Time 12    Period Weeks    Status On-going    Target Date 09/16/2022     PT LONG TERM GOAL #4   Title Patient will be able to perform ADLs and community activities including laundry, bed making, grocery shopping, meal prep and walking the dog with pain controlled at or below 6/10 on NPRS for 24 hour period in order to improve function and QOL.    Baseline IE: 9/10 pain; 7/28: 7/10 pain; 9/28: 6-7/10 pain (changed linens, meal prep for mother, grocery, laundry); 11/23: 6-7/10 (changed linens, meal prep for mother, grocery, laundry); 1/5: 6/10 (laundry, grocery shopping); 2/20: 8-9/10 pain pakcing up mother's home; 3/22: 9-10/10 pain packing up mother's home; 4/5: 8/10 garage cleaning (2 hours); 6/27: 8/10 pain diffuse lumbar pain after 4 days of childcare; 1/4: 6/10 majority of the time   Time 12    Period Weeks    Status On-going    Target Date 09/16/2022             Plan     Clinical Impression Statement Patient presents to clinic with excellent motivation to participate in therapy. Patient with more elongated R stride length after manual interventions during today's session and is comfortable to follow up in 2 weeks. Patient will benefit from continued skilled therapeutic intervention to address remaining deficits in spinal mobility, posture, postural strength/endurance, and body mechanics prior to discharging to self-management for maintenance of improved function and improved overall QOL.    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 stenosis, osteopenia, h/o of THA L, colon polyp    Examination-Activity Limitations Transfers;Squat;Bend;Lift;Locomotion Level;Carry;Reach Overhead;Stand    Examination-Participation Restrictions  Laundry;Yard Work;Meal Prep;Shop;Cleaning    Stability/Clinical Decision Making Evolving/Moderate complexity    Rehab Potential Fair    PT Frequency 1x / week    PT Duration 12 weeks    PT Treatment/Interventions ADLs/Self Care Home Management;Aquatic Therapy;Cryotherapy;Electrical Stimulation;Moist Heat;Therapeutic exercise;Neuromuscular re-education;Patient/family education;Manual techniques;Taping;Energy conservation;Scar mobilization;Orthotic Fit/Training    Consulted and Agree with Plan of Care Patient               Myles Gip PT, DPT [phone]  07/08/2022, 1:49 PM

## 2022-07-21 ENCOUNTER — Ambulatory Visit: Payer: Medicare Other | Admitting: Physical Therapy

## 2022-08-18 ENCOUNTER — Encounter: Payer: Medicare Other | Admitting: Physical Therapy

## 2022-09-02 ENCOUNTER — Encounter: Payer: Medicare Other | Admitting: Physical Therapy

## 2022-09-20 ENCOUNTER — Ambulatory Visit: Payer: Medicare Other | Attending: Family Medicine

## 2022-09-20 DIAGNOSIS — M545 Low back pain, unspecified: Secondary | ICD-10-CM | POA: Insufficient documentation

## 2022-09-20 DIAGNOSIS — R293 Abnormal posture: Secondary | ICD-10-CM | POA: Diagnosis present

## 2022-09-20 DIAGNOSIS — G8929 Other chronic pain: Secondary | ICD-10-CM | POA: Insufficient documentation

## 2022-09-20 NOTE — Therapy (Signed)
OUTPATIENT PHYSICAL THERAPY TREATMENT NOTE/Re-evaluation/Updated Cert  Patient Name: Lindsey Freeman MRN: [id number] DOB:1945/07/16, 77 y.o., female Today's Date: 09/20/2022  PCP: [name], MD REFERRING PROVIDER: [name], MD   PT End of Session - 09/20/22 1548     Visit Number 80    Number of Visits 95    Date for PT Re-Evaluation 11/15/22    Authorization Type PN 09/20/22, 1x/week x 8 weeks (updated POC)    Authorization Time Period IE 11/20/2020    PT Start Time 1550    PT Stop Time 1605    PT Time Calculation (min) 15 min    Activity Tolerance Patient tolerated treatment well    Behavior During Therapy [facility] for tasks assessed/performed             Past Medical History:  Diagnosis Date   Back pain    Past Surgical History:  Procedure Laterality Date   ABDOMINAL HYSTERECTOMY     COLONOSCOPY WITH PROPOFOL N/A 03/03/2021   Procedure: COLONOSCOPY WITH PROPOFOL;  Surgeon: Lesly Rubenstein, MD;  Location: [facility] ENDOSCOPY;  Service: Endoscopy;  Laterality: N/A;   TOTAL HIP ARTHROPLASTY Left 03/07/2019   Procedure: TOTAL HIP ARTHROPLASTY;  Surgeon: Dereck Leep, MD;  Location: [facility] ORS;  Service: Orthopedics;  Laterality: Left;   Patient Active Problem List   Diagnosis Date Noted   Eczema 03/07/2019   H/O total hip arthroplasty 03/07/2019   Primary osteoarthritis of left hip 07/12/2017   Osteopenia of neck of femur 03/24/2017   Hyperlipidemia, mixed 03/17/2017   Low back pain with sciatica 04/08/2015   Colon polyp 09/10/2013    REFERRING DIAG: M48.062 (ICD-10-CM) - Spinal stenosis, lumbar region with neurogenic claudication M51.36 (ICD-10-CM) - Other intervertebral disc degeneration, lumbar region M54.16 (ICD-10-CM) - Radiculopathy, lumbar region M47.816 (ICD-10-CM) - Spondylosis without myelopathy or radiculopathy, lumbar region   THERAPY DIAG:  Abnormal posture  Chronic low back pain, unspecified back pain laterality, unspecified whether sciatica  present   PRECAUTIONS: None  SUBJECTIVE: Patient reports that she has been having improved night time pain management with addition of new muscle relaxant. Patient can tell a difference in rest with and without the medication. The medication has allowed improved morning function.  Are you having pain? Yes: NPRS scale: 6/10 Pt had an epidural in March before she traveled to [place].  And she took an oral course of prednisone (just ended yesterday). Her primary c/o is low back pain with R LE pain along lateral hip to anterior knee.  She walked ~5 miles a day and this was a lot more than she typically does.  Her usual routine involves: taking medication in morning (gabapentin and Tylenol) and muscle relaxer at night before sleeping.  Aggravating factors: stationary standing, especially on hard surface; prolonged sitting; walking down hills;  lying on stomach without a pillow or lying flat on her back  Alleviating factors: heating pad- this helps loosen the muscles in her back; hot shower before bed; hot is better than cold; she has been going to accupuncture tx (getting massage before tx)  24 hr behavior: feels worse around 4-6 pm while standing cooking dinner; she usually feels the best in late morning/early afternoon   Occupation: she is retired; she volunteers at [employer]; retired Publishing rights manager;  Home: she lives at home, has 2 little dogs and her husband at home; has stairs to get from garage- has to use railing and takes 1 step at a time (not reciprocal pattern), once  inside it is single level; has difficulty getting into car as the driver- lifting her R leg to get in.    She has a f/u with Whitney NP on 09/28/22  Pt's goal for PT: to keep up her strength up in her legs and back to do what she wants to do- volunteer work, housework, hobbies; and trying to not take a lot of medication for sx control   TREATMENT Re-evaluation (pt returning to PT after >2 month break  from tx) Gait: pt amb with decreased stance on R LE, decreased pelvic/trunk rotation noted Lumbar AROM: extension mod limited/painful, R side bend min limited, L min limited, flexion min limited Neuro screen: decreased R patellar tendon reflex compared to L, normal sensation to lt tough L2-S1 dermatomes, (+) weakness with R hip flexion 4/5 (painful), knee extension 4+/5, gr toe 4+/5 (+) R SLR at 55 deg with addition of DF (+) muscle tightness in R rectus femoris/R glute med/max- reproduced her typical sx (+) hypomobile CPA L1-5 but did not recreate her sx today  Manual Therapy: STM, manual stretch R rectus femoris/quadriceps group, R glute med Discussed HEP- how to perform self quad stretch with yoga strap prone and supine hooklyin glute stretch- daily in morning, 3x 30 sec ea Discussed PT goals/HEP  Neuromuscular Re-education:   Therapeutic Exercise:   Patient educated throughout session on appropriate technique and form using multi-modal cueing, HEP, and activity modification. Patient articulated understanding and returned demonstration.  Patient Response to interventions: F/u next week; verbalized understanding of HEP; will formally update in [place] at next visit    PT [place] #1   Title Patient will be independent with HEP in order to decrease back pain and increase strength in order to improve pain-free function at home and work.    Baseline IE: not initiated; 7/28: IND; 4/1- initiated   Time 8    Period Weeks    Status Re-established/Initial     PT LONG TERM GOAL #2   Title Patient will demonstrate improved function as evidenced by a score of 59 on FOTO measure for full participation in activities at home and in the community.    Baseline IE: 50; 7/28: 57; 9/28: 56; 1/5: 61; 2/20: 46; 3/22: 39; 4/5: 63; 6/27: 55; 1/4: 60; 09/20/22- will administer at next visit (the website was not functional during apt)   Time 18   Period Weeks    Status  In progress   Target Date 11/15/22     PT LONG TERM GOAL #3   Title Patient will decrease worst pain as reported on NPRS by at least 2 points to demonstrate clinically significant reduction in pain in order to restore/improve function and overall QOL.    Baseline IE: 9/10; 7/28: AM 9/10, PM 6/10; 9/28: AM 7/10, PM 5/10; 11/23: 7/10 AM, 6/10 PM; 1/5: 7/10 AM, 5/10 PM; 2/20: 7/10 AM, 9/10 PM; 3/22: 10/10 AM; 9/10 STS; 4/5: 7/10 AM, 6-7/10 PM; 6/27: 5/10 AM, 6/10 PM prior to weekend of childcare; 1/4: 7/10   Time 12    Period Weeks    Status On-going    Target Date 09/16/2022     PT LONG TERM GOAL #4   Title Patient will be able to perform ADLs and community activities including laundry, bed making, grocery shopping, meal prep and walking the dog with pain controlled at or below 6/10 on NPRS for 24 hour period in order to improve function  and QOL.    Baseline IE: 9/10 pain; 7/28: 7/10 pain; 9/28: 6-7/10 pain (changed linens, meal prep for mother, grocery, laundry); 11/23: 6-7/10 (changed linens, meal prep for mother, grocery, laundry); 1/5: 6/10 (laundry, grocery shopping); 2/20: 8-9/10 pain pakcing up mother's home; 3/22: 9-10/10 pain packing up mother's home; 4/5: 8/10 garage cleaning (2 hours); 6/27: 8/10 pain diffuse lumbar pain after 4 days of childcare; 1/4: 6/10 majority of the time   Time 12    Period Weeks    Status On-going    Target Date 09/16/2022             Plan     Clinical Impression Statement Patient returns to PT after a break in treatment that was unrelated to her and more related to recent changes at the clinic.  I re-evaluated her today and her presentation appears to be consistent with R lumbar radiculopathy and also (+) R hip mm overuse that correlates with a recent trip where she had to walk 5+ miles/day which is significantly longer than her usual walking.  She is an appropriate candidate to resumed skilled therapeutic intervention to address deficits in spinal mobility,  posture, postural strength/endurance, muscle dysfunction, and body mechanics.     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 stenosis, osteopenia, h/o of THA L, colon polyp    Examination-Activity Limitations Transfers;Squat;Bend;Lift;Locomotion Level;Carry;Reach Overhead;Stand    Examination-Participation Restrictions Laundry;Yard Work;Meal Prep;Shop;Cleaning    Stability/Clinical Decision Making Evolving/Moderate complexity    Rehab Potential Fair    PT Frequency 1x / week    PT Duration 12 weeks    PT Treatment/Interventions ADLs/Self Care Home Management;Aquatic Therapy;Cryotherapy;Electrical Stimulation;Moist Heat;Therapeutic exercise;Neuromuscular re-education;Patient/family education;Manual techniques;Taping;Energy conservation;Scar mobilization;Orthotic Fit/Training    Consulted and Agree with Plan of Care Patient           Merdis Delay, [place], DPT, [place]  #[id number]  09/20/2022, 5:27 PM

## 2022-09-29 ENCOUNTER — Ambulatory Visit: Payer: Medicare Other

## 2022-09-29 DIAGNOSIS — M545 Low back pain, unspecified: Secondary | ICD-10-CM

## 2022-09-29 DIAGNOSIS — R293 Abnormal posture: Secondary | ICD-10-CM | POA: Diagnosis not present

## 2022-09-29 NOTE — Therapy (Signed)
OUTPATIENT PHYSICAL THERAPY TREATMENT NOTE  Patient Name: Lindsey Freeman MRN: [id number] DOB:1946-06-10, 77 y.o., female Today's Date: 09/29/2022  PCP: Leim Fabry, MD REFERRING PROVIDER: Meeler, Jodelle Gross, FNP   PT End of Session - 09/29/22 1708     Visit Number 48    Number of Visits 56    Date for PT Re-Evaluation 11/15/22    Authorization Type PN 09/20/22, 1x/week x 8 weeks (updated POC)    Authorization Time Period IE 11/20/2020    PT Start Time 1550    PT Stop Time 1638    PT Time Calculation (min) 48 min    Activity Tolerance Patient tolerated treatment well    Behavior During Therapy [facility] for tasks assessed/performed             Past Medical History:  Diagnosis Date   Back pain    Past Surgical History:  Procedure Laterality Date   ABDOMINAL HYSTERECTOMY     COLONOSCOPY WITH PROPOFOL N/A 03/03/2021   Procedure: COLONOSCOPY WITH PROPOFOL;  Surgeon: Regis Bill, MD;  Location: [facility] ENDOSCOPY;  Service: Endoscopy;  Laterality: N/A;   TOTAL HIP ARTHROPLASTY Left 03/07/2019   Procedure: TOTAL HIP ARTHROPLASTY;  Surgeon: Donato Heinz, MD;  Location: [facility] ORS;  Service: Orthopedics;  Laterality: Left;   Patient Active Problem List   Diagnosis Date Noted   Eczema 03/07/2019   H/O total hip arthroplasty 03/07/2019   Primary osteoarthritis of left hip 07/12/2017   Osteopenia of neck of femur 03/24/2017   Hyperlipidemia, mixed 03/17/2017   Low back pain with sciatica 04/08/2015   Colon polyp 09/10/2013    REFERRING DIAG: M48.062 (ICD-10-CM) - Spinal stenosis, lumbar region with neurogenic claudication M51.36 (ICD-10-CM) - Other intervertebral disc degeneration, lumbar region M54.16 (ICD-10-CM) - Radiculopathy, lumbar region M47.816 (ICD-10-CM) - Spondylosis without myelopathy or radiculopathy, lumbar region   THERAPY DIAG:  Abnormal posture  Chronic low back pain, unspecified back pain laterality, unspecified whether sciatica present   PRECAUTIONS:  None FROM RE-EVALUATION NOTE 09/20/22: SUBJECTIVE: Patient reports that she has been having improved night time pain management with addition of new muscle relaxant. Patient can tell a difference in rest with and without the medication. The medication has allowed improved morning function.  Are you having pain? Yes: NPRS scale: 6/10 Pt had an epidural in March before she traveled to [place].  And she took an oral course of prednisone (just ended yesterday). Her primary c/o is low back pain with R LE pain along lateral hip to anterior knee.  She walked ~5 miles a day and this was a lot more than she typically does.  Her usual routine involves: taking medication in morning (gabapentin and Tylenol) and muscle relaxer at night before sleeping.  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    She has a f/u with Whitney NP on 09/28/22  Pt's goal for PT: to keep up her strength up in her legs and back to do what she wants to do- volunteer work, housework, hobbies; and trying to not take a lot of medication for sx control   TODAY'S TREATMENT:    Manual Therapy: Manual R QL stretch  in sidelying Manual STM R glute med/max/piriformis Post innominate rotation mob R: Gr III 30 seconds x 4 Long axis Distraction mob R LE: Gr III 30 sec x 4 Long axis IR/ER hip oscillation 30 seconds x 3 with PT   Therapeutic Exercise: Seated forward blue physioball roll outs x5, to L x5 Standing core mm retraining with blue t-band: chest press and rows 2x12   Reviewed her previous HEP routine and discussed which ones to continue focusing on  HEP: [facility]  Access Code: [facility] URL: [email or web address] Date: 09/29/2022 Prepared by: Max Fickle  Exercises - Supine Posterior Pelvic Tilt  - 10 reps - Supine Hip External Rotation Stretch  - 2 sets - 30 sec hold - Supine Lower Trunk Rotation  - 4 sets - 15 hold - Hooklying Single Knee to Chest Stretch  - 2 sets - 30 sec hold - Supine Hamstring Stretch  - 2 sets - 30 sec hold  Patient educated throughout session on appropriate technique and form using multi-modal cueing, HEP, and activity modification. Patient articulated understanding and returned demonstration.  Updated HEP today with addition of standing row (blue t-band) in neutral spine, slight knee flexion position 2x12     PT Long Term Goals      PT LONG TERM GOAL #1   Title Patient will be independent with HEP in order to decrease back pain and increase strength in order to improve pain-free function at home and work.    Baseline IE: not initiated; 7/28: IND; 4/1- initiated   Time 8    Period Weeks    Status Re-established/Initial     PT LONG TERM GOAL #2   Title Patient will demonstrate improved function as evidenced by a score of 59 on FOTO measure for full participation in activities at home and in the community.    Baseline IE: 50; 7/28: 57; 9/28: 56; 1/5: 61; 2/20: 46; 3/22: 39; 4/5: 63; 6/27: 55; 1/4: 60; 09/20/22- will administer at next visit (the website was not functional during apt)   Time 18   Period Weeks    Status In progress    Target Date 11/15/22     PT LONG TERM GOAL #3   Title Patient will decrease worst pain as reported on NPRS by at least 2 points to demonstrate clinically significant reduction in pain in order to restore/improve function and overall QOL.    Baseline IE: 9/10; 7/28: AM 9/10, PM 6/10; 9/28: AM 7/10, PM 5/10; 11/23: 7/10 AM, 6/10 PM; 1/5: 7/10 AM, 5/10 PM; 2/20: 7/10 AM, 9/10 PM; 3/22: 10/10 AM; 9/10 STS; 4/5: 7/10 AM, 6-7/10 PM; 6/27: 5/10 AM, 6/10 PM prior to weekend of childcare; 1/4: 7/10   Time 12    Period Weeks    Status On-going    Target Date 09/16/2022     PT LONG TERM GOAL #4   Title Patient will be able to perform ADLs and community activities including laundry, bed making, grocery shopping, meal prep  and walking the dog with pain controlled at or below 6/10 on NPRS for 24 hour period in order to improve function and QOL.    Baseline IE: 9/10 pain; 7/28: 7/10 pain; 9/28: 6-7/10 pain (changed linens, meal prep for mother, grocery, laundry); 11/23: 6-7/10 (changed linens, meal prep for mother, grocery, laundry); 1/5: 6/10 (laundry, grocery shopping); 2/20: 8-9/10 pain pakcing up mother's home; 3/22: 9-10/10 pain packing up mother's home; 4/5: 8/10 garage cleaning (2 hours); 6/27: 8/10 pain diffuse lumbar pain after 4 days of childcare; 1/4: 6/10 majority of the time   Time 12    Period Weeks    Status On-going    Target Date 09/16/2022             Plan     Clinical Impression Statement Patient arrived very stiff and had difficulty with sit to stand and amb with reduced trunk rotation into the clinic.  At end of session able to transfer sit to stand with ease.  She tolerated manual therapy techniques well and demonstrated ability to perform neutral spine core mm retraining.  Updated her HEP.  She is an appropriate candidate to resumed skilled therapeutic intervention to address deficits in spinal mobility, posture, postural strength/endurance, muscle dysfunction, and body mechanics.      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 stenosis, osteopenia, h/o of THA L, colon polyp    Examination-Activity Limitations Transfers;Squat;Bend;Lift;Locomotion Level;Carry;Reach Overhead;Stand    Examination-Participation Restrictions Laundry;Yard Work;Meal Prep;Shop;Cleaning    Stability/Clinical Decision Making Evolving/Moderate complexity    Rehab Potential Fair    PT Frequency 1x / week    PT Duration 12 weeks    PT Treatment/Interventions ADLs/Self Care Home Management;Aquatic Therapy;Cryotherapy;Electrical Stimulation;Moist Heat;Therapeutic exercise;Neuromuscular re-education;Patient/family education;Manual techniques;Taping;Energy conservation;Scar mobilization;Orthotic Fit/Training    Consulted and Agree with Plan of Care Patient           Max FickleRachel Arabelle Bollig, [place]PT, DPT, [place]  #[id number]  09/29/2022, 5:17 PM

## 2022-10-13 ENCOUNTER — Ambulatory Visit: Payer: Medicare Other

## 2022-10-13 DIAGNOSIS — R293 Abnormal posture: Secondary | ICD-10-CM

## 2022-10-13 DIAGNOSIS — M545 Other chronic pain: Secondary | ICD-10-CM

## 2022-10-13 NOTE — Therapy (Signed)
OUTPATIENT PHYSICAL THERAPY TREATMENT NOTE  Patient Name: Lindsey Freeman MRN: [id number] DOB:1946-04-10, 77 y.o., female Today's Date: 10/13/2022  PCP: Leim Fabry, MD REFERRING PROVIDER: Meeler, Jodelle Gross, FNP   PT End of Session - 10/13/22 1553     Visit Number 49    Number of Visits 56    Date for PT Re-Evaluation 11/15/22    Authorization Type PN 09/20/22, 1x/week x 8 weeks (updated POC)    Authorization Time Period IE 11/20/2020    PT Start Time 1550    PT Stop Time 1635    PT Time Calculation (min) 45 min    Activity Tolerance Patient tolerated treatment well    Behavior During Therapy [facility] for tasks assessed/performed             Past Medical History:  Diagnosis Date   Back pain    Past Surgical History:  Procedure Laterality Date   ABDOMINAL HYSTERECTOMY     COLONOSCOPY WITH PROPOFOL N/A 03/03/2021   Procedure: COLONOSCOPY WITH PROPOFOL;  Surgeon: Regis Bill, MD;  Location: [facility] ENDOSCOPY;  Service: Endoscopy;  Laterality: N/A;   TOTAL HIP ARTHROPLASTY Left 03/07/2019   Procedure: TOTAL HIP ARTHROPLASTY;  Surgeon: Donato Heinz, MD;  Location: [facility] ORS;  Service: Orthopedics;  Laterality: Left;   Patient Active Problem List   Diagnosis Date Noted   Eczema 03/07/2019   H/O total hip arthroplasty 03/07/2019   Primary osteoarthritis of left hip 07/12/2017   Osteopenia of neck of femur 03/24/2017   Hyperlipidemia, mixed 03/17/2017   Low back pain with sciatica 04/08/2015   Colon polyp 09/10/2013    REFERRING DIAG: M48.062 (ICD-10-CM) - Spinal stenosis, lumbar region with neurogenic claudication M51.36 (ICD-10-CM) - Other intervertebral disc degeneration, lumbar region M54.16 (ICD-10-CM) - Radiculopathy, lumbar region M47.816 (ICD-10-CM) - Spondylosis without myelopathy or radiculopathy, lumbar region   THERAPY DIAG:  Abnormal posture  Chronic low back pain, unspecified back pain laterality, unspecified whether sciatica present   PRECAUTIONS:  None FROM RE-EVALUATION NOTE 09/20/22: SUBJECTIVE: Patient reports that she has been having improved night time pain management with addition of new muscle relaxant. Patient can tell a difference in rest with and without the medication. The medication has allowed improved morning function.  Are you having pain? Yes: NPRS scale: 6/10 Pt had an epidural in March before she traveled to [place].  And she took an oral course of prednisone (just ended yesterday). Her primary c/o is low back pain with R LE pain along lateral hip to anterior knee.  She walked ~5 miles a day and this was a lot more than she typically does.  Her usual routine involves: taking medication in morning (gabapentin and Tylenol) and muscle relaxer at night before sleeping.  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    She has a f/u with Whitney NP on 09/28/22  Pt's goal for PT: to keep up her strength up in her legs and back to do what she wants to do- volunteer work, housework, hobbies; and trying to not take a lot of medication for sx control   TODAY'S TREATMENT:  Subjective:  Pt reports she bought a  lumbar compression/massage/heat soft brace type device to wear at home for pain control.  She worked on her HEP and feels like the strength exercises are going well.  Today she is sore upon arrival, she attributes this to the cold/rainy weather today.  Had a good acupuncture session this past week.  Pain: current 0-2/10 low back  Manual Therapy: Manual R QL stretch in sidelying Manual STM R glute med/max/piriformis Post innominate rotation mob R: Gr III 30 seconds x 4 Long axis Distraction mob R LE: Gr III 30 sec x 4 Long axis IR/ER hip oscillation 30 seconds x 3 with PT Belt distraction mob- lateral and inferior and with movement into hip flexion x10   Therapeutic Exercise: Seated forward blue physioball roll outs x5, to L x5- not with ball, showed pt how to use desk chair to perform this at home since she doesn't have a ball Standing core mm retraining with blue t-band: chest press and rows 2x12  - discussed, no practice in clinic   HEP: [facility]  Access Code: [facility] URL: [email or web address] Date: 09/29/2022 Prepared by: Max Fickle  Exercises - Supine Posterior Pelvic Tilt  - 10 reps - Supine Hip External Rotation Stretch  - 2 sets - 30 sec hold - Supine Lower Trunk Rotation  - 4 sets - 15 hold - Hooklying Single Knee to Chest Stretch  - 2 sets - 30 sec hold - Supine Hamstring Stretch  - 2 sets - 30 sec hold  Patient educated throughout session on appropriate technique and form using multi-modal cueing, HEP, and activity modification. Patient articulated understanding and returned demonstration.  Discussed adding standing hip self traction R off step to HEP to HEP.     PT Long Term Goals      PT LONG TERM GOAL #1   Title Patient will be independent with HEP in order to decrease back pain and increase strength in order to improve pain-free function at home and work.    Baseline IE: not initiated; 7/28: IND; 4/1- initiated   Time 8    Period  Weeks    Status Re-established/Initial     PT LONG TERM GOAL #2   Title Patient will demonstrate improved function as evidenced by a score of 59 on FOTO measure for full participation in activities at home and in the community.    Baseline IE: 50; 7/28: 57; 9/28: 56; 1/5: 61; 2/20: 46; 3/22: 39; 4/5: 63; 6/27: 55; 1/4: 60; 09/20/22- will administer at next visit (the website was not functional during apt)   Time 18   Period Weeks    Status In progress   Target Date 11/15/22     PT LONG TERM GOAL #3   Title Patient will decrease worst pain as reported on NPRS by at least 2 points to demonstrate clinically significant reduction in pain in order to restore/improve function and overall QOL.    Baseline IE: 9/10; 7/28: AM 9/10, PM 6/10; 9/28: AM 7/10, PM 5/10; 11/23:  7/10 AM, 6/10 PM; 1/5: 7/10 AM, 5/10 PM; 2/20: 7/10 AM, 9/10 PM; 3/22: 10/10 AM; 9/10 STS; 4/5: 7/10 AM, 6-7/10 PM; 6/27: 5/10 AM, 6/10 PM prior to weekend of childcare; 1/4: 7/10   Time 12    Period Weeks    Status On-going    Target Date 09/16/2022     PT LONG TERM GOAL #4   Title Patient will be able to perform ADLs and community activities including laundry, bed making, grocery shopping, meal prep and walking the dog with pain controlled at or below 6/10 on NPRS for 24 hour period in order to improve function and QOL.    Baseline IE: 9/10 pain; 7/28: 7/10 pain; 9/28: 6-7/10 pain (changed linens, meal prep for mother, grocery, laundry); 11/23: 6-7/10 (changed linens, meal prep for mother, grocery, laundry); 1/5: 6/10 (laundry, grocery shopping); 2/20: 8-9/10 pain pakcing up mother's home; 3/22: 9-10/10 pain packing up mother's home; 4/5: 8/10 garage cleaning (2 hours); 6/27: 8/10 pain diffuse lumbar pain after 4 days of childcare; 1/4: 6/10 majority of the time   Time 12    Period Weeks    Status On-going    Target Date 09/16/2022             Plan     Clinical Impression Statement Patient's R hip flexion AROM improved and her  ability to amb with upright posture improved after manual therapy today.  Reviewed her current HEP.  She is an appropriate candidate to resumed skilled therapeutic intervention to address deficits in spinal mobility, posture, postural strength/endurance, muscle dysfunction, and body mechanics.  Plan to progress core mm retraining at next session   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 stenosis, osteopenia, h/o of THA L, colon polyp    Examination-Activity Limitations Transfers;Squat;Bend;Lift;Locomotion Level;Carry;Reach Overhead;Stand    Examination-Participation Restrictions Laundry;Yard Work;Meal Prep;Shop;Cleaning    Stability/Clinical Decision Making Evolving/Moderate complexity    Rehab Potential Fair    PT Frequency 1x / week    PT Duration 12 weeks    PT Treatment/Interventions ADLs/Self Care Home Management;Aquatic Therapy;Cryotherapy;Electrical Stimulation;Moist Heat;Therapeutic exercise;Neuromuscular re-education;Patient/family education;Manual techniques;Taping;Energy conservation;Scar mobilization;Orthotic Fit/Training    Consulted and Agree with Plan of Care Patient           Max Fickle, [place], DPT, [place]  #[id number]  10/13/2022, 5:38 PM

## 2022-11-10 ENCOUNTER — Ambulatory Visit: Payer: Medicare Other | Attending: Family Medicine

## 2022-11-10 DIAGNOSIS — M545 Low back pain, unspecified: Secondary | ICD-10-CM | POA: Insufficient documentation

## 2022-11-10 DIAGNOSIS — R293 Abnormal posture: Secondary | ICD-10-CM | POA: Insufficient documentation

## 2022-11-10 DIAGNOSIS — G8929 Other chronic pain: Secondary | ICD-10-CM | POA: Diagnosis present

## 2022-11-10 NOTE — Therapy (Signed)
OUTPATIENT PHYSICAL THERAPY TREATMENT NOTE  Patient Name: Lindsey Freeman MRN: [id number] DOB:Jul 11, 1945, 77 y.o., female Today's Date: 11/10/2022  PCP: Leim Fabry, MD REFERRING PROVIDER: Meeler, Jodelle Gross, FNP   PT End of Session - 11/10/22 1504     Visit Number 50    Number of Visits 56    Date for PT Re-Evaluation 11/15/22    Authorization Type PN 09/20/22, 1x/week x 8 weeks (updated POC)    Authorization Time Period IE 11/20/2020    PT Start Time 1505    PT Stop Time 1550    PT Time Calculation (min) 45 min    Activity Tolerance Patient tolerated treatment well    Behavior During Therapy [facility] for tasks assessed/performed             Past Medical History:  Diagnosis Date   Back pain    Past Surgical History:  Procedure Laterality Date   ABDOMINAL HYSTERECTOMY     COLONOSCOPY WITH PROPOFOL N/A 03/03/2021   Procedure: COLONOSCOPY WITH PROPOFOL;  Surgeon: Regis Bill, MD;  Location: [facility] ENDOSCOPY;  Service: Endoscopy;  Laterality: N/A;   TOTAL HIP ARTHROPLASTY Left 03/07/2019   Procedure: TOTAL HIP ARTHROPLASTY;  Surgeon: Donato Heinz, MD;  Location: [facility] ORS;  Service: Orthopedics;  Laterality: Left;   Patient Active Problem List   Diagnosis Date Noted   Eczema 03/07/2019   H/O total hip arthroplasty 03/07/2019   Primary osteoarthritis of left hip 07/12/2017   Osteopenia of neck of femur 03/24/2017   Hyperlipidemia, mixed 03/17/2017   Low back pain with sciatica 04/08/2015   Colon polyp 09/10/2013    REFERRING DIAG: M48.062 (ICD-10-CM) - Spinal stenosis, lumbar region with neurogenic claudication M51.36 (ICD-10-CM) - Other intervertebral disc degeneration, lumbar region M54.16 (ICD-10-CM) - Radiculopathy, lumbar region M47.816 (ICD-10-CM) - Spondylosis without myelopathy or radiculopathy, lumbar region   THERAPY DIAG:  Abnormal posture  Chronic low back pain, unspecified back pain laterality, unspecified whether sciatica present   PRECAUTIONS:  None FROM RE-EVALUATION NOTE 09/20/22: SUBJECTIVE: Patient reports that she has been having improved night time pain management with addition of new muscle relaxant. Patient can tell a difference in rest with and without the medication. The medication has allowed improved morning function.  Are you having pain? Yes: NPRS scale: 6/10 Pt had an epidural in March before she traveled to [place].  And she took an oral course of prednisone (just ended yesterday). Her primary c/o is low back pain with R LE pain along lateral hip to anterior knee.  She walked ~5 miles a day and this was a lot more than she typically does.  Her usual routine involves: taking medication in morning (gabapentin and Tylenol) and muscle relaxer at night before sleeping.  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    She has a f/u with Whitney NP on 09/28/22  Pt's goal for PT: to keep up her strength up in her legs and back to do what she wants to do- volunteer work, housework, hobbies; and trying to not take a lot of medication for sx control   TODAY'S TREATMENT:  Subjective:  Pt reports she was traveling  to the beach for vacation.  Overall, she did well while she was there.  She came back and cleaned her house when she returned and her back feels stiff from that.  Made some changes and isn't taking gabapentin as much- not taking in the morning bc was making her stomach upset.  She got some relief from using the little soft compression/heat device.  Walking with a little limp on R LE.  Pain: current 0-2/10 low back Pt amb with decreased R terminal stance hip extension/antalgic gait  Manual Therapy: Manual R QL stretch in sidelying Manual STM R glute med/max/piriformis Post innominate rotation mob R: Gr III 30 seconds x 4 Long axis Distraction mob R LE: Gr III 30 sec x 4 Long axis IR/ER hip oscillation 30 seconds x 3 with PT Belt distraction mob- lateral and inferior and with movement into hip flexion x10 Sacral PA mob: Gr III 3x 30 seconds  Pt amb with improved R hip extension during terminal stance and improved step time symmetry compared to pre-manual tx  Therapeutic Exercise: Seated forward blue physioball roll outs x5, to L x5 Standing core mm retraining with blue t-band: chest press and rows 2x15 ea Standing pallof press with blue t-band b/l : x15 ea  FOTO:52/58   Access Code: [email or web address] URL: [email or web address] Date: 11/10/2022 Prepared by: Max Fickle  Exercises - Supine Posterior Pelvic Tilt  - 10 reps - Supine Hip External Rotation Stretch  - 2 sets - 30 sec hold - Supine Lower Trunk Rotation  - 4 sets - 15 hold - Hooklying Single Knee to Chest Stretch  - 2 sets - 30 sec hold - Supine Hamstring Stretch  - 2 sets - 30 sec hold - Standing Anti-Rotation Press with Anchored Resistance  - 1-2 sets - 15 reps  Patient educated throughout session on appropriate technique and form using multi-modal cueing, HEP, and activity modification. Patient articulated understanding and returned demonstration.  Discussed adding standing hip self traction R off step to HEP to  HEP.     PT Long Term Goals      PT LONG TERM GOAL #1   Title Patient will be independent with HEP in order to decrease back pain and increase strength in order to improve pain-free function at home and work.    Baseline IE: not initiated; 7/28: IND; 4/1- initiated   Time 8    Period Weeks    Status Re-established/Initial     PT LONG TERM GOAL #2   Title Patient will demonstrate improved function as evidenced by a score of 59 on FOTO measure for full participation in activities at home and in the community.    Baseline IE: 50; 7/28: 57; 9/28: 56; 1/5: 61; 2/20: 46; 3/22: 39; 4/5: 63; 6/27: 55; 1/4: 60; 09/20/22- will administer at next visit (the website was not functional during apt); 5/22: 52/58   Time 18   Period Weeks    Status In progress   Target Date 11/15/22  PT LONG TERM GOAL #3   Title Patient will decrease worst pain as reported on NPRS by at least 2 points to demonstrate clinically significant reduction in pain in order to restore/improve function and overall QOL.    Baseline IE: 9/10; 7/28: AM 9/10, PM 6/10; 9/28: AM 7/10, PM 5/10; 11/23: 7/10 AM, 6/10 PM; 1/5: 7/10 AM, 5/10 PM; 2/20: 7/10 AM, 9/10 PM; 3/22: 10/10 AM; 9/10 STS; 4/5: 7/10 AM, 6-7/10 PM; 6/27: 5/10 AM, 6/10 PM prior to weekend of childcare; 1/4: 7/10   Time 12    Period Weeks    Status On-going    Target Date 09/16/2022     PT LONG TERM GOAL #4   Title Patient will be able to perform ADLs and community activities including laundry, bed making, grocery shopping, meal prep and walking the dog with pain controlled at or below 6/10 on NPRS for 24 hour period in order to improve function and QOL.    Baseline IE: 9/10 pain; 7/28: 7/10 pain; 9/28: 6-7/10 pain (changed linens, meal prep for mother, grocery, laundry); 11/23: 6-7/10 (changed linens, meal prep for mother, grocery, laundry); 1/5: 6/10 (laundry, grocery shopping); 2/20: 8-9/10 pain pakcing up mother's home; 3/22: 9-10/10 pain packing up mother's home;  4/5: 8/10 garage cleaning (2 hours); 6/27: 8/10 pain diffuse lumbar pain after 4 days of childcare; 1/4: 6/10 majority of the time; 11/10/22: 6/10 pain cleaning her closet out   Time 12    Period Weeks    Status On-going    Target Date 09/16/2022             Plan     Clinical Impression Statement Pt continues to respond well to manual therapy as her gait mechanics improved after interventions today.  Able to progress core mm retraining in standing position today without pt noting increased pain during or after tx.  Pt is an appropriate candidate for skilled PT to continue manual therapy for pain/hypomobility in lumbopelvic region and for progressing core mm retraining to facilitate improved ability to perform her daily activities without being limited by pain.   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 stenosis, osteopenia, h/o of THA L, colon polyp    Examination-Activity Limitations Transfers;Squat;Bend;Lift;Locomotion Level;Carry;Reach Overhead;Stand    Examination-Participation Restrictions Laundry;Yard Work;Meal Prep;Shop;Cleaning    Stability/Clinical Decision Making Evolving/Moderate complexity    Rehab Potential Fair    PT Frequency 1x / week    PT Duration 12 weeks    PT Treatment/Interventions ADLs/Self Care Home Management;Aquatic Therapy;Cryotherapy;Electrical Stimulation;Moist Heat;Therapeutic exercise;Neuromuscular re-education;Patient/family education;Manual techniques;Taping;Energy conservation;Scar mobilization;Orthotic Fit/Training    Consulted and Agree with Plan of Care Patient ; plan to continue progressing with lumbopelvic/hip mm strengthening and manual therapy          Max Fickle, PT, DPT, OCS  [phone]  11/10/2022, 4:23 PM

## 2022-11-22 ENCOUNTER — Ambulatory Visit: Payer: Medicare Other | Attending: Family Medicine

## 2022-11-22 DIAGNOSIS — M545 Low back pain, unspecified: Secondary | ICD-10-CM | POA: Insufficient documentation

## 2022-11-22 DIAGNOSIS — G8929 Other chronic pain: Secondary | ICD-10-CM | POA: Diagnosis present

## 2022-11-22 DIAGNOSIS — R293 Abnormal posture: Secondary | ICD-10-CM | POA: Insufficient documentation

## 2022-11-22 NOTE — Therapy (Signed)
OUTPATIENT PHYSICAL THERAPY TREATMENT NOTE/PROGRESS NOTE/RE-CERTIFICATION NOTE  Patient Name: Lindsey Freeman MRN: [id number] DOB:1945-12-29, 77 y.o., female Today's Date: 11/22/2022  PCP: Leim Fabry, MD REFERRING PROVIDER: Meeler, Jodelle Gross, FNP   PT End of Session - 11/22/22 1414     Visit Number 51    Number of Visits 56    Date for PT Re-Evaluation 11/15/22    Authorization Type PN 09/20/22, 1x/week x 8 weeks (updated POC)    Authorization Time Period IE 11/20/2020    PT Start Time 1415    PT Stop Time 1500    PT Time Calculation (min) 45 min    Activity Tolerance Patient tolerated treatment well    Behavior During Therapy [facility] for tasks assessed/performed             Past Medical History:  Diagnosis Date   Back pain    Past Surgical History:  Procedure Laterality Date   ABDOMINAL HYSTERECTOMY     COLONOSCOPY WITH PROPOFOL N/A 03/03/2021   Procedure: COLONOSCOPY WITH PROPOFOL;  Surgeon: Regis Bill, MD;  Location: [facility] ENDOSCOPY;  Service: Endoscopy;  Laterality: N/A;   TOTAL HIP ARTHROPLASTY Left 03/07/2019   Procedure: TOTAL HIP ARTHROPLASTY;  Surgeon: Donato Heinz, MD;  Location: [facility] ORS;  Service: Orthopedics;  Laterality: Left;   Patient Active Problem List   Diagnosis Date Noted   Eczema 03/07/2019   H/O total hip arthroplasty 03/07/2019   Primary osteoarthritis of left hip 07/12/2017   Osteopenia of neck of femur 03/24/2017   Hyperlipidemia, mixed 03/17/2017   Low back pain with sciatica 04/08/2015   Colon polyp 09/10/2013    REFERRING DIAG: M48.062 (ICD-10-CM) - Spinal stenosis, lumbar region with neurogenic claudication M51.36 (ICD-10-CM) - Other intervertebral disc degeneration, lumbar region M54.16 (ICD-10-CM) - Radiculopathy, lumbar region M47.816 (ICD-10-CM) - Spondylosis without myelopathy or radiculopathy, lumbar region   THERAPY DIAG:  Abnormal posture  Chronic low back pain, unspecified back pain laterality, unspecified whether  sciatica present   PRECAUTIONS: None FROM RE-EVALUATION NOTE 09/20/22: SUBJECTIVE: Patient reports that she has been having improved night time pain management with addition of new muscle relaxant. Patient can tell a difference in rest with and without the medication. The medication has allowed improved morning function.  Are you having pain? Yes: NPRS scale: 6/10 Pt had an epidural in March before she traveled to [place].  And she took an oral course of prednisone (just ended yesterday). Her primary c/o is low back pain with R LE pain along lateral hip to anterior knee.  She walked ~5 miles a day and this was a lot more than she typically does.  Her usual routine involves: taking medication in morning (gabapentin and Tylenol) and muscle relaxer at night before sleeping.  Aggravating factors: stationary standing, especially on hard surface; prolonged sitting; walking down hills;  lying on stomach without a pillow or lying flat on her back  Alleviating factors: heating pad- this helps loosen the muscles in her back; hot shower before bed; hot is better than cold; she has been going to accupuncture tx (getting massage before tx)  24 hr behavior: feels worse around 4-6 pm while standing cooking dinner; she usually feels the best in late morning/early afternoon   Occupation: she is retired; she volunteers at [employer]; retired IT sales professional;  Home: she lives at home, has 2 little dogs and her husband at home; has stairs to get from garage- has to use railing and takes 1 step at a  time (not reciprocal pattern), once inside it is single level; has difficulty getting into car as the driver- lifting her R leg to get in.    She has a f/u with Whitney NP on 09/28/22  Pt's goal for PT: to keep up her strength up in her legs and back to do what she wants to do- volunteer work, housework, hobbies; and trying to not take a lot of medication for sx control   TODAY'S TREATMENT:   Subjective:  Pt reports last week her back bothered her, had to take meloxicam. She focused more on pain control vs her strengthening HEP.  R hip (anterior hip/groin) is painful with movement.    Pain: current 5/10 Pt amb with decreased R terminal stance hip extension/antalgic gait/R trendelenburg sign   Lumbar spine AROM:  (+) limited at end range flexion  Hip AROM R flexion 105 AROM and 110 PROM, (+) pain with both at end range, firm end feel R IR:  12  AROM, 15 PROM, (+) pain with both at end range, firm end feel  Manual Therapy: Manual R QL stretch in sidelying Manual STM R glute med/max/piriformis Post innominate rotation mob R: Gr III 30 seconds x 4 Sacral PA mob: Gr III 3x 30 seconds Lumbar L3-5 CPA mob Gr II/III 30 seconds  3 ea  Re-check lumbar AROM and pt has full flexion AROM and reports reduced LBP to 2/10  Long axis Distraction mob R LE: Gr III 30 sec x 4 Long axis IR/ER hip oscillation 30 seconds x 3 with PT Belt distraction mob- lateral and inferior and with movement into hip flexion x10 Pt amb with improved R hip extension during terminal stance and improved step time symmetry compared to pre-manual tx  Therapeutic Exercise: Standing mini squat with blue TB MWM post glide, practiced mutliple trials, PT tactile/verbal/demo cues, discussed how to perform for HEP and why this exercise is beneficial functionally, for strength, and as a strategy to promote less painful hip ROM   FOTO:52/58   Access Code: [email or web address] URL: [email or web address] Date: 11/10/2022 Prepared by: Max Fickle  Exercises - Supine Posterior Pelvic Tilt  - 10 reps - Supine Hip External Rotation Stretch  - 2 sets - 30 sec hold - Supine Lower Trunk Rotation  - 4 sets - 15 hold - Hooklying Single Knee to Chest Stretch  - 2 sets - 30 sec hold - Supine Hamstring Stretch  - 2 sets - 30 sec hold - Standing Anti-Rotation Press with Anchored Resistance  - 1-2 sets - 15  reps  Patient educated throughout session on appropriate technique and form using multi-modal cueing, HEP, and activity modification. Patient articulated understanding and returned demonstration.  Discussed adding standing hip self traction R off step to HEP to HEP.     PT Long Term Goals      PT LONG TERM GOAL #1   Title Patient will be independent with HEP in order to decrease back pain and increase strength in order to improve pain-free function at home and work.    Baseline IE: not initiated; 7/28: IND; 4/1- initiated updated; 6/3: progressed hip mm HEP component   Time 8    Period Weeks    Status In progress Target: 01/17/23     PT LONG TERM GOAL #2   Title Patient will demonstrate improved function as evidenced by a score of 59 on FOTO measure for full participation in activities at home and in the community.    Baseline IE: 50; 7/28:  57; 9/28: 56; 1/5: 61; 2/20: 46; 3/22: 39; 4/5: 63; 6/27: 55; 1/4: 60; 09/20/22- will administer at next visit (the website was not functional during apt); 5/22: 52/58   Time 8   Period Weeks    Status In progress   Target Date 01/17/23     PT LONG TERM GOAL #3   Title Patient will decrease worst pain as reported on NPRS by at least 2 points to demonstrate clinically significant reduction in pain in order to restore/improve function and overall QOL.    Baseline IE: 9/10; 7/28: AM 9/10, PM 6/10; 9/28: AM 7/10, PM 5/10; 11/23: 7/10 AM, 6/10 PM; 1/5: 7/10 AM, 5/10 PM; 2/20: 7/10 AM, 9/10 PM; 3/22: 10/10 AM; 9/10 STS; 4/5: 7/10 AM, 6-7/10 PM; 6/27: 5/10 AM, 6/10 PM prior to weekend of childcare; 1/4: 7/10; 6/3: 6/10   Time 8   Period Weeks    Status MET   Target Date 11/22/22     PT LONG TERM GOAL #4   Title Patient will be able to perform ADLs and community activities including laundry, bed making, grocery shopping, meal prep and walking the dog with pain controlled at or below 6/10 on NPRS for 24 hour period in order to improve function and QOL.     Baseline IE: 9/10 pain; 7/28: 7/10 pain; 9/28: 6-7/10 pain (changed linens, meal prep for mother, grocery, laundry); 11/23: 6-7/10 (changed linens, meal prep for mother, grocery, laundry); 1/5: 6/10 (laundry, grocery shopping); 2/20: 8-9/10 pain pakcing up mother's home; 3/22: 9-10/10 pain packing up mother's home; 4/5: 8/10 garage cleaning (2 hours); 6/27: 8/10 pain diffuse lumbar pain after 4 days of childcare; 1/4: 6/10 majority of the time; 11/10/22: 6/10 pain cleaning her closet out   Time 8   Period Weeks    Status In progress   Target Date 01/17/2023             Plan     Clinical Impression Statement Pt's lumbar spine AROM improved (specifically flexion) after manual therapy for lumbar spine/R SIJ today.  Her gait mechanics improved after manual therapy interventions for R hip today.  She does seem to be presenting with lumbar spine and hip joint components to her presentation.  R hip signs and sx consistent with possible R hip OA: IR AROM and PROM limited to <15 deg and painful, + FADDIR, + R trendelenburg sign, + pain with WB on R LE, + pain with squat, + pain and limited ROM with hip flexion AROM/PROM.  Progressed gluteal mm retraining with squat today using hip flex MWM banded technique.  Pt is an appropriate candidate for skilled PT to continue manual therapy for pain/hypomobility in lumbopelvic and hip region and for progressing lumbopelvic mm strengthening to facilitate improved ability to perform her daily activities without being limited by pain.  She has made some progress towards PT goals; planning to continue with POC, if pt reaches plateau then will refer back to MD for further evaluation/intervention.   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 stenosis, osteopenia, h/o of THA L, colon polyp    Examination-Activity Limitations  Transfers;Squat;Bend;Lift;Locomotion Level;Carry;Reach Overhead;Stand    Examination-Participation Restrictions Laundry;Yard Work;Meal Prep;Shop;Cleaning    Stability/Clinical Decision Making Evolving/Moderate complexity    Rehab Potential Fair    PT Frequency 1x / week    PT Duration 8 weeks    PT Treatment/Interventions ADLs/Self Care Home Management;Aquatic Therapy;Cryotherapy;Electrical Stimulation;Moist Heat;Therapeutic exercise;Neuromuscular  re-education;Patient/family education;Manual techniques;Taping;Energy conservation;Scar mobilization;Orthotic Fit/Training    Consulted and Agree with Plan of Care Patient ; plan to continue progressing with lumbopelvic/hip mm strengthening and manual therapy; added exercise to focus on gluteal mm activation into HEP today; also discussed recommendation of f/u with ortho for further diagnostic/intervention for R hip if sx and function do not further improve in next 8 weeks          Max Fickle, PT, DPT, OCS  [phone] 11/22/2022, 5:44 PM

## 2022-12-06 ENCOUNTER — Ambulatory Visit: Payer: Medicare Other

## 2022-12-13 ENCOUNTER — Ambulatory Visit: Payer: Medicare Other

## 2022-12-13 DIAGNOSIS — G8929 Other chronic pain: Secondary | ICD-10-CM

## 2022-12-13 DIAGNOSIS — R293 Abnormal posture: Secondary | ICD-10-CM | POA: Diagnosis not present

## 2022-12-13 NOTE — Therapy (Signed)
OUTPATIENT PHYSICAL THERAPY TREATMENT NOTE  Patient Name: Lindsey Freeman MRN: [id number] DOB:1946/06/21, 77 y.o., female Today's Date: 12/17/2022  PCP: Leim Fabry, MD REFERRING PROVIDER: Meeler, Jodelle Gross, FNP     Past Medical History:  Diagnosis Date   Back pain    Past Surgical History:  Procedure Laterality Date   ABDOMINAL HYSTERECTOMY     COLONOSCOPY WITH PROPOFOL N/A 03/03/2021   Procedure: COLONOSCOPY WITH PROPOFOL;  Surgeon: Regis Bill, MD;  Location: [facility] ENDOSCOPY;  Service: Endoscopy;  Laterality: N/A;   TOTAL HIP ARTHROPLASTY Left 03/07/2019   Procedure: TOTAL HIP ARTHROPLASTY;  Surgeon: Donato Heinz, MD;  Location: [facility] ORS;  Service: Orthopedics;  Laterality: Left;   Patient Active Problem List   Diagnosis Date Noted   Eczema 03/07/2019   H/O total hip arthroplasty 03/07/2019   Primary osteoarthritis of left hip 07/12/2017   Osteopenia of neck of femur 03/24/2017   Hyperlipidemia, mixed 03/17/2017   Low back pain with sciatica 04/08/2015   Colon polyp 09/10/2013    REFERRING DIAG: M48.062 (ICD-10-CM) - Spinal stenosis, lumbar region with neurogenic claudication M51.36 (ICD-10-CM) - Other intervertebral disc degeneration, lumbar region M54.16 (ICD-10-CM) - Radiculopathy, lumbar region M47.816 (ICD-10-CM) - Spondylosis without myelopathy or radiculopathy, lumbar region   THERAPY DIAG:  Abnormal posture  Chronic low back pain, unspecified back pain laterality, unspecified whether sciatica present   PRECAUTIONS: None FROM RE-EVALUATION NOTE 09/20/22: SUBJECTIVE: Patient reports that she has been having improved night time pain management with addition of new muscle relaxant. Patient can tell a difference in rest with and without the medication. The medication has allowed improved morning function.  Are you having pain? Yes: NPRS scale: 6/10 Pt had an epidural in March before she traveled to [place].  And she took an oral course of prednisone  (just ended yesterday). Her primary c/o is low back pain with R LE pain along lateral hip to anterior knee.  She walked ~5 miles a day and this was a lot more than she typically does.  Her usual routine involves: taking medication in morning (gabapentin and Tylenol) and muscle relaxer at night before sleeping.  Aggravating factors: stationary standing, especially on hard surface; prolonged sitting; walking down hills;  lying on stomach without a pillow or lying flat on her back  Alleviating factors: heating pad- this helps loosen the muscles in her back; hot shower before bed; hot is better than cold; she has been going to accupuncture tx (getting massage before tx)  24 hr behavior: feels worse around 4-6 pm while standing cooking dinner; she usually feels the best in late morning/early afternoon   Occupation: she is retired; she volunteers at [employer]; retired IT sales professional;  Home: she lives at home, has 2 little dogs and her husband at home; has stairs to get from garage- has to use railing and takes 1 step at a time (not reciprocal pattern), once inside it is single level; has difficulty getting into car as the driver- lifting her R leg to get in.    She has a f/u with Whitney NP on 09/28/22  Pt's goal for PT: to keep up her strength up in her legs and back to do what she wants to do- volunteer work, housework, hobbies; and trying to not take a lot of medication for sx control   TODAY'S TREATMENT: 12/13/22 Subjective:  Pt reports overall her R LE is bothering her; she feels like the humidity makes her feel worse.  She continues  ot focus on pain control at home, trying a topical anti-inflammatory cream.  Worked on HEP, did a modified version of the squat without the band bc there was not a good spot to attach the band at home.  R hip (anterior hip/groin) is painful with movement.    Pain: current 5/10 Pt amb with decreased R terminal stance hip extension/antalgic gait/R  trendelenburg sign  Hip AROM R flexion 105 AROM and 110 PROM, (+) pain with both at end range, firm end feel R IR:  12  AROM, 15 PROM, (+) pain with both at end range, firm end feel  Manual Therapy: Manual R QL stretch in sidelying Manual STM R glute med/max/piriformis/R TFL/vastus lateralis Post innominate rotation mob R: Gr III 30 seconds x 4 Sacral PA mob: Gr III 3x 30 seconds- not today Lumbar L3-5 CPA mob Gr II/III 30 seconds  3 ea- not today  Long axis Distraction mob R LE: Gr III 30 sec x 4 Long axis IR/ER hip oscillation 30 seconds x 3 with PT Supine hooklying R hip posterior glide 1 at various angles of hip flexion: Gr II/III 30 second bouts Belt distraction mob- lateral and inferior and with movement into hip flexion x10- not today Pt amb with improved R hip extension during terminal stance and improved step time symmetry compared to pre-manual tx  Therapeutic Exercise: Standing mini squat at wall today;  PT tactile/verbal/demo cues, discussed how to perform for HEP and why this exercise is beneficial functionally, for strength, and as a strategy to promote less painful hip ROM   FOTO:52/58 (from previous session note)   Access Code: [facility] URL: [email or web address] Date: 11/10/2022 Prepared by: Max Fickle  Exercises - Supine Posterior Pelvic Tilt  - 10 reps - Supine Hip External Rotation Stretch  - 2 sets - 30 sec hold - Supine Lower Trunk Rotation  - 4 sets - 15 hold - Hooklying Single Knee to Chest Stretch  - 2 sets - 30 sec hold - Supine Hamstring Stretch  - 2 sets - 30 sec hold - Standing Anti-Rotation Press with Anchored Resistance  - 1-2 sets - 15 reps  Patient educated throughout session on appropriate technique and form using multi-modal cueing, HEP, and activity modification. Patient articulated understanding and returned demonstration.  Discussed adding standing hip self traction R off step to HEP to HEP.     PT Long Term Goals       PT LONG TERM GOAL #1   Title Patient will be independent with HEP in order to decrease back pain and increase strength in order to improve pain-free function at home and work.    Baseline IE: not initiated; 7/28: IND; 4/1- initiated updated; 6/3: progressed hip mm HEP component   Time 8    Period Weeks    Status In progress Target: 01/17/23     PT LONG TERM GOAL #2   Title Patient will demonstrate improved function as evidenced by a score of 59 on FOTO measure for full participation in activities at home and in the community.    Baseline IE: 50; 7/28: 57; 9/28: 56; 1/5: 61; 2/20: 46; 3/22: 39; 4/5: 63; 6/27: 55; 1/4: 60; 09/20/22- will administer at next visit (the website was not functional during apt); 5/22: 52/58   Time 8   Period Weeks    Status In progress   Target Date 01/17/23     PT LONG TERM GOAL #3   Title Patient will decrease worst pain as reported on  NPRS by at least 2 points to demonstrate clinically significant reduction in pain in order to restore/improve function and overall QOL.    Baseline IE: 9/10; 7/28: AM 9/10, PM 6/10; 9/28: AM 7/10, PM 5/10; 11/23: 7/10 AM, 6/10 PM; 1/5: 7/10 AM, 5/10 PM; 2/20: 7/10 AM, 9/10 PM; 3/22: 10/10 AM; 9/10 STS; 4/5: 7/10 AM, 6-7/10 PM; 6/27: 5/10 AM, 6/10 PM prior to weekend of childcare; 1/4: 7/10; 6/3: 6/10   Time 8   Period Weeks    Status MET   Target Date 11/22/22     PT LONG TERM GOAL #4   Title Patient will be able to perform ADLs and community activities including laundry, bed making, grocery shopping, meal prep and walking the dog with pain controlled at or below 6/10 on NPRS for 24 hour period in order to improve function and QOL.    Baseline IE: 9/10 pain; 7/28: 7/10 pain; 9/28: 6-7/10 pain (changed linens, meal prep for mother, grocery, laundry); 11/23: 6-7/10 (changed linens, meal prep for mother, grocery, laundry); 1/5: 6/10 (laundry, grocery shopping); 2/20: 8-9/10 pain pakcing up mother's home; 3/22: 9-10/10 pain packing up  mother's home; 4/5: 8/10 garage cleaning (2 hours); 6/27: 8/10 pain diffuse lumbar pain after 4 days of childcare; 1/4: 6/10 majority of the time; 11/10/22: 6/10 pain cleaning her closet out   Time 8   Period Weeks    Status In progress   Target Date 01/17/2023             Plan     Clinical Impression Statement Pt hasn't noticed significant improvement in function or pain since previous PT session.  Overall she responds well with decreased pain and slight improvement in gait pattern within session, but improvements are not carrying over between sessions.  Discussed following up with her orthopedist for further evaluation of R hip/LE sx and her spine MD to discuss further evaluation/intervention at this time and hold on PT for now.     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 stenosis, osteopenia, h/o of THA L, colon polyp    Examination-Activity Limitations Transfers;Squat;Bend;Lift;Locomotion Level;Carry;Reach Overhead;Stand    Examination-Participation Restrictions Laundry;Yard Work;Meal Prep;Shop;Cleaning    Stability/Clinical Decision Making Evolving/Moderate complexity    Rehab Potential Fair    PT Frequency 1x / week    PT Duration 8 weeks    PT Treatment/Interventions ADLs/Self Care Home Management;Aquatic Therapy;Cryotherapy;Electrical Stimulation;Moist Heat;Therapeutic exercise;Neuromuscular re-education;Patient/family education;Manual techniques;Taping;Energy conservation;Scar mobilization;Orthotic Fit/Training    Consulted and Agree with Plan of Care discussed continuing HEP to maintain strength/function/mobility and also recommendation to /u with ortho for further diagnostic/intervention for R hip if sx and f/u with her spine MD for further evaluation/intervention          Max Fickle, PT, DPT, OCS  [phone] 12/17/2022, 11:42  AM

## 2023-06-01 ENCOUNTER — Other Ambulatory Visit: Payer: Self-pay | Admitting: Orthopedic Surgery

## 2023-06-01 DIAGNOSIS — M1712 Unilateral primary osteoarthritis, left knee: Secondary | ICD-10-CM

## 2023-06-01 DIAGNOSIS — M2392 Unspecified internal derangement of left knee: Secondary | ICD-10-CM

## 2023-06-13 ENCOUNTER — Ambulatory Visit
Admission: RE | Admit: 2023-06-13 | Discharge: 2023-06-13 | Disposition: A | Discharge status: Home / Self Care | Payer: Medicare Other | Source: Ambulatory Visit | Attending: Orthopedic Surgery | Admitting: Orthopedic Surgery

## 2023-06-13 DIAGNOSIS — M2392 Unspecified internal derangement of left knee: Secondary | ICD-10-CM | POA: Diagnosis present

## 2023-06-13 DIAGNOSIS — M1712 Unilateral primary osteoarthritis, left knee: Secondary | ICD-10-CM | POA: Insufficient documentation

## 2023-07-10 NOTE — Discharge Instructions (Addendum)
Instructions after Total Hip Replacement   Lindsey P. Angie Lindsey Freeman., M.D.    Dept. of Orthopaedics & Sports Medicine [facility] [address] [place], [place]  [address]  Phone: [phone]   Fax: [phone]        [email or web address]        DIET: Drink plenty of non-alcoholic fluids. Resume your normal diet. Include foods high in fiber.  ACTIVITY:  You may use crutches or a walker with weight-bearing as tolerated, unless instructed otherwise. You may be weaned off of the walker or crutches by your Physical Therapist.  Do NOT reach below the level of your knees or cross your legs until allowed.    Continue doing gentle exercises. Exercising will reduce the pain and swelling, increase motion, and prevent muscle weakness.   Please continue to use the TED compression stockings for 6 weeks. You may remove the stockings at night, but should reapply them in the morning. Do not drive or operate any equipment until instructed.  WOUND CARE:  Continue to use ice packs periodically to reduce pain and swelling. Keep the incision clean and dry. You may bathe or shower after the staples are removed at the first office visit following surgery.  MEDICATIONS: You may resume your regular medications. Please take the pain medication as prescribed on the medication. Do not take pain medication on an empty stomach. You have been given a prescription for a blood thinner to prevent blood clots. Please take the medication as instructed. (NOTE: After completing a 2 week course of Lovenox, take one Enteric-coated aspirin once a day.) Pain medications and iron supplements can cause constipation. Use a stool softener (Senokot or Colace) on a daily basis and a laxative (dulcolax or miralax) as needed. Do not drive or drink alcoholic beverages when taking pain medications.  CALL THE OFFICE FOR: Temperature above 101 degrees Excessive bleeding or drainage on the dressing. Excessive swelling,  coldness, or paleness of the toes. Persistent nausea and vomiting.  FOLLOW-UP:  You should have an appointment to return to the office in 6 weeks after surgery. Arrangements have been made for continuation of Physical Therapy (either home therapy or outpatient therapy).     [facility] Department Directory         [email or web address]       [email or web address]          Cardiology  Appointments: [place] - [phone]  Endocrinology  Appointments: [place] [phone] [place] - [phone]  Gastroenterology  Appointments: [place] [phone] [place] - [phone]        General Surgery   Appointments: [facility]  Internal Medicine/Family Medicine  Appointments: [facility] [place] - [phone] [place] - [phone]  Metabolic and Weigh Loss Surgery  Appointments: [facility]        Neurology  Appointments: [place] [phone] [place] - [phone]  Neurosurgery  Appointments: [place]  Obstetrics & Gynecology  Appointments: [place] [phone] [place] - [phone]        Pediatrics  Appointments: [place] [phone] [place] - [phone]  Physiatry  Appointments: [place] [phone]  Physical Therapy  Appointments: [place] - [phone]        Podiatry  Appointments: [place] [phone] [place] - [phone]  Pulmonology  Appointments: [place]  Rheumatology  Appointments: [place] [phone]        [place] Location: [facility]  [address] [place], [place]  [address]  [place] Location: [facility] [address] [place], [place]  [address]  [place] Location: [facility] [address] [place], [place]  [address]     [employer]  [address] , [place], [place], [address]. [phone] They will  call you to arrange when they can come to see you

## 2023-07-10 NOTE — H&P (Signed)
ORTHOPAEDIC HISTORY & PHYSICAL Lindsey Freeman, [place] - 07/04/2023 1:45 PM EST Formatting of this note is different from the original. Images from the original note were not included. Chief Complaint: Chief Complaint Patient presents with Right Hip - Pre-op Exam  Reason for Visit: The patient is a 78 y.o. female who presents today for history and physical for an upcoming right total hip arthroplasty to be done by Dr. Ernest Pine on July 18, 2023. The patient was seen by Dr. Ernest Pine in November with her husband for reevaluation of her right hip. She reports a long history of right hip and groin pain that has rapidly progressed. The pain is worse with weight bearing and rotation of the hip . The hip pain limits the patient's ability to ambulate long distances. The patient has not appreciated any significant improvement despite Tylenol, NSAIDs, intraarticular corticosteroid injection, and activity modification. She is not using any ambulatory aids. The patient states that the hip pain has progressed to the point that it is significantly interfering with her activities of daily living.  She is over 4 years.sp left total hip arthroplasty. She denies any left hip or thigh pain.  Medications: Current Outpatient Medications Medication Sig Dispense Refill acetaminophen (TYLENOL) 500 MG tablet Take 500 mg by mouth once daily as needed alendronate (FOSAMAX) 70 MG tablet Take 1 tablet (70 mg total) by mouth every 7 (seven) days Take on an empty stomach with a full glass of water. Avoid mineral or well water. Do not eat or take other medications for at least 30 minutes after dose. Sit or stand for at least 30 minutes after dose. (Patient not taking: Reported on 06/30/2023) 4 tablet 11 amoxicillin (AMOXIL) 500 MG capsule Take 2,000 mg by mouth once as a single dose one hour before dental appt. BIOTIN ORAL Take by mouth calcitonin, salmon, (MIACALCIN) 200 unit/actuation nasal spray Place 1 spray into the left  nostril once daily (Patient not taking: Reported on 06/30/2023) 3.7 mL 11 cetirizine (ZYRTEC) 10 MG tablet Take 10 mg by mouth daily. fluticasone (FLONASE SENSIMIST) 27.5 mcg/actuation nasal spray Place 1 spray into both nostrils once daily gabapentin (NEURONTIN) 100 MG capsule TAKE 1 CAPSULE IN THE MORNING AND 2 CAPSULES AT NIGHT AS TOLERATED (Patient taking differently: TAKE 1 CAPSULE IN THE MORNING AND 1 CAPSULES AT NIGHT AS TOLERATED) 270 capsule 3 gabapentin (NEURONTIN) 100 MG capsule Take 1 capsule (100 mg total) by mouth 2 (two) times daily 180 capsule 3 meloxicam (MOBIC) 15 MG tablet Take 15 mg by mouth once daily as needed for Pain multivitamin with minerals tablet Take 1 tablet by mouth daily. oxymetazoline (AFRIN) 0.05 % nasal spray Place 1 spray into both nostrils nightly as needed rosuvastatin (CRESTOR) 10 MG tablet Take 1 tablet (10 mg total) by mouth once daily 90 tablet 3 tiZANidine (ZANAFLEX) 4 MG tablet TAKE 1 TABLET (4 MG TOTAL) BY MOUTH EVERY DAY AT BEDTIME AS NEEDED 90 tablet 1  No current facility-administered medications for this visit.  Allergies: Allergies Allergen Reactions Duloxetine Diarrhea and Nausea  Past Medical History: Past Medical History: Diagnosis Date Allergic state Colon polyp 09/18/2008 Adenomatous Polyps Dry eyes Eczema, unspecified Food intolerance GERD (gastroesophageal reflux disease) 2000 When eating certain foods. High cholesterol Hyperlipidemia, mixed 03/17/2017 2018- The 10-year ASCVD risk score Denman George DC Jr., et al., 2013) is: 14%- offered statin. Joint pain Osteoarthritis 2019 Seasonal allergies  Past Surgical History: Past Surgical History: Procedure Laterality Date HYSTERECTOMY 1998 TAH/BSO for fibroids HYSTERECTOMY SUPRACERVICAL ABDOMINAL W/REMOVAL TUBES &/OR OVARIES  1998 COLONOSCOPY 09/18/2008 Adenomatous Polyps COLONOSCOPY 12/25/2013 Adenomatous Polyp: CBF 12/2018 Recall ltr mailed Left total hip arthroplasty  03/07/2019 Dr Ernest Pine COLONOSCOPY 03/03/2021 Normal colon biopsy/PHx CP/No repeat due to age/CTL JOINT REPLACEMENT 2020 Left Hip OOPHORECTOMY  Social History: Social History  Socioeconomic History Marital status: Married Spouse name: Dorinda Hill Number of children: 1 Years of education: 12 Highest education level: High school graduate Occupational History Occupation: Retired Tobacco Use Smoking status: Never Smokeless tobacco: Never Vaping Use Vaping status: Never Used Substance and Sexual Activity Alcohol use: Yes Alcohol/week: 5.0 standard drinks of alcohol Types: 5 Glasses of wine per week Comment: wine only Drug use: No Sexual activity: Not Currently Partners: Male Birth control/protection: None Comment: Due to arthritis issues for both of [place]. 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 Has adopted son. Walks 20-63min daily. Diet is well balanced with veggies, fruits and proteins.  05/27/2021 Military Service: None Likes/Enjoys/What fills your day?: Narrative Home: One Oncologist is on: First Level Fewest Steps to enter the home: 12 with railings Other persons in the home: husband Pets: Chief Executive Officer you use daily: None Medical equipment available in the home: None Dental: no significant dental problems. Last screening Date: June 2022. Screening is: Up to date. Vision: Denies any issues with vision. Last screening Date: January 2022. Screening is: Up to date Hearing: Denies any issues in Hearing . Dermatology: Denies areas of concern. Last screening Date: June 2021 Screening is: Up to date   Social Drivers of [employer]: Low Risk (01/06/2023) Overall Financial Resource Strain (CARDIA) Difficulty of Paying Living Expenses: Not hard at all Food Insecurity: No Food Insecurity (01/06/2023) Hunger Vital Sign Worried About Running Out of Food in the Last Year: Never true Ran Out of  Food in the Last Year: Never true Transportation Needs: No Transportation Needs (01/06/2023) PRAPARE - Contractor (Medical): No Lack of Transportation (Non-Medical): No Housing Stability: Low Risk (07/02/2023) Housing Stability Vital Sign Unable to Pay for Housing in the Last Year: No Number of Times Moved in the Last Year: 0 Homeless in the Last Year: No  Family History: Family History Problem Relation Name Age of Onset High blood pressure (Hypertension) Mother [place] [facility] Myocardial Infarction (Heart attack) Mother Rowe Clack Heart disease Father 47 Hole in the aorta valve - born with this Allergies Sister susan martin High blood pressure (Hypertension) Sister Lucrezia Starch Colon polyps Sister Lucrezia Starch Colon polyps Sister lori [place] Asthma Son Madelaine Bhat Baquera Allergies Son Madelaine Bhat Al[place]bach Breast cancer Maternal Aunt Okey Regal iben 45 Her breast cancer was 40 years ago. She is still living.  Review of Systems: A comprehensive 14 point ROS was performed, reviewed, and the pertinent orthopaedic findings are documented in the HPI.  Exam BP 126/82  Ht 157.5 cm (5\' 2" )  Wt 71.7 kg (158 lb)  LMP (LMP Unknown) Comment: Hysterectomy  BMI 28.90 kg/m  General: Well-developed, well-nourished female seen in no acute distress. Antalgic gait without evidence of significant abductor lurch.  HEENT: Atraumatic, normocephalic. Pupils are equal and reactive to light. Extraocular motion is intact. Sclera are clear. Oropharynx is clear with moist mucosa.  Neck: Supple, nontender, and with good ROM. No thyromegaly, adenopathy, JVD, or carotid bruits.  Lungs: Clear to auscultation bilaterally.  Cardiovascular: Regular rate and rhythm. Normal S1, S2. No murmur . No appreciable gallops or rubs. Peripheral pulses are palpable. No lower extremity edema. Homan`s test is negative.  Abdomen: Soft, nontender, nondistended. Bowel sounds  are  present.  Spine: Alignment: No gross scoliosis. Normal lumbar lordosis. Sacroiliac joints: Nontender to palpation Patrick`s test: Negative Flip test: Negative Tenderness: Mild Paraspinous spasm: None Range of motion: Guarded range of motion with both flexion and extension  Extremities: Good strength, stability, and range of motion of the upper extremities. Good range of motion of the knees and ankles.  Right Hip: Pelvic tilt: Negative Limb lengths: Equal with the patient standing Soft tissue swelling: Negative Erythema: Negative Crepitance: Positive Tenderness: Greater trochanter is nontender to palpation. Moderate pain is elicited by axial compression or extremes of rotation. Atrophy: No atrophy. Fair to good hip flexor and abductor strength. Range of Motion: EXT/FLEX: 0/0/90 ADD/ABD: 10/0/10 IR/ER: 10/0/20  Left Hip: Soft tissue swelling: Negative Erythema: Negative Crepitance: Negative Tenderness: Greater trochanter is nontender to palpation. No pain is elicited by axial compression or extremes of rotation. Atrophy: No atrophy. Good hip flexor and abductor strength. Range of Motion: Good active and passive range of motion of the left hip  Vascular: Peripheral pulses are palpable. Good capillary refill. No gross pretibial or ankle edema. Homans test is negative.  Neurologic: Awake, alert, and oriented. Sensory function is intact to pinprick and light touch. Motor strength is judged to be 5/5. Motor coordination is grossly within normal limits. No apparent clonus. No tremor. Deep tendon reflexes are symmetric.  X-rays: Dr. Ernest Pine ordered and interpreted AP pelvis, AP, and lateral radiographs of both hips that were obtained in the office on April 26, 2023. Radiographs of the right hip demonstrate significant narrowing of the cartilage space with bone-on-bone articulation. Subchondral sclerosis is noted. Osteophyte formation is present. Radiographs of the left hip  demonstrate the total hip implants to be in good position. No evidence of loosening or wear. No significant heterotopic ossification. No evidence of fracture or dislocation.  Impression: Degenerative arthrosis of the right hip Left total hip arthroplasty  Plan: The findings were discussed in detail with the patient. She continues to do well with the left hip. The patient was given informational material on total hip replacement. Conservative treatment options for the right hip were reviewed with the patient. We discussed the risks and benefits of surgical intervention. The usual perioperative course was also discussed in detail. The patient expressed understanding of the risks and benefits of surgical intervention and would like to proceed with plans for right total hip arthroplasty in January 2025.  Of note, the patient apparently required Cardiology clearance in the past. She will contact Dr. Darrold Junker about a repeat evaluation/EKG.  I spent a total of 45 minutes in both face-to-face and non-face-to-face activities, excluding procedures performed, for this visit on the date of this encounter.  MEDICAL CLEARANCE: Per anesthesiology and Cardiology (Dr. Darrold Junker) ACTIVITIES: As tolerated. WORK STATUS: Not applicable. THERAPY: Preoperative physical therapy evaluation. MEDICATIONS: Requested Prescriptions  No prescriptions requested or ordered in this encounter  FOLLOW-UP: Return for Postoperative care.  This note will serve as the history and physical for surgery scheduled on July 18, 2023 for a right total hip arthroplasty to be done by Dr. Ernest Pine.  JONATHAN T MUNDY PA-C, M.S.  This note was generated in part with voice recognition software and I apologize for any typographical errors that were not detected and corrected. Electronically signed by Lindsey Bo, PA at 07/04/2023 2:04 PM EST

## 2023-07-13 ENCOUNTER — Encounter
Admission: RE | Admit: 2023-07-13 | Discharge: 2023-07-13 | Disposition: A | Discharge status: Home / Self Care | Payer: Medicare Other | Source: Ambulatory Visit | Attending: Orthopedic Surgery | Admitting: Orthopedic Surgery

## 2023-07-13 ENCOUNTER — Other Ambulatory Visit: Payer: Self-pay

## 2023-07-13 VITALS — BP 160/72 | HR 65 | Temp 97.7°F | Resp 20 | Ht 61.0 in | Wt 158.0 lb

## 2023-07-13 DIAGNOSIS — M1611 Unilateral primary osteoarthritis, right hip: Secondary | ICD-10-CM | POA: Insufficient documentation

## 2023-07-13 DIAGNOSIS — M1612 Unilateral primary osteoarthritis, left hip: Secondary | ICD-10-CM

## 2023-07-13 DIAGNOSIS — K635 Polyp of colon: Secondary | ICD-10-CM | POA: Insufficient documentation

## 2023-07-13 DIAGNOSIS — L309 Dermatitis, unspecified: Secondary | ICD-10-CM | POA: Insufficient documentation

## 2023-07-13 DIAGNOSIS — M5440 Lumbago with sciatica, unspecified side: Secondary | ICD-10-CM | POA: Diagnosis not present

## 2023-07-13 DIAGNOSIS — Z96641 Presence of right artificial hip joint: Secondary | ICD-10-CM | POA: Insufficient documentation

## 2023-07-13 DIAGNOSIS — Z01818 Encounter for other preprocedural examination: Secondary | ICD-10-CM | POA: Diagnosis present

## 2023-07-13 DIAGNOSIS — Z01812 Encounter for preprocedural laboratory examination: Secondary | ICD-10-CM | POA: Insufficient documentation

## 2023-07-13 DIAGNOSIS — M85851 Other specified disorders of bone density and structure, right thigh: Secondary | ICD-10-CM | POA: Diagnosis not present

## 2023-07-13 HISTORY — DX: Pure hypercholesterolemia, unspecified: E78.00

## 2023-07-13 HISTORY — DX: Hyperlipidemia, unspecified: E78.5

## 2023-07-13 HISTORY — DX: Unspecified osteoarthritis, unspecified site: M19.90

## 2023-07-13 LAB — COMPREHENSIVE METABOLIC PANEL
ALT: 20 U/L (ref 0–44)
AST: 23 U/L (ref 15–41)
Albumin: 4.2 g/dL (ref 3.5–5.0)
Alkaline Phosphatase: 59 U/L (ref 38–126)
Anion gap: 9 (ref 5–15)
BUN: 26 mg/dL — ABNORMAL HIGH (ref 8–23)
CO2: 26 mmol/L (ref 22–32)
Calcium: 10.1 mg/dL (ref 8.9–10.3)
Chloride: 102 mmol/L (ref 98–111)
Creatinine, Ser: 1.07 mg/dL — ABNORMAL HIGH (ref 0.44–1.00)
GFR, Estimated: 53 mL/min — ABNORMAL LOW (ref >60)
Glucose, Bld: 85 mg/dL (ref 70–99)
Potassium: 4.1 mmol/L (ref 3.5–5.1)
Sodium: 137 mmol/L (ref 135–145)
Total Bilirubin: 0.7 mg/dL (ref 0.0–1.2)
Total Protein: 7.1 g/dL (ref 6.5–8.1)

## 2023-07-13 LAB — URINALYSIS, ROUTINE W REFLEX MICROSCOPIC
Bilirubin Urine: NEGATIVE
Glucose, UA: NEGATIVE mg/dL
Hgb urine dipstick: NEGATIVE
Ketones, ur: NEGATIVE mg/dL
Leukocytes,Ua: NEGATIVE
Nitrite: NEGATIVE
Protein, ur: NEGATIVE mg/dL
Specific Gravity, Urine: 1.01 (ref 1.005–1.030)
pH: 5 (ref 5.0–8.0)

## 2023-07-13 LAB — TYPE AND SCREEN
ABO/RH(D): O POS
Antibody Screen: NEGATIVE

## 2023-07-13 LAB — C-REACTIVE PROTEIN: CRP: 0.6 mg/dL (ref <1.0)

## 2023-07-13 LAB — SURGICAL PCR SCREEN
MRSA, PCR: NEGATIVE
Staphylococcus aureus: NEGATIVE

## 2023-07-13 LAB — HEMOGLOBIN AND HEMATOCRIT, BLOOD
HCT: 43.9 % (ref 36.0–46.0)
Hemoglobin: 14.4 g/dL (ref 12.0–15.0)

## 2023-07-13 LAB — SEDIMENTATION RATE: Sed Rate: 9 mm/h (ref 0–30)

## 2023-07-13 NOTE — Patient Instructions (Addendum)
Your procedure is scheduled on: Jan. 27/ 2025 Monday  Report to the Registration Desk on the 1st floor of the [facility] To find out your arrival time, please call [phone] between 1PM - 3PM on: Jan 24 Friday If your arrival time is 6:00 am, do not arrive before that time as the [facility] entrance doors do not open until 6:00 am.  REMEMBER: Instructions that are not followed completely may result in serious medical risk, up to and including death; or upon the discretion of your surgeon and anesthesiologist your surgery may need to be rescheduled.  Do not eat food after midnight the night before surgery.  No gum chewing or hard candies.  You may however, drink CLEAR liquids up to 2 hours before you are scheduled to arrive for your surgery. Do not drink anything within 2 hours of your scheduled arrival time.  Clear liquids include: - water  - apple juice without pulp - gatorade (not RED colors) - black coffee or tea (Do NOT add milk or creamers to the coffee or tea) Do NOT drink anything that is not on this list.  In addition, your doctor has ordered for you to drink the provided:  Ensure Pre-Surgery Clear Carbohydrate Drink   Drinking this carbohydrate drink up to two hours before surgery helps to reduce insulin resistance and improve patient outcomes. Please complete drinking 2 hours before scheduled arrival time.  One week prior to surgery: Stop Anti-inflammatories (NSAIDS) such as Advil, Aleve, Ibuprofen, Motrin, Naproxen, Naprosyn and Aspirin based products such as Excedrin, Goody's Powder, BC Powder meloxicam  Stop ANY OVER THE COUNTER supplements until after surgery. biotin, Multiple Vitamin, calcium  You may however, continue to take Tylenol if needed for pain up until the day of surgery.   Continue taking all of your other prescription medications up until the day of surgery.  ON THE DAY OF SURGERY ONLY TAKE THESE MEDICATIONS WITH SIPS OF WATER:  gabapentin  (NEURONTIN)      No Alcohol for 24 hours before or after surgery.  No Smoking including e-cigarettes for 24 hours before surgery.  No chewable tobacco products for at least 6 hours before surgery.  No nicotine patches on the day of surgery.  Do not use any "recreational" drugs for at least a week (preferably 2 weeks) before your surgery.  Please be advised that the combination of cocaine and anesthesia may have negative outcomes, up to and including death. If you test positive for cocaine, your surgery will be cancelled.  On the morning of surgery brush your teeth with toothpaste and water, you may rinse your mouth with mouthwash if you wish. Do not swallow any toothpaste or mouthwash.  Use CHG Soap or wipes as directed on instruction sheet.-provided for you  Do not wear jewelry, make-up, hairpins, clips or nail polish.    Do not wear lotions, powders, or perfumes.   Do not shave body hair from the neck down 48 hours before surgery.  Contact lenses, hearing aids and dentures may not be worn into surgery.  Do not bring valuables to the hospital. [facility] is not responsible for any missing/lost belongings or valuables.     Notify your doctor if there is any change in your medical condition (cold, fever, infection).  Wear comfortable clothing (specific to your surgery type) to the hospital.  After surgery, you can help prevent lung complications by doing breathing exercises.  Take deep breaths and cough every 1-2 hours. Your doctor may order  a device called an Incentive Spirometer to help you take deep breaths.  If you are being admitted to the hospital overnight, leave your suitcase in the car. After surgery it may be brought to your room.  In case of increased patient census, it may be necessary for you, the patient, to continue your postoperative care in the Same Day Surgery department.   Please call the Pre-admissions Testing Dept. at [phone] if you have any  questions about these instructions.  Surgery Visitation Policy:  Patients having surgery or a procedure may have two visitors.  Children under the age of 51 must have an adult with them who is not the patient.  Temporary Visitor Restrictions Due to increasing cases of flu, RSV and COVID-19: Children ages 4 and under will not be able to visit patients in [facility] hospitals under most circumstances.  Inpatient Visitation:    Visiting hours are 7 a.m. to 8 p.m. Up to four visitors are allowed at one time in a patient room. The visitors may rotate out with other people during the day.  One visitor age 36 or older may stay with the patient overnight and must be in the room by 8 p.m.     Pre-operative 5 CHG Bath Instructions   You can play a key role in reducing the risk of infection after surgery. Your skin needs to be as free of germs as possible. You can reduce the number of germs on your skin by washing with CHG (chlorhexidine gluconate) soap before surgery. CHG is an antiseptic soap that kills germs and continues to kill germs even after washing.   DO NOT use if you have an allergy to chlorhexidine/CHG or antibacterial soaps. If your skin becomes reddened or irritated, stop using the CHG and notify one of our RNs at [phone].   Please shower with the CHG soap starting 4 days before surgery using the following schedule:          Please keep in mind the following:  DO NOT shave, including legs and underarms, starting the day of your first shower.   You may shave your face at any point before/day of surgery.  Place clean sheets on your bed the day you start using CHG soap. Use a clean washcloth (not used since being washed) for each shower. DO NOT sleep with pets once you start using the CHG.   CHG Shower Instructions:  If you choose to wash your hair and private area, wash first with your normal shampoo/soap.  After you use shampoo/soap, rinse your hair and body  thoroughly to remove shampoo/soap residue.  Turn the water OFF and apply about 3 tablespoons (45 ml) of CHG soap to a CLEAN washcloth.  Apply CHG soap ONLY FROM YOUR NECK DOWN TO YOUR TOES (washing for 3-5 minutes)  DO NOT use CHG soap on face, private areas, open wounds, or sores.  Pay special attention to the area where your surgery is being performed.  If you are having back surgery, having someone wash your back for you may be helpful. Wait 2 minutes after CHG soap is applied, then you may rinse off the CHG soap.  Pat dry with a clean towel  Put on clean clothes/pajamas   If you choose to wear lotion, please use ONLY the CHG-compatible lotions on the back of this paper.     Additional instructions for the day of surgery: DO NOT APPLY any lotions, deodorants, cologne, or perfumes.   Put on clean/comfortable  clothes.  Brush your teeth.  Ask your nurse before applying any prescription medications to the skin.      CHG Compatible Lotions   Aveeno Moisturizing lotion  Cetaphil Moisturizing Cream  Cetaphil Moisturizing Lotion  Clairol Herbal Essence Moisturizing Lotion, Dry Skin  Clairol Herbal Essence Moisturizing Lotion, Extra Dry Skin  Clairol Herbal Essence Moisturizing Lotion, Normal Skin  Curel Age Defying Therapeutic Moisturizing Lotion with Alpha Hydroxy  Curel Extreme Care Body Lotion  Curel Soothing Hands Moisturizing Hand Lotion  Curel Therapeutic Moisturizing Cream, Fragrance-Free  Curel Therapeutic Moisturizing Lotion, Fragrance-Free  Curel Therapeutic Moisturizing Lotion, Original Formula  Eucerin Daily Replenishing Lotion  Eucerin Dry Skin Therapy Plus Alpha Hydroxy Crme  Eucerin Dry Skin Therapy Plus Alpha Hydroxy Lotion  Eucerin Original Crme  Eucerin Original Lotion  Eucerin Plus Crme Eucerin Plus Lotion  Eucerin TriLipid Replenishing Lotion  Keri Anti-Bacterial Hand Lotion  Keri Deep Conditioning Original Lotion Dry Skin Formula Softly Scented  Keri  Deep Conditioning Original Lotion, Fragrance Free Sensitive Skin Formula  Keri Lotion Fast Absorbing Fragrance Free Sensitive Skin Formula  Keri Lotion Fast Absorbing Softly Scented Dry Skin Formula  Keri Original Lotion  Keri Skin Renewal Lotion Keri Silky Smooth Lotion  Keri Silky Smooth Sensitive Skin Lotion  Nivea Body Creamy Conditioning Oil  Nivea Body Extra Enriched Lotion  Nivea Body Original Lotion  Nivea Body Sheer Moisturizing Lotion Nivea Crme  Nivea Skin Firming Lotion  NutraDerm 30 Skin Lotion  NutraDerm Skin Lotion  NutraDerm Therapeutic Skin Cream  NutraDerm Therapeutic Skin Lotion  ProShield Protective Hand Cream  Provon moisturizing lotion     Preoperative Educational Videos for Total Hip, Knee and Shoulder Replacements  To better prepare for surgery, please view our videos that explain the physical activity and discharge planning required to have the best surgical recovery at [facility].  [email or web address]  Questions? Call [phone] or email [email or web address]      How to Use an Incentive Spirometer  An incentive spirometer is a tool that measures how well you are filling your lungs with each breath. Learning to take long, deep breaths using this tool can help you keep your lungs clear and active. This may help to reverse or lessen your chance of developing breathing (pulmonary) problems, especially infection. You may be asked to use a spirometer: After a surgery. If you have a lung problem or a history of smoking. After a long period of time when you have been unable to move or be active. If the spirometer includes an indicator to show the highest number that you have reached, your health care provider or respiratory therapist will help you set a goal. Keep a log of your progress as told by your health care provider. What are the risks? Breathing too quickly  may cause dizziness or cause you to pass out. Take your time so you do not get dizzy or light-headed. If you are in pain, you may need to take pain medicine before doing incentive spirometry. It is harder to take a deep breath if you are having pain. How to use your incentive spirometer  Sit up on the edge of your bed or on a chair. Hold the incentive spirometer so that it is in an upright position. Before you use the spirometer, breathe out normally. Place the mouthpiece in your mouth. Make sure your lips are closed tightly around it. Breathe in slowly and as deeply as you can through your mouth, causing the piston or the ball to  rise toward the top of the chamber. Hold your breath for 3-5 seconds, or for as long as possible. If the spirometer includes a coach indicator, use this to guide you in breathing. Slow down your breathing if the indicator goes above the marked areas. Remove the mouthpiece from your mouth and breathe out normally. The piston or ball will return to the bottom of the chamber. Rest for a few seconds, then repeat the steps 10 or more times. Take your time and take a few normal breaths between deep breaths so that you do not get dizzy or light-headed. Do this every 1-2 hours when you are awake. If the spirometer includes a goal marker to show the highest number you have reached (best effort), use this as a goal to work toward during each repetition. After each set of 10 deep breaths, cough a few times. This will help to make sure that your lungs are clear. If you have an incision on your chest or abdomen from surgery, place a pillow or a rolled-up towel firmly against the incision when you cough. This can help to reduce pain while taking deep breaths and coughing. General tips When you are able to get out of bed: Walk around often. Continue to take deep breaths and cough in order to clear your lungs. Keep using the incentive spirometer until your health care provider says  it is okay to stop using it. If you have been in the hospital, you may be told to keep using the spirometer at home. Contact a health care provider if: You are having difficulty using the spirometer. You have trouble using the spirometer as often as instructed. Your pain medicine is not giving enough relief for you to use the spirometer as told. You have a fever. Get help right away if: You develop shortness of breath. You develop a cough with bloody mucus from the lungs. You have fluid or blood coming from an incision site after you cough. Summary An incentive spirometer is a tool that can help you learn to take long, deep breaths to keep your lungs clear and active. You may be asked to use a spirometer after a surgery, if you have a lung problem or a history of smoking, or if you have been inactive for a long period of time. Use your incentive spirometer as instructed every 1-2 hours while you are awake. If you have an incision on your chest or abdomen, place a pillow or a rolled-up towel firmly against your incision when you cough. This will help to reduce pain. Get help right away if you have shortness of breath, you cough up bloody mucus, or blood comes from your incision when you cough. This information is not intended to replace advice given to you by your health care provider. Make sure you discuss any questions you have with your health care provider. Document Revised: 08/27/2019 Document Reviewed: 08/27/2019 Elsevier Patient Education  2023 [employer].

## 2023-07-17 MED ORDER — CHLORHEXIDINE GLUCONATE 4 % EX SOLN: 60.0000 mL | Freq: Once | CUTANEOUS | Status: DC

## 2023-07-17 MED ORDER — CEFAZOLIN SODIUM-DEXTROSE 2-4 GM/100ML-% IV SOLN
2.0000 g | INTRAVENOUS | Status: AC
  Administered 2023-07-18: 2 g via INTRAVENOUS

## 2023-07-17 MED ORDER — DEXAMETHASONE SODIUM PHOSPHATE 10 MG/ML IJ SOLN
8.0000 mg | Freq: Once | INTRAMUSCULAR | Status: AC
  Administered 2023-07-18: 8 mg via INTRAVENOUS

## 2023-07-17 MED ORDER — ORAL CARE MOUTH RINSE
15.0000 mL | Freq: Once | OROMUCOSAL | Status: AC
Start: 2023-07-17 — End: 2023-07-18

## 2023-07-17 MED ORDER — CHLORHEXIDINE GLUCONATE 0.12 % MT SOLN
15.0000 mL | Freq: Once | OROMUCOSAL | Status: AC
  Administered 2023-07-18: 15 mL via OROMUCOSAL

## 2023-07-17 MED ORDER — TRANEXAMIC ACID-NACL 1000-0.7 MG/100ML-% IV SOLN
1000.0000 mg | INTRAVENOUS | Status: AC
  Administered 2023-07-18: 1000 mg via INTRAVENOUS

## 2023-07-17 MED ORDER — LACTATED RINGERS IV SOLN
INTRAVENOUS | Status: DC
Start: 2023-07-17 — End: 2023-07-18

## 2023-07-17 MED ORDER — GABAPENTIN 300 MG PO CAPS
300.0000 mg | ORAL_CAPSULE | Freq: Once | ORAL | Status: AC
  Administered 2023-07-18: 300 mg via ORAL

## 2023-07-18 ENCOUNTER — Other Ambulatory Visit: Payer: Self-pay

## 2023-07-18 ENCOUNTER — Encounter
Admission: RE | Disposition: A | Discharge status: Home / Self Care | Payer: Self-pay | Source: Home / Self Care | Attending: Orthopedic Surgery

## 2023-07-18 ENCOUNTER — Encounter: Payer: Self-pay | Admitting: Orthopedic Surgery

## 2023-07-18 ENCOUNTER — Observation Stay
Admission: RE | Admit: 2023-07-18 | Discharge: 2023-07-19 | Disposition: A | Discharge status: Home / Self Care | Payer: Medicare Other | Attending: Orthopedic Surgery | Admitting: Orthopedic Surgery

## 2023-07-18 ENCOUNTER — Ambulatory Visit: Payer: Medicare Other | Admitting: Urgent Care

## 2023-07-18 ENCOUNTER — Observation Stay: Payer: Medicare Other

## 2023-07-18 ENCOUNTER — Ambulatory Visit: Payer: Self-pay | Admitting: Certified Registered"

## 2023-07-18 DIAGNOSIS — Z96642 Presence of left artificial hip joint: Secondary | ICD-10-CM | POA: Insufficient documentation

## 2023-07-18 DIAGNOSIS — M85851 Other specified disorders of bone density and structure, right thigh: Secondary | ICD-10-CM

## 2023-07-18 DIAGNOSIS — Z96641 Presence of right artificial hip joint: Secondary | ICD-10-CM

## 2023-07-18 DIAGNOSIS — K635 Polyp of colon: Secondary | ICD-10-CM

## 2023-07-18 DIAGNOSIS — M5440 Lumbago with sciatica, unspecified side: Secondary | ICD-10-CM

## 2023-07-18 DIAGNOSIS — M1611 Unilateral primary osteoarthritis, right hip: Principal | ICD-10-CM | POA: Insufficient documentation

## 2023-07-18 DIAGNOSIS — M1612 Unilateral primary osteoarthritis, left hip: Secondary | ICD-10-CM

## 2023-07-18 DIAGNOSIS — L309 Dermatitis, unspecified: Secondary | ICD-10-CM

## 2023-07-18 DIAGNOSIS — Z01812 Encounter for preprocedural laboratory examination: Secondary | ICD-10-CM

## 2023-07-18 HISTORY — PX: TOTAL HIP ARTHROPLASTY: SHX124

## 2023-07-18 SURGERY — ARTHROPLASTY, HIP, TOTAL,POSTERIOR APPROACH
Anesthesia: Spinal | Site: Hip | Laterality: Right

## 2023-07-18 MED ORDER — PROPOFOL 1000 MG/100ML IV EMUL
INTRAVENOUS | Status: AC
  Filled 2023-07-18: qty 100

## 2023-07-18 MED ORDER — FENTANYL CITRATE (PF) 100 MCG/2ML IJ SOLN
INTRAMUSCULAR | Status: AC
  Filled 2023-07-18: qty 2

## 2023-07-18 MED ORDER — PROPOFOL 10 MG/ML IV BOLUS
INTRAVENOUS | Status: DC | PRN
  Administered 2023-07-18: 100 mg via INTRAVENOUS

## 2023-07-18 MED ORDER — ACETAMINOPHEN 10 MG/ML IV SOLN
1000.0000 mg | Freq: Four times a day (QID) | INTRAVENOUS | Status: DC
  Administered 2023-07-18 – 2023-07-19 (×3): 1000 mg via INTRAVENOUS

## 2023-07-18 MED ORDER — ONDANSETRON HCL 4 MG/2ML IJ SOLN
INTRAMUSCULAR | Status: AC
  Filled 2023-07-18: qty 2

## 2023-07-18 MED ORDER — ALBUMIN HUMAN 5 % IV SOLN
INTRAVENOUS | Status: AC
  Filled 2023-07-18: qty 250

## 2023-07-18 MED ORDER — ACETAMINOPHEN 325 MG PO TABS: 325.0000 mg | ORAL_TABLET | Freq: Four times a day (QID) | ORAL | Status: DC | PRN

## 2023-07-18 MED ORDER — MAGNESIUM HYDROXIDE 400 MG/5ML PO SUSP
30.0000 mL | Freq: Every day | ORAL | Status: DC
  Administered 2023-07-18 – 2023-07-19 (×2): 30 mL via ORAL

## 2023-07-18 MED ORDER — GABAPENTIN 100 MG PO CAPS
100.0000 mg | ORAL_CAPSULE | Freq: Two times a day (BID) | ORAL | Status: DC
  Administered 2023-07-18 – 2023-07-19 (×2): 100 mg via ORAL

## 2023-07-18 MED ORDER — KETAMINE HCL 50 MG/5ML IJ SOSY
PREFILLED_SYRINGE | INTRAMUSCULAR | Status: AC
  Filled 2023-07-18: qty 5

## 2023-07-18 MED ORDER — PHENYLEPHRINE HCL-NACL 20-0.9 MG/250ML-% IV SOLN
INTRAVENOUS | Status: AC
  Filled 2023-07-18: qty 250

## 2023-07-18 MED ORDER — PANTOPRAZOLE SODIUM 40 MG PO TBEC
DELAYED_RELEASE_TABLET | ORAL | Status: AC
Start: 2023-07-18 — End: ?
  Filled 2023-07-18: qty 1

## 2023-07-18 MED ORDER — CELECOXIB 200 MG PO CAPS
ORAL_CAPSULE | ORAL | Status: AC
  Filled 2023-07-18: qty 2

## 2023-07-18 MED ORDER — CEFAZOLIN SODIUM-DEXTROSE 2-4 GM/100ML-% IV SOLN
2.0000 g | Freq: Four times a day (QID) | INTRAVENOUS | Status: AC
  Administered 2023-07-18 (×2): 2 g via INTRAVENOUS

## 2023-07-18 MED ORDER — CELECOXIB 200 MG PO CAPS
200.0000 mg | ORAL_CAPSULE | Freq: Two times a day (BID) | ORAL | Status: DC
  Administered 2023-07-18 – 2023-07-19 (×2): 200 mg via ORAL

## 2023-07-18 MED ORDER — METOCLOPRAMIDE HCL 10 MG PO TABS
10.0000 mg | ORAL_TABLET | Freq: Three times a day (TID) | ORAL | Status: DC
  Administered 2023-07-18 – 2023-07-19 (×3): 10 mg via ORAL

## 2023-07-18 MED ORDER — GABAPENTIN 100 MG PO CAPS
ORAL_CAPSULE | ORAL | Status: AC
Start: 2023-07-18 — End: ?
  Filled 2023-07-18: qty 1

## 2023-07-18 MED ORDER — OXYCODONE HCL 5 MG PO TABS
10.0000 mg | ORAL_TABLET | ORAL | Status: DC | PRN
Start: 2023-07-18 — End: 2023-07-19
  Administered 2023-07-18: 10 mg via ORAL

## 2023-07-18 MED ORDER — SUGAMMADEX SODIUM 200 MG/2ML IV SOLN
INTRAVENOUS | Status: DC | PRN
  Administered 2023-07-18: 150 mg via INTRAVENOUS

## 2023-07-18 MED ORDER — DIPHENHYDRAMINE HCL 12.5 MG/5ML PO ELIX: 12.5000 mg | ORAL_SOLUTION | ORAL | Status: DC | PRN

## 2023-07-18 MED ORDER — ONDANSETRON HCL 4 MG/2ML IJ SOLN
INTRAMUSCULAR | Status: DC | PRN
  Administered 2023-07-18: 4 mg via INTRAVENOUS

## 2023-07-18 MED ORDER — ACETAMINOPHEN 10 MG/ML IV SOLN
INTRAVENOUS | Status: AC
  Filled 2023-07-18: qty 100

## 2023-07-18 MED ORDER — BUPIVACAINE HCL (PF) 0.5 % IJ SOLN
INTRAMUSCULAR | Status: AC
  Filled 2023-07-18: qty 30

## 2023-07-18 MED ORDER — OXYMETAZOLINE HCL 0.05 % NA SOLN
NASAL | Status: AC
Start: 2023-07-18 — End: ?
  Filled 2023-07-18: qty 15

## 2023-07-18 MED ORDER — ASPIRIN 81 MG PO CHEW
81.0000 mg | CHEWABLE_TABLET | Freq: Two times a day (BID) | ORAL | Status: DC
Start: 2023-07-18 — End: 2023-07-19
  Administered 2023-07-18 – 2023-07-19 (×2): 81 mg via ORAL

## 2023-07-18 MED ORDER — FLEET ENEMA RE ENEM: 1.0000 | ENEMA | Freq: Once | RECTAL | Status: DC | PRN

## 2023-07-18 MED ORDER — BISACODYL 10 MG RE SUPP: 10.0000 mg | Freq: Every day | RECTAL | Status: DC | PRN

## 2023-07-18 MED ORDER — MENTHOL 3 MG MT LOZG
1.0000 | LOZENGE | OROMUCOSAL | Status: DC | PRN
  Filled 2023-07-18: qty 9

## 2023-07-18 MED ORDER — FENTANYL CITRATE (PF) 100 MCG/2ML IJ SOLN
25.0000 ug | INTRAMUSCULAR | Status: DC | PRN
  Administered 2023-07-18 (×2): 50 ug via INTRAVENOUS

## 2023-07-18 MED ORDER — TRANEXAMIC ACID-NACL 1000-0.7 MG/100ML-% IV SOLN
INTRAVENOUS | Status: AC
  Filled 2023-07-18: qty 100

## 2023-07-18 MED ORDER — CEFAZOLIN SODIUM-DEXTROSE 2-4 GM/100ML-% IV SOLN
INTRAVENOUS | Status: AC
  Filled 2023-07-18: qty 100

## 2023-07-18 MED ORDER — BUPIVACAINE HCL (PF) 0.5 % IJ SOLN
INTRAMUSCULAR | Status: AC
  Filled 2023-07-18: qty 10

## 2023-07-18 MED ORDER — EPHEDRINE 5 MG/ML INJ
INTRAVENOUS | Status: AC
  Filled 2023-07-18: qty 5

## 2023-07-18 MED ORDER — TRAMADOL HCL 50 MG PO TABS
ORAL_TABLET | ORAL | Status: AC
  Filled 2023-07-18: qty 1

## 2023-07-18 MED ORDER — ROCURONIUM BROMIDE 10 MG/ML (PF) SYRINGE
PREFILLED_SYRINGE | INTRAVENOUS | Status: AC
  Filled 2023-07-18: qty 10

## 2023-07-18 MED ORDER — BUPIVACAINE LIPOSOME 1.3 % IJ SUSP
INTRAMUSCULAR | Status: AC
  Filled 2023-07-18: qty 20

## 2023-07-18 MED ORDER — HYDROMORPHONE HCL 1 MG/ML IJ SOLN: 0.5000 mg | INTRAMUSCULAR | Status: DC | PRN

## 2023-07-18 MED ORDER — FERROUS SULFATE 325 (65 FE) MG PO TABS
ORAL_TABLET | ORAL | Status: AC
  Filled 2023-07-18: qty 1

## 2023-07-18 MED ORDER — OXYMETAZOLINE HCL 0.05 % NA SOLN
1.0000 | Freq: Every day | NASAL | Status: DC
  Administered 2023-07-18: 1 via NASAL

## 2023-07-18 MED ORDER — PANTOPRAZOLE SODIUM 40 MG PO TBEC
40.0000 mg | DELAYED_RELEASE_TABLET | Freq: Two times a day (BID) | ORAL | Status: DC
  Administered 2023-07-18 – 2023-07-19 (×3): 40 mg via ORAL

## 2023-07-18 MED ORDER — OXYCODONE HCL 5 MG/5ML PO SOLN: 5.0000 mg | Freq: Once | ORAL | Status: DC | PRN

## 2023-07-18 MED ORDER — METOCLOPRAMIDE HCL 10 MG PO TABS
ORAL_TABLET | ORAL | Status: AC
  Filled 2023-07-18: qty 1

## 2023-07-18 MED ORDER — PHENYLEPHRINE HCL-NACL 20-0.9 MG/250ML-% IV SOLN
INTRAVENOUS | Status: DC | PRN
  Administered 2023-07-18: 25 ug/min via INTRAVENOUS

## 2023-07-18 MED ORDER — ONDANSETRON HCL 4 MG PO TABS
4.0000 mg | ORAL_TABLET | Freq: Four times a day (QID) | ORAL | Status: DC | PRN
Start: 2023-07-18 — End: 2023-07-19
  Administered 2023-07-18: 4 mg via ORAL

## 2023-07-18 MED ORDER — SURGIRINSE WOUND IRRIGATION SYSTEM - OPTIME
TOPICAL | Status: DC | PRN
  Administered 2023-07-18: 450 mL via TOPICAL

## 2023-07-18 MED ORDER — TIZANIDINE HCL 4 MG PO TABS
ORAL_TABLET | ORAL | Status: AC
  Filled 2023-07-18: qty 1

## 2023-07-18 MED ORDER — ONDANSETRON HCL 4 MG PO TABS
ORAL_TABLET | ORAL | Status: AC
Start: 2023-07-18 — End: ?
  Filled 2023-07-18: qty 1

## 2023-07-18 MED ORDER — OXYCODONE HCL 5 MG PO TABS
ORAL_TABLET | ORAL | Status: AC
  Filled 2023-07-18: qty 1

## 2023-07-18 MED ORDER — SODIUM CHLORIDE 0.9 % IV SOLN
INTRAVENOUS | Status: DC
Start: 2023-07-18 — End: 2023-07-19

## 2023-07-18 MED ORDER — CEFAZOLIN SODIUM-DEXTROSE 2-4 GM/100ML-% IV SOLN
INTRAVENOUS | Status: AC
Start: 2023-07-18 — End: ?
  Filled 2023-07-18: qty 100

## 2023-07-18 MED ORDER — FENTANYL CITRATE (PF) 100 MCG/2ML IJ SOLN
INTRAMUSCULAR | Status: DC | PRN
  Administered 2023-07-18 (×4): 50 ug via INTRAVENOUS

## 2023-07-18 MED ORDER — PROPOFOL 10 MG/ML IV BOLUS
INTRAVENOUS | Status: AC
  Filled 2023-07-18: qty 20

## 2023-07-18 MED ORDER — PANTOPRAZOLE SODIUM 40 MG PO TBEC
DELAYED_RELEASE_TABLET | ORAL | Status: AC
  Filled 2023-07-18: qty 1

## 2023-07-18 MED ORDER — OXYCODONE HCL 5 MG PO TABS
5.0000 mg | ORAL_TABLET | ORAL | Status: DC | PRN
  Administered 2023-07-18 – 2023-07-19 (×2): 5 mg via ORAL

## 2023-07-18 MED ORDER — TRANEXAMIC ACID-NACL 1000-0.7 MG/100ML-% IV SOLN
1000.0000 mg | Freq: Once | INTRAVENOUS | Status: AC
Start: 2023-07-18 — End: 2023-07-18
  Administered 2023-07-18: 1000 mg via INTRAVENOUS

## 2023-07-18 MED ORDER — CELECOXIB 200 MG PO CAPS
ORAL_CAPSULE | ORAL | Status: AC
  Filled 2023-07-18: qty 1

## 2023-07-18 MED ORDER — ROSUVASTATIN CALCIUM 10 MG PO TABS
ORAL_TABLET | ORAL | Status: AC
  Filled 2023-07-18: qty 1

## 2023-07-18 MED ORDER — ASPIRIN 81 MG PO CHEW
CHEWABLE_TABLET | ORAL | Status: AC
  Filled 2023-07-18: qty 1

## 2023-07-18 MED ORDER — OXYCODONE HCL 5 MG PO TABS
5.0000 mg | ORAL_TABLET | Freq: Once | ORAL | Status: DC | PRN
Start: 2023-07-18 — End: 2023-07-18

## 2023-07-18 MED ORDER — TRAMADOL HCL 50 MG PO TABS
50.0000 mg | ORAL_TABLET | ORAL | Status: DC | PRN
Start: 2023-07-18 — End: 2023-07-19
  Administered 2023-07-18: 50 mg via ORAL

## 2023-07-18 MED ORDER — SENNOSIDES-DOCUSATE SODIUM 8.6-50 MG PO TABS
ORAL_TABLET | ORAL | Status: AC
Start: 2023-07-18 — End: ?
  Filled 2023-07-18: qty 1

## 2023-07-18 MED ORDER — LIDOCAINE HCL (PF) 2 % IJ SOLN
INTRAMUSCULAR | Status: AC
  Filled 2023-07-18: qty 5

## 2023-07-18 MED ORDER — FERROUS SULFATE 325 (65 FE) MG PO TABS
325.0000 mg | ORAL_TABLET | Freq: Two times a day (BID) | ORAL | Status: DC
  Administered 2023-07-18 – 2023-07-19 (×2): 325 mg via ORAL

## 2023-07-18 MED ORDER — ONDANSETRON HCL 4 MG/2ML IJ SOLN
4.0000 mg | Freq: Four times a day (QID) | INTRAMUSCULAR | Status: DC | PRN
  Administered 2023-07-18: 4 mg via INTRAVENOUS

## 2023-07-18 MED ORDER — FLUTICASONE PROPIONATE 50 MCG/ACT NA SUSP: 1.0000 | Freq: Every evening | NASAL | Status: DC | PRN

## 2023-07-18 MED ORDER — BUPIVACAINE HCL (PF) 0.5 % IJ SOLN
INTRAMUSCULAR | Status: AC
  Filled 2023-07-18: qty 40

## 2023-07-18 MED ORDER — KETAMINE HCL 50 MG/5ML IJ SOSY
PREFILLED_SYRINGE | INTRAMUSCULAR | Status: DC | PRN
  Administered 2023-07-18: 10 mg via INTRAVENOUS
  Administered 2023-07-18: 20 mg via INTRAVENOUS

## 2023-07-18 MED ORDER — MAGNESIUM HYDROXIDE 400 MG/5ML PO SUSP
ORAL | Status: AC
  Filled 2023-07-18: qty 30

## 2023-07-18 MED ORDER — SENNOSIDES-DOCUSATE SODIUM 8.6-50 MG PO TABS
1.0000 | ORAL_TABLET | Freq: Two times a day (BID) | ORAL | Status: DC
  Administered 2023-07-18 – 2023-07-19 (×3): 1 via ORAL

## 2023-07-18 MED ORDER — OXYCODONE HCL 5 MG PO TABS
ORAL_TABLET | ORAL | Status: AC
  Filled 2023-07-18: qty 2

## 2023-07-18 MED ORDER — ENSURE PRE-SURGERY PO LIQD
296.0000 mL | Freq: Once | ORAL | Status: DC
  Administered 2023-07-18: 296 mL via ORAL
  Filled 2023-07-18: qty 296

## 2023-07-18 MED ORDER — TIZANIDINE HCL 4 MG PO TABS
4.0000 mg | ORAL_TABLET | Freq: Every day | ORAL | Status: DC
  Administered 2023-07-18: 4 mg via ORAL

## 2023-07-18 MED ORDER — DROPERIDOL 2.5 MG/ML IJ SOLN
0.6250 mg | Freq: Once | INTRAMUSCULAR | Status: DC | PRN
Start: 2023-07-18 — End: 2023-07-18

## 2023-07-18 MED ORDER — ACETAMINOPHEN 10 MG/ML IV SOLN
INTRAVENOUS | Status: DC | PRN
  Administered 2023-07-18: 1000 mg via INTRAVENOUS

## 2023-07-18 MED ORDER — FENTANYL CITRATE PF 50 MCG/ML IJ SOSY
PREFILLED_SYRINGE | INTRAMUSCULAR | Status: AC
  Filled 2023-07-18: qty 1

## 2023-07-18 MED ORDER — SENNOSIDES-DOCUSATE SODIUM 8.6-50 MG PO TABS
ORAL_TABLET | ORAL | Status: AC
  Filled 2023-07-18: qty 1

## 2023-07-18 MED ORDER — HYDROMORPHONE HCL 1 MG/ML IJ SOLN
INTRAMUSCULAR | Status: AC
  Filled 2023-07-18: qty 1

## 2023-07-18 MED ORDER — ALUM & MAG HYDROXIDE-SIMETH 200-200-20 MG/5ML PO SUSP: 30.0000 mL | ORAL | Status: DC | PRN

## 2023-07-18 MED ORDER — 0.9 % SODIUM CHLORIDE (POUR BTL) OPTIME
TOPICAL | Status: DC | PRN
  Administered 2023-07-18: 500 mL

## 2023-07-18 MED ORDER — HYDROMORPHONE HCL 1 MG/ML IJ SOLN
INTRAMUSCULAR | Status: DC | PRN
Start: 2023-07-18 — End: 2023-07-18
  Administered 2023-07-18: .5 mg via INTRAVENOUS

## 2023-07-18 MED ORDER — SODIUM CHLORIDE 0.9 % IR SOLN
Status: DC | PRN
  Administered 2023-07-18: 3000 mL

## 2023-07-18 MED ORDER — ALBUMIN HUMAN 5 % IV SOLN: INTRAVENOUS | Status: DC | PRN

## 2023-07-18 MED ORDER — ROSUVASTATIN CALCIUM 20 MG PO TABS
10.0000 mg | ORAL_TABLET | Freq: Every evening | ORAL | Status: DC
  Administered 2023-07-18: 10 mg via ORAL

## 2023-07-18 MED ORDER — GABAPENTIN 300 MG PO CAPS
ORAL_CAPSULE | ORAL | Status: AC
  Filled 2023-07-18: qty 1

## 2023-07-18 MED ORDER — METOCLOPRAMIDE HCL 10 MG PO TABS
ORAL_TABLET | ORAL | Status: AC
Start: 2023-07-18 — End: ?
  Filled 2023-07-18: qty 1

## 2023-07-18 MED ORDER — DEXAMETHASONE SODIUM PHOSPHATE 10 MG/ML IJ SOLN
INTRAMUSCULAR | Status: AC
  Filled 2023-07-18: qty 1

## 2023-07-18 MED ORDER — CHLORHEXIDINE GLUCONATE 0.12 % MT SOLN
OROMUCOSAL | Status: AC
  Filled 2023-07-18: qty 15

## 2023-07-18 MED ORDER — MIDAZOLAM HCL 2 MG/2ML IJ SOLN
INTRAMUSCULAR | Status: AC
Start: 2023-07-18 — End: ?
  Filled 2023-07-18: qty 2

## 2023-07-18 MED ORDER — BUPIVACAINE LIPOSOME 1.3 % IJ SUSP
INTRAMUSCULAR | Status: AC
  Filled 2023-07-18: qty 10

## 2023-07-18 MED ORDER — ACETAMINOPHEN 10 MG/ML IV SOLN: 1000.0000 mg | Freq: Four times a day (QID) | INTRAVENOUS | Status: DC

## 2023-07-18 MED ORDER — LORATADINE 10 MG PO TABS: 10.0000 mg | ORAL_TABLET | Freq: Every day | ORAL | Status: DC

## 2023-07-18 MED ORDER — PHENOL 1.4 % MT LIQD
1.0000 | OROMUCOSAL | Status: DC | PRN
  Filled 2023-07-18: qty 177

## 2023-07-18 MED ORDER — EPHEDRINE SULFATE-NACL 50-0.9 MG/10ML-% IV SOSY
PREFILLED_SYRINGE | INTRAVENOUS | Status: DC | PRN
  Administered 2023-07-18 (×3): 5 mg via INTRAVENOUS

## 2023-07-18 MED ORDER — ROCURONIUM BROMIDE 100 MG/10ML IV SOLN
INTRAVENOUS | Status: DC | PRN
  Administered 2023-07-18: 50 mg via INTRAVENOUS
  Administered 2023-07-18: 20 mg via INTRAVENOUS

## 2023-07-18 MED ORDER — CELECOXIB 200 MG PO CAPS
400.0000 mg | ORAL_CAPSULE | Freq: Once | ORAL | Status: AC
  Administered 2023-07-18: 400 mg via ORAL

## 2023-07-18 MED ORDER — ACETAMINOPHEN 10 MG/ML IV SOLN: 1000.0000 mg | Freq: Once | INTRAVENOUS | Status: DC | PRN

## 2023-07-18 SURGICAL SUPPLY — 46 items
BLADE CLIPPER SURG (BLADE) IMPLANT
BLADE SAW 90X25X1.19 OSCILLAT (BLADE) ×1 IMPLANT
BRUSH SCRUB EZ PLAIN DRY (MISCELLANEOUS) ×1 IMPLANT
CUP SECTOR GRIPTON 50MM (Cup) IMPLANT
DRAPE INCISE IOBAN 66X60 STRL (DRAPES) ×1 IMPLANT
DRAPE SHEET LG 3/4 BI-LAMINATE (DRAPES) ×1 IMPLANT
DRSG AQUACEL AG ADV 3.5X14 (GAUZE/BANDAGES/DRESSINGS) ×1 IMPLANT
DRSG MEPILEX SACRM 8.7X9.8 (GAUZE/BANDAGES/DRESSINGS) ×1 IMPLANT
DRSG TEGADERM 4X4.75 (GAUZE/BANDAGES/DRESSINGS) ×1 IMPLANT
DURAPREP 26ML APPLICATOR (WOUND CARE) ×2 IMPLANT
ELECT CAUTERY BLADE 6.4 (BLADE) ×1 IMPLANT
ELECT REM PT RETURN 9FT ADLT (ELECTROSURGICAL) ×1
ELECTRODE REM PT RTRN 9FT ADLT (ELECTROSURGICAL) ×1 IMPLANT
EVACUATOR 1/8 PVC DRAIN (DRAIN) ×1 IMPLANT
GLOVE BIOGEL M STRL SZ7.5 (GLOVE) ×6 IMPLANT
GLOVE SURG UNDER POLY LF SZ8 (GLOVE) ×2 IMPLANT
GOWN STRL REUS W/ TWL LRG LVL3 (GOWN DISPOSABLE) ×2 IMPLANT
GOWN STRL REUS W/ TWL XL LVL3 (GOWN DISPOSABLE) ×1 IMPLANT
GOWN TOGA ZIPPER T7+ PEEL AWAY (MISCELLANEOUS) ×1 IMPLANT
HANDLE YANKAUER SUCT OPEN TIP (MISCELLANEOUS) ×1 IMPLANT
HEAD FEM STD 32X+5 STRL (Hips) IMPLANT
HOLDER FOLEY CATH W/STRAP (MISCELLANEOUS) ×1 IMPLANT
HOOD PEEL AWAY T7 (MISCELLANEOUS) ×1 IMPLANT
IV NS IRRIG 3000ML ARTHROMATIC (IV SOLUTION) ×1 IMPLANT
KIT PEG BOARD PINK (KITS) ×1 IMPLANT
KIT TURNOVER KIT A (KITS) ×1 IMPLANT
LINER ACETABULAR 32X50 (Liner) IMPLANT
MANIFOLD NEPTUNE II (INSTRUMENTS) ×2 IMPLANT
NS IRRIG 500ML POUR BTL (IV SOLUTION) ×1 IMPLANT
PACK HIP PROSTHESIS (MISCELLANEOUS) ×1 IMPLANT
PIN STEIN THRED 5/32 (Pin) ×1 IMPLANT
PULSAVAC PLUS IRRIG FAN TIP (DISPOSABLE) ×1
SOLUTION IRRIG SURGIPHOR (IV SOLUTION) ×1 IMPLANT
SPONGE DRAIN TRACH 4X4 STRL 2S (GAUZE/BANDAGES/DRESSINGS) ×1 IMPLANT
STAPLER SKIN PROX 35W (STAPLE) ×1 IMPLANT
STEM FEM SZ3 STD ACTIS (Stem) IMPLANT
SUT ETHIBOND #5 BRAIDED 30INL (SUTURE) ×1 IMPLANT
SUT VIC AB 0 CT1 36 (SUTURE) ×2 IMPLANT
SUT VIC AB 1 CT1 36 (SUTURE) ×2 IMPLANT
SUT VIC AB 2-0 CT1 TAPERPNT 27 (SUTURE) ×1 IMPLANT
TAPE CLOTH 3X10 WHT NS LF (GAUZE/BANDAGES/DRESSINGS) ×1 IMPLANT
TIP FAN IRRIG PULSAVAC PLUS (DISPOSABLE) ×1 IMPLANT
TOWEL OR 17X26 4PK STRL BLUE (TOWEL DISPOSABLE) IMPLANT
TRAP FLUID SMOKE EVACUATOR (MISCELLANEOUS) ×1 IMPLANT
TRAY FOLEY MTR SLVR 16FR STAT (SET/KITS/TRAYS/PACK) ×1 IMPLANT
WATER STERILE IRR 1000ML POUR (IV SOLUTION) ×1 IMPLANT

## 2023-07-18 NOTE — Anesthesia Procedure Notes (Signed)
Spinal  Patient location during procedure: OR Start time: 07/18/2023 7:15 AM End time: 07/18/2023 7:35 AM Reason for block: surgical anesthesia Staffing Anesthesiologist: Foye Deer, MD Resident/CRNA: Lanell Matar, CRNA Performed by: Foye Deer, MD Authorized by: Foye Deer, MD   Preanesthetic Checklist Completed: patient identified, IV checked, site marked, risks and benefits discussed, surgical consent, monitors and equipment checked, pre-op evaluation and timeout performed Spinal Block Patient position: sitting Prep: DuraPrep Patient monitoring: heart rate, cardiac monitor, continuous pulse ox and blood pressure Approach: midline Location: L3-4 Injection technique: single-shot Needle Needle type: Pencan  Needle gauge: 24 G Needle length: 9 cm Assessment Sensory level: T10 Events: CSF return and second provider Additional Notes CRNA with first attempt at L3/4 with accessing Right paramedian space. No paresthesias. Second, third, and fourth attempt by MD in order of L4/5 midline, Right paramedian, and Left paramedian. No paresthesia noted. Procedure was aborted.

## 2023-07-18 NOTE — Evaluation (Signed)
Physical Therapy Evaluation Patient Details Name: Lindsey Freeman MRN: [id number] DOB: Feb 08, 1946 Today's Date: 07/18/2023  History of Present Illness  Pt is a 78 y.o. 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  PMH includes L THA.  Clinical Impression  Prior to surgery, pt was ambulatory with RW for about the last 2 months; used L knee brace d/t L knee injury just prior to Thanksgiving 2024; lives with her husband on main level of home (has stair lift from garage).  R hip pain 2/10 at rest beginning/end of session and 5/10 with activity.  Pt educated on posterior THP's and required intermittent vc's during session to maintain.  Currently pt is min assist semi-supine to sitting EOB; CGA with transfer from bed; and CGA to ambulate a few feet bed to recliner with RW use.  Pt reporting her stomach was feeling "queasy" beginning of session (nurse notified and brought pt nausea medication)--no change in symptoms noted during session.  Pt also reporting feeling dizzy/lightheaded sitting up on EOB but symptoms resolved within a few minutes of sitting rest.  Pt would currently benefit from skilled PT to address noted impairments and functional limitations (see below for any additional details).  Upon hospital discharge, pt would benefit from ongoing therapy.     If plan is discharge home, recommend the following: A little help with walking and/or transfers;A little help with bathing/dressing/bathroom;Assistance with cooking/housework;Assist for transportation;Help with stairs or ramp for entrance   Can travel by private vehicle        Equipment Recommendations None recommended by PT (pt has RW at home already)  Recommendations for Other Services       Functional Status Assessment Patient has had a recent decline in their functional status and demonstrates the ability to make significant improvements in function in a reasonable and predictable amount of time.     Precautions /  Restrictions Precautions Precautions: Posterior Hip;Fall Precaution Booklet Issued: Yes (comment) Precaution Comments: Pt wears L knee brace d/t prior injury Restrictions Weight Bearing Restrictions Per Provider Order: Yes RLE Weight Bearing Per Provider Order: Weight bearing as tolerated      Mobility  Bed Mobility Overal bed mobility: Needs Assistance Bed Mobility: Supine to Sit     Supine to sit: Min assist     General bed mobility comments: assist for R LE; vc's for technique and posterior THP's    Transfers Overall transfer level: Needs assistance Equipment used: Rolling walker (2 wheels) Transfers: Sit to/from Stand Sit to Stand: Contact guard assist           General transfer comment: vc's for UE/LE placement and overall technique and posterior THP's    Ambulation/Gait Ambulation/Gait assistance: Contact guard assist Gait Distance (Feet): 3 Feet (bed to recliner) Assistive device: Rolling walker (2 wheels)   Gait velocity: decreased     General Gait Details: antalgic; decreased stance time R LE; vc's for walker use and overall technique  Stairs            Wheelchair Mobility     Tilt Bed    Modified Rankin (Stroke Patients Only)       Balance Overall balance assessment: Needs assistance Sitting-balance support: No upper extremity supported, Feet supported Sitting balance-Leahy Scale: Good Sitting balance - Comments: steady reaching within BOS   Standing balance support: Bilateral upper extremity supported Standing balance-Leahy Scale: Fair Standing balance comment: steady static standing with B UE support on RW  Pertinent Vitals/Pain Pain Assessment Pain Assessment: 0-10 Pain Score: 2  (2/10 at rest; 5/10 with activity) Pain Location: posterior R hip Pain Descriptors / Indicators: Aching, Tender, Sore Pain Intervention(s): Limited activity within patient's tolerance, Monitored during session,  Repositioned, Ice applied, Other (comment) (RN updated on pt's pain) HR 83-88 bpm and SpO2 sats 93% or greater on room air during sessions activities.    Home Living Family/patient expects to be discharged to:: Private residence Living Arrangements: Spouse/significant other Available Help at Discharge: Family;Available PRN/intermittently Type of Home: House Home Access: Stairs to enter   Entrance Stairs-Number of Steps: has stair lift from garage   Home Layout: One level Home Equipment: Grab bars - toilet;Other (comment);Shower seat - built Char[employer] fundraiser (2 wheels) (Stair lift)      Prior Function Prior Level of Function : Independent/Modified Independent             Mobility Comments: Pt reports she fell 3 days before Thanksgiving 2024 and sustained L knee injury (torn meniscus and bone bruise) and now always wears L knee brace.  Has used RW since this fall for walking (prior to this fall, pt was not using any AD for ambulation).       Extremity/Trunk Assessment   Upper Extremity Assessment Upper Extremity Assessment: Overall WFL for tasks assessed    Lower Extremity Assessment Lower Extremity Assessment: Generalized weakness;RLE deficits/detail RLE Deficits / Details: at least 3-/5 hip flexion; at least 3/5 AROM knee flexion/extension and DF RLE: Unable to fully assess due to pain    Cervical / Trunk Assessment Cervical / Trunk Assessment: Normal  Communication   Communication Communication: No apparent difficulties Cueing Techniques: Verbal cues;Visual cues  Cognition Arousal: Alert Behavior During Therapy: WFL for tasks assessed/performed Overall Cognitive Status: Within Functional Limits for tasks assessed                                          General Comments General comments (skin integrity, edema, etc.): R hip dressing and hemovac in place beginning/end of session.  Nursing cleared pt for participation in physical therapy.  Pt  agreeable to PT session.     Exercises Total Joint Exercises Ankle Circles/Pumps: AROM, Strengthening, Both, 10 reps, Supine Quad Sets: AROM, Strengthening, Both, 10 reps, Supine Gluteal Sets: AROM, Strengthening, Both, 10 reps, Supine Towel Squeeze: AROM, Strengthening, Both, 10 reps, Supine Short Arc Quad: AROM, Strengthening, Right, 10 reps, Supine Heel Slides: AAROM, Strengthening, Right, 10 reps, Supine Hip ABduction/ADduction: AAROM, Strengthening, Right, 10 reps, Supine   Assessment/Plan    PT Assessment Patient needs continued PT services  PT Problem List Decreased strength;Decreased activity tolerance;Decreased balance;Decreased mobility;Decreased knowledge of use of DME;Decreased knowledge of precautions;Decreased skin integrity;Pain       PT Treatment Interventions DME instruction;Gait training;Stair training;Functional mobility training;Therapeutic activities;Therapeutic exercise;Balance training;Patient/family education    PT Goals (Current goals can be found in the Care Plan section)  Acute Rehab PT Goals Patient Stated Goal: to improve pain and walking PT Goal Formulation: With patient Time For Goal Achievement: 08/01/23 Potential to Achieve Goals: Good    Frequency BID     Co-evaluation               AM-PAC PT "6 Clicks" Mobility  Outcome Measure Help needed turning from your back to your side while in a flat bed without using bedrails?: A Little Help needed moving from lying  on your back to sitting on the side of a flat bed without using bedrails?: A Little Help needed moving to and from a bed to a chair (including a wheelchair)?: A Little Help needed standing up from a chair using your arms (e.g., wheelchair or bedside chair)?: A Little Help needed to walk in hospital room?: A Little Help needed climbing 3-5 steps with a railing? : A Little 6 Click Score: 18    End of Session Equipment Utilized During Treatment: Gait belt Activity Tolerance:  Patient tolerated treatment well Patient left: in chair;with call bell/phone within reach;with nursing/sitter in room;with SCD's reapplied;Other (comment) (ice pack to R hip; pillows between knees for posterior THP's and B heels floating) Nurse Communication: Mobility status;Weight bearing status;Other (comment) (pt requesting nausea medication d/t stomach feeling "queasy") PT Visit Diagnosis: Other abnormalities of gait and mobility (R26.89);Muscle weakness (generalized) (M62.81);Pain Pain - Right/Left: Right Pain - part of body: Hip    Time: 5784-6962 PT Time Calculation (min) (ACUTE ONLY): 41 min   Charges:   PT Evaluation $PT Eval Low Complexity: 1 Low PT Treatments $Therapeutic Exercise: 8-22 mins $Therapeutic Activity: 8-22 mins PT General Charges $$ ACUTE PT VISIT: 1 Visit        Hendricks Limes, PT 07/18/23, 8:05 PM

## 2023-07-18 NOTE — Progress Notes (Signed)
Subjective: 1 Day Post-Op Procedure(s) (LRB): TOTAL HIP ARTHROPLASTY (Right) Patient reports pain as mild.   Patient seen in rounds with Dr. Ernest Pine. Patient is well, and has had no acute complaints or problems. Denies any CP, SOB, N/V, fevers or chills. We will start therapy today.  Plan is to go Home after hospital stay.  Objective: Vital signs in last 24 hours: Temp:  [97.2 F (36.2 C)-98.5 F (36.9 C)] 98.2 F (36.8 C) (01/28 0729) Pulse Rate:  [70-88] 70 (01/28 0729) Resp:  [16-20] 16 (01/28 0729) BP: (104-168)/(55-83) 119/65 (01/28 0729) SpO2:  [96 %-99 %] 96 % (01/28 0729)  Intake/Output from previous day:  Intake/Output Summary (Last 24 hours) at 07/19/2023 0814 Last data filed at 07/19/2023 0622 Gross per 24 hour  Intake 3058.96 ml  Output 280 ml  Net 2778.96 ml    Intake/Output this shift: No intake/output data recorded.  Labs: No results for input(s): "HGB" in the last 72 hours. No results for input(s): "WBC", "RBC", "HCT", "PLT" in the last 72 hours. No results for input(s): "NA", "K", "CL", "CO2", "BUN", "CREATININE", "GLUCOSE", "CALCIUM" in the last 72 hours. No results for input(s): "LABPT", "INR" in the last 72 hours.  EXAM General - Patient is Alert, Appropriate, and Oriented Extremity - Neurologically intact ABD soft Neurovascular intact Sensation intact distally Intact pulses distally Dorsiflexion/Plantar flexion intact No cellulitis present Compartment soft Dressing - dressing C/D/I and no drainage Motor Function - intact, moving foot and toes well on exam. Able to plantar and dorsiflex with good strength and ROM. Neurovascularly intact to all dermatomes down her RLE. Posterior tibial pulses appreciated. JP Drain pulled without difficulty. Intact  Past Medical History:  Diagnosis Date   Arthritis    Back pain    High cholesterol    Hyperlipidemia     Assessment/Plan: 1 Day Post-Op Procedure(s) (LRB): TOTAL HIP ARTHROPLASTY  (Right) Principal Problem:   Hx of total hip arthroplasty, right  Estimated body mass index is 29.87 kg/m as calculated from the following:   Height as of this encounter: 5\' 1"  (1.549 m).   Weight as of this encounter: 71.7 kg. Advance diet Up with therapy  Patient will continue to work with physical therapy to pass postoperative PT protocols, ROM and strengthening  Hip Preacutions  Discussed with the patient continuing to utilize ice over the bandage  Patient will wear TED hose bilaterally to help prevent DVT and clot formation  Discussed the Aquacel bandage.  This bandage will stay in place 7 days postoperatively.  Can be replaced with honeycomb bandages that will be sent home with the patient  Discussed sending the patient home with tramadol and oxycodone for as needed pain management.  Patient will also continue on her at home meloxicam once daily to help with swelling and inflammation.  Patient will take an 81 mg aspirin twice daily for DVT prophylaxis  JP drain removed without difficulty, intact  Weight-Bearing as tolerated to right leg  Patient will follow-up with [facility] orthopedics in 6 weeks for re-imaging and reevaluation   Rayburn Go, PA-C [facility] 07/19/2023, 8:14 AM

## 2023-07-18 NOTE — Plan of Care (Signed)
Reviewed procedure and plan of care/discharge with patient

## 2023-07-18 NOTE — Progress Notes (Signed)
Patient is not able to walk the distance required to go the bathroom, or he/she is unable to safely negotiate stairs required to access the bathroom.  A 3in1 BSC will alleviate this problem   Raad Clayson P. [name] M.D.

## 2023-07-18 NOTE — Anesthesia Procedure Notes (Signed)
Procedure Name: Intubation Date/Time: 07/18/2023 7:46 AM  Performed by: Lanell Matar, CRNAPre-anesthesia Checklist: Patient identified, Emergency Drugs available, Suction available and Patient being monitored Patient Re-evaluated:Patient Re-evaluated prior to induction Oxygen Delivery Method: Circle System Utilized Preoxygenation: Pre-oxygenation with 100% oxygen Induction Type: IV induction Ventilation: Mask ventilation without difficulty and Oral airway inserted - appropriate to patient size Laryngoscope Size: McGrath and 4 Grade View: Grade I Tube type: Oral Tube size: 7.0 mm Number of attempts: 1 Airway Equipment and Method: Stylet and Oral airway Placement Confirmation: ETT inserted through vocal cords under direct vision, positive ETCO2 and breath sounds checked- equal and bilateral Secured at: 21 cm Tube secured with: Tape Dental Injury: Teeth and Oropharynx as per pre-operative assessment

## 2023-07-18 NOTE — Op Note (Signed)
OPERATIVE NOTE  DATE OF SURGERY:  07/18/2023  PATIENT NAME:  Lindsey Freeman   DOB: 1946/03/12  MRN: [id number]  PRE-OPERATIVE DIAGNOSIS: Degenerative arthrosis of the right hip, primary  POST-OPERATIVE DIAGNOSIS:  Same  PROCEDURE:  Right total hip arthroplasty  SURGEON:  Jena Gauss. M.D.  ASSISTANT:  Gean Birchwood, PA-C (present and scrubbed throughout the case, critical for assistance with exposure, retraction, instrumentation, and closure)  ANESTHESIA: general  ESTIMATED BLOOD LOSS: 75 mL  FLUIDS REPLACED: 800 mL of crystalloid 250 ml albumin  DRAINS: 2 medium Hemovac drains  IMPLANTS UTILIZED: DePuy size 3 standard offset Actis femoral stem, 50 mm OD Pinnacle GRIPTION Sector acetabular component, neutral Pinnacle Altrx polyethylene insert, and a 32 mm CoCr +5 mm hip ball  INDICATIONS FOR SURGERY: [name] is a 78 y.o. year old female with a long history of progressive hip and groin  pain. X-rays demonstrated severe degenerative changes. The patient had not seen any significant improvement despite conservative nonsurgical intervention. After discussion of the risks and benefits of surgical intervention, the patient expressed understanding of the risks benefits and agree with plans for total hip arthroplasty.   The risks, benefits, and alternatives were discussed at length including but not limited to the risks of infection, bleeding, nerve injury, stiffness, blood clots, the need for revision surgery, limb length inequality, dislocation, cardiopulmonary complications, among others, and they were willing to proceed.  PROCEDURE IN DETAIL: The patient was brought into the operating room and, after adequate general anesthesia was achieved, the patient was placed in a left lateral decubitus position. Axillary roll was placed and all bony prominences were well-padded. The patient's right hip was cleaned and prepped with alcohol and DuraPrep and draped in the usual sterile fashion.  A "timeout" was performed as per usual protocol. A lateral curvilinear incision was made gently curving towards the posterior superior iliac spine. The IT band was incised in line with the skin incision and the fibers of the gluteus maximus were split in line. The piriformis tendon was identified, skeletonized, and incised at its insertion to the proximal femur and reflected posteriorly. A T type posterior capsulotomy was performed. Prior to dislocation of the femoral head, a threaded Steinmann pin was inserted through a separate stab incision into the pelvis superior to the acetabulum and bent in the form of a stylus so as to assess limb length and hip offset throughout the procedure. The femoral head was then dislocated posteriorly. Inspection of the femoral head demonstrated severe degenerative changes with full-thickness loss of articular cartilage. The femoral neck cut was performed using an oscillating saw. The anterior capsule was elevated off of the femoral neck using a periosteal elevator. Attention was then directed to the acetabulum. The remnant of the labrum was excised using electrocautery. Inspection of the acetabulum also demonstrated significant degenerative changes. The acetabulum was reamed in sequential fashion up to a 49 mm diameter. Good punctate bleeding bone was encountered. A 50 mm Pinnacle GRIPTION Sector acetabular component was positioned and impacted into place. Good scratch fit was appreciated. A neutral polyethylene trial was inserted.  Attention was then directed to the proximal femur.  Femoral broaches were inserted in a sequential fashion up to a size 3 broach. Calcar region was planed and a trial reduction was performed using a standard offset neck and a 32 mm hip ball with first a +1 and subsequently a +5 mm neck length. Good equalization of limb lengths and hip offset was appreciated and excellent  stability was noted both anteriorly and posteriorly. Trial components were  removed. The acetabular shell was irrigated with copious amounts of normal saline with antibiotic solution and suctioned dry. A neutral Pinnacle Altrx polyethylene insert was positioned and impacted into place. Next, a size 3 standard offset Actis femoral stem was positioned and impacted into place. Excellent scratch fit was appreciated. A trial reduction was again performed with a 32 mm hip ball with a +5 mm neck length. Again, good equalization of limb lengths was appreciated and excellent stability appreciated both anteriorly and posteriorly. The hip was then dislocated and the trial hip ball was removed. The Morse taper was cleaned and dried. A 32 mm cobalt chrome hip ball with a +5 mm neck length was placed on the trunnion and impacted into place. The hip was then reduced and placed through range of motion. Excellent stability was appreciated both anteriorly and posteriorly.  The wound was irrigated with copious amounts of normal saline followed by 450 ml of Surgiphor and suctioned dry. Good hemostasis was appreciated. The posterior capsulotomy was repaired using #5 Ethibond. Piriformis tendon was reapproximated to the undersurface of the gluteus medius tendon using #5 Ethibond. The IT band was reapproximated using interrupted sutures of #1 Vicryl. Subcutaneous tissue was approximated using first #0 Vicryl followed by #2-0 Vicryl. The skin was closed with skin staples.  The patient tolerated the procedure well and was transported to the recovery room in stable condition.   Jena Gauss., M.D.

## 2023-07-18 NOTE — Discharge Summary (Signed)
Physician Discharge Summary  Subjective: 1 Day Post-Op Procedure(s) (LRB): TOTAL HIP ARTHROPLASTY (Right) Patient reports pain as mild.   Patient seen in rounds with Dr. Ernest Pine. Patient is well, and has had no acute complaints or problems. Denies any CP, SOB, N/V, fevers or chills. We will start therapy today.  Patient is ready to go home  Physician Discharge Summary  Patient ID: MELAYAH SKORUPSKI MRN: [id number] DOB/AGE: 1946/06/10 78 y.o.  Admit date: 07/18/2023 Discharge date: 07/19/2023  Admission Diagnoses:  Discharge Diagnoses:  Principal Problem:   Hx of total hip arthroplasty, right   Discharged Condition: good  Hospital Course: Patient presented to the hospital on 07/18/2023 for an elective right total hip arthroplasty performed by Dr. Ernest Pine. Patient was given 1g of TXA and 2g of Ancef prior to the procedure. she tolerated the procedure well without any complications. See procedural note below for details. Postoperatively, the patient did very well. she was able to pass PT protocols on post-op day one without any issues. JP drain was removed without any difficulty and was intact. she was able to void her bladder without any difficulty. Physical exam was unremarkable. she denies any SOB, CP, N/V, fevers or chills. Vital signs are stable. Patient is stable to discharge home.  PROCEDURE:  Right total hip arthroplasty   SURGEON:  Jena Gauss. M.D.   ASSISTANT:  Gean Birchwood, PA-C (present and scrubbed throughout the case, critical for assistance with exposure, retraction, instrumentation, and closure)   ANESTHESIA: general   ESTIMATED BLOOD LOSS: 75 mL   FLUIDS REPLACED: 800 mL of crystalloid   250 ml albumin   DRAINS: 2 medium Hemovac drains   IMPLANTS UTILIZED: DePuy size 3 standard offset Actis femoral stem, 50 mm OD Pinnacle GRIPTION Sector acetabular component, neutral Pinnacle Altrx polyethylene insert, and a 32 mm CoCr +5 mm hip ball    Treatments:  none  Discharge Exam: Blood pressure 119/65, pulse 70, temperature 98.2 F (36.8 C), temperature source Oral, resp. rate 16, height 5\' 1"  (1.549 m), weight 71.7 kg, SpO2 96%.   Disposition: home   Allergies as of 07/19/2023       Reactions   Duloxetine Diarrhea, Nausea Only        Medication List     TAKE these medications    acetaminophen 650 MG CR tablet Commonly known as: TYLENOL Take 650 mg by mouth in the morning and at bedtime.   alendronate 70 MG tablet Commonly known as: FOSAMAX Take 70 mg by mouth once a week. Take with a full glass of water on an empty stomach.   aspirin 81 MG chewable tablet Chew 1 tablet (81 mg total) by mouth 2 (two) times daily.   Biotin 10 MG Tabs Take 10 mg by mouth in the morning.   CALTRATE 600+D PO Take 1 tablet by mouth in the morning and at bedtime.   cetirizine 10 MG tablet Commonly known as: ZYRTEC Take 10 mg by mouth in the morning.   famotidine 20 MG tablet Commonly known as: PEPCID Take 20-40 mg by mouth daily as needed for heartburn or indigestion.   fluticasone 50 MCG/ACT nasal spray Commonly known as: FLONASE Place 1 spray into both nostrils at bedtime.   gabapentin 100 MG capsule Commonly known as: NEURONTIN Take 100 mg by mouth 2 (two) times daily.   meloxicam 15 MG tablet Commonly known as: MOBIC Take 15 mg by mouth in the morning.   multivitamin with minerals Tabs tablet Take 1 tablet  by mouth in the morning. Centrum Multivitamin   oxyCODONE 5 MG immediate release tablet Commonly known as: Oxy IR/ROXICODONE Take 1 tablet (5 mg total) by mouth every 4 (four) hours as needed for moderate pain (pain score 4-6) (pain score 4-6).   oxymetazoline 0.05 % nasal spray Commonly known as: AFRIN Place 1 spray into both nostrils at bedtime.   PRESERVISION AREDS 2 PO Take 1 tablet by mouth in the morning.   rosuvastatin 10 MG tablet Commonly known as: CRESTOR Take 10 mg by mouth every evening.    tiZANidine 4 MG tablet Commonly known as: ZANAFLEX Take 4 mg by mouth at bedtime.   traMADol 50 MG tablet Commonly known as: ULTRAM Take 1-2 tablets (50-100 mg total) by mouth every 4 (four) hours as needed for moderate pain (pain score 4-6).               Durable Medical Equipment  (From admission, onward)           Start     Ordered   07/18/23 1133  DME Walker rolling  Once       Question:  Patient needs a walker to treat with the following condition  Answer:  S/P total hip arthroplasty   07/18/23 1132   07/18/23 1133  DME Bedside commode  Once       Comments: Patient is not able to walk the distance required to go the bathroom, or he/she is unable to safely negotiate stairs required to access the bathroom.  A 3in1 BSC will alleviate this problem  Question:  Patient needs a bedside commode to treat with the following condition  Answer:  S/P total hip arthroplasty   07/18/23 1132            Follow-up Information     Hooten, Illene Labrador, MD Follow up on 08/30/2023.   Specialty: Orthopedic Surgery Why: at 2:30pm Contact information: [address] [facility] [place] [address] [phone]                 Signed: Gean Birchwood 07/19/2023, 8:15 AM   Objective: Vital signs in last 24 hours: Temp:  [97.2 F (36.2 C)-98.5 F (36.9 C)] 98.2 F (36.8 C) (01/28 0729) Pulse Rate:  [70-88] 70 (01/28 0729) Resp:  [16-20] 16 (01/28 0729) BP: (104-168)/(55-83) 119/65 (01/28 0729) SpO2:  [96 %-99 %] 96 % (01/28 0729)  Intake/Output from previous day:  Intake/Output Summary (Last 24 hours) at 07/19/2023 0815 Last data filed at 07/19/2023 0622 Gross per 24 hour  Intake 3058.96 ml  Output 280 ml  Net 2778.96 ml    Intake/Output this shift: No intake/output data recorded.  Labs: No results for input(s): "HGB" in the last 72 hours. No results for input(s): "WBC", "RBC", "HCT", "PLT" in the last 72 hours. No results for input(s): "NA",  "K", "CL", "CO2", "BUN", "CREATININE", "GLUCOSE", "CALCIUM" in the last 72 hours. No results for input(s): "LABPT", "INR" in the last 72 hours.  EXAM: General - Patient is Alert, Appropriate, and Oriented Extremity - Neurologically intact ABD soft Neurovascular intact Sensation intact distally Intact pulses distally Dorsiflexion/Plantar flexion intact No cellulitis present Compartment soft Dressing - dressing C/D/I and no drainage Motor Function - intact, moving foot and toes well on exam. Able to plantar and dorsiflex with good strength and ROM. Neurovascularly intact to all dermatomes down her RLE. Posterior tibial pulses appreciated. JP Drain pulled without difficulty. Intact  Assessment/Plan: 1 Day Post-Op Procedure(s) (LRB): TOTAL HIP ARTHROPLASTY (  Right) Procedure(s) (LRB): TOTAL HIP ARTHROPLASTY (Right) Past Medical History:  Diagnosis Date   Arthritis    Back pain    High cholesterol    Hyperlipidemia    Principal Problem:   Hx of total hip arthroplasty, right  Estimated body mass index is 29.87 kg/m as calculated from the following:   Height as of this encounter: 5\' 1"  (1.549 m).   Weight as of this encounter: 71.7 kg.   Patient will continue to work with physical therapy to pass postoperative PT protocols, ROM and strengthening   Hip Preacutions   Discussed with the patient continuing to utilize ice over the bandage   Patient will wear TED hose bilaterally to help prevent DVT and clot formation   Discussed the Aquacel bandage.  This bandage will stay in place 7 days postoperatively.  Can be replaced with honeycomb bandages that will be sent home with the patient   Discussed sending the patient home with tramadol and oxycodone for as needed pain management.  Patient will also continue on her at home meloxicam once daily to help with swelling and inflammation.  Patient will take an 81 mg aspirin twice daily for DVT prophylaxis   JP drain removed without  difficulty, intact   Weight-Bearing as tolerated to right leg   Patient will follow-up with [facility] orthopedics in 6 weeks for re-imaging and reevaluation  Diet - Regular diet Follow up - in 6 weeks Activity - WBAT Disposition - Home Condition Upon Discharge - Good DVT Prophylaxis - Aspirin and TED hose  Danise Edge, PA-C Orthopaedic Surgery 07/19/2023, 8:15 AM

## 2023-07-18 NOTE — Transfer of Care (Signed)
Immediate Anesthesia Transfer of Care Note  Patient: Lindsey Freeman  Procedure(s) Performed: TOTAL HIP ARTHROPLASTY (Right: Hip)  Patient Location: PACU  Anesthesia Type:General  Level of Consciousness: awake, alert , oriented, and drowsy  Airway & Oxygen Therapy: Patient Spontanous Breathing and Patient connected to face mask oxygen  Post-op Assessment: Report given to RN, Post -op Vital signs reviewed and stable, and Patient moving all extremities X 4  Post vital signs: Reviewed and stable  Last Vitals:  Vitals Value Taken Time  BP 168/83 07/18/23 1135  Temp    Pulse 86 07/18/23 1141  Resp 12 07/18/23 1141  SpO2 100 % 07/18/23 1141  Vitals shown include unfiled device data.  Last Pain:  Vitals:   07/18/23 0647  TempSrc: Oral  PainSc: 4          Complications: No notable events documented.

## 2023-07-18 NOTE — Interval H&P Note (Signed)
History and Physical Interval Note:  07/18/2023 6:09 AM  Lindsey Freeman  has presented today for surgery, with the diagnosis of PRIMARY OSTEOARTHRITIS OF RIGHT HIP.  The various methods of treatment have been discussed with the patient and family. After consideration of risks, benefits and other options for treatment, the patient has consented to  Procedure(s): TOTAL HIP ARTHROPLASTY (Right) as a surgical intervention.  The patient's history has been reviewed, patient examined, no change in status, stable for surgery.  I have reviewed the patient's chart and labs.  Questions were answered to the patient's satisfaction.     Volney Reierson P Dulcie Gammon

## 2023-07-18 NOTE — Interval H&P Note (Signed)
History and Physical Interval Note:  07/18/2023 6:08 AM  Lindsey Freeman  has presented today for surgery, with the diagnosis of PRIMARY OSTEOARTHRITIS OF RIGHT HIP.  The various methods of treatment have been discussed with the patient and family. After consideration of risks, benefits and other options for treatment, the patient has consented to  Procedure(s): TOTAL HIP ARTHROPLASTY (Right) as a surgical intervention.  The patient's history has been reviewed, patient examined, no change in status, stable for surgery.  I have reviewed the patient's chart and labs.  Questions were answered to the patient's satisfaction.     Jabarri Stefanelli P Kanasia Gayman

## 2023-07-18 NOTE — Anesthesia Preprocedure Evaluation (Addendum)
Anesthesia Evaluation  Patient identified by MRN, date of birth, ID band Patient awake    Reviewed: Allergy & Precautions, H&P , NPO status , Patient's Chart, lab work & pertinent test results  Airway Mallampati: II  TM Distance: >3 FB Neck ROM: full    Dental no notable dental hx.    Pulmonary neg pulmonary ROS   Pulmonary exam normal        Cardiovascular negative cardio ROS Normal cardiovascular exam  ECHO : NORMAL LEFT VENTRICULAR SYSTOLIC FUNCTION WITH NO LVH  ESTIMATED EF: >55%, CALC EF(2D): 59%  NORMAL LA PRESSURES WITH DIASTOLIC DYSFUNCTION (GRADE 1)  NORMAL RIGHT VENTRICULAR SYSTOLIC FUNCTION  VALVULAR REGURGITATION: MILD AR, MILD MR, TRIVIAL PR, MILD TR     Neuro/Psych negative neurological ROS  negative psych ROS   GI/Hepatic negative GI ROS, Neg liver ROS,,,  Endo/Other  negative endocrine ROS    Renal/GU Renal disease     Musculoskeletal  (+) Arthritis ,    Abdominal Normal abdominal exam  (+)   Peds  Hematology negative hematology ROS (+)   Anesthesia Other Findings Past Medical History: No date: Arthritis No date: Back pain No date: High cholesterol No date: Hyperlipidemia  Past Surgical History: No date: ABDOMINAL HYSTERECTOMY 03/03/2021: COLONOSCOPY WITH PROPOFOL; N/A     Comment:  Procedure: COLONOSCOPY WITH PROPOFOL;  Surgeon:               Regis Bill, MD;  Location: [facility] ENDOSCOPY;                Service: Endoscopy;  Laterality: N/A; 03/07/2019: TOTAL HIP ARTHROPLASTY; Left     Comment:  Procedure: TOTAL HIP ARTHROPLASTY;  Surgeon: Donato Heinz, MD;  Location: [facility] ORS;  Service: Orthopedics;               Laterality: Left;  BMI    Body Mass Index: 29.87 kg/m      Reproductive/Obstetrics negative OB ROS                              Anesthesia Physical Anesthesia Plan  ASA: 2  Anesthesia Plan: General/Spinal   Post-op Pain  Management: Ofirmev IV (intra-op)*, Toradol IV (intra-op)* and Ketamine IV*   Induction: Intravenous  PONV Risk Score and Plan: 2 and Propofol infusion, Ondansetron and Dexamethasone  Airway Management Planned: Oral ETT and Natural Airway  Additional Equipment:   Intra-op Plan:   Post-operative Plan: Extubation in OR  Informed Consent: I have reviewed the patients History and Physical, chart, labs and discussed the procedure including the risks, benefits and alternatives for the proposed anesthesia with the patient or authorized representative who has indicated his/her understanding and acceptance.     Dental Advisory Given  Plan Discussed with: CRNA and Surgeon  Anesthesia Plan Comments: (Consented for spinal with back-up GETA)         Anesthesia Quick Evaluation

## 2023-07-19 ENCOUNTER — Encounter: Payer: Self-pay | Admitting: Orthopedic Surgery

## 2023-07-19 DIAGNOSIS — M1611 Unilateral primary osteoarthritis, right hip: Secondary | ICD-10-CM | POA: Diagnosis not present

## 2023-07-19 MED ORDER — TRAMADOL HCL 50 MG PO TABS: 50.0000 mg | ORAL_TABLET | ORAL | 0 refills | Status: AC | PRN

## 2023-07-19 MED ORDER — ASPIRIN 81 MG PO CHEW
CHEWABLE_TABLET | ORAL | Status: AC
  Filled 2023-07-19: qty 1

## 2023-07-19 MED ORDER — GABAPENTIN 100 MG PO CAPS
ORAL_CAPSULE | ORAL | Status: AC
Start: 2023-07-19 — End: ?
  Filled 2023-07-19: qty 1

## 2023-07-19 MED ORDER — CELECOXIB 200 MG PO CAPS
ORAL_CAPSULE | ORAL | Status: AC
  Filled 2023-07-19: qty 1

## 2023-07-19 MED ORDER — FERROUS SULFATE 325 (65 FE) MG PO TABS
ORAL_TABLET | ORAL | Status: AC
  Filled 2023-07-19: qty 1

## 2023-07-19 MED ORDER — SENNOSIDES-DOCUSATE SODIUM 8.6-50 MG PO TABS
ORAL_TABLET | ORAL | Status: AC
  Filled 2023-07-19: qty 1

## 2023-07-19 MED ORDER — ACETAMINOPHEN 10 MG/ML IV SOLN
INTRAVENOUS | Status: AC
  Filled 2023-07-19: qty 100

## 2023-07-19 MED ORDER — OXYCODONE HCL 5 MG PO TABS
ORAL_TABLET | ORAL | Status: AC
  Filled 2023-07-19: qty 1

## 2023-07-19 MED ORDER — ASPIRIN 81 MG PO CHEW: 81.0000 mg | CHEWABLE_TABLET | Freq: Two times a day (BID) | ORAL | Status: AC

## 2023-07-19 MED ORDER — OXYCODONE HCL 5 MG PO TABS: 5.0000 mg | ORAL_TABLET | ORAL | 0 refills | Status: AC | PRN

## 2023-07-19 MED ORDER — PANTOPRAZOLE SODIUM 40 MG PO TBEC
DELAYED_RELEASE_TABLET | ORAL | Status: AC
  Filled 2023-07-19: qty 1

## 2023-07-19 MED ORDER — MAGNESIUM HYDROXIDE 400 MG/5ML PO SUSP
ORAL | Status: AC
  Filled 2023-07-19: qty 30

## 2023-07-19 MED ORDER — LORATADINE 10 MG PO TABS
ORAL_TABLET | ORAL | Status: AC
  Filled 2023-07-19: qty 1

## 2023-07-19 MED ORDER — METOCLOPRAMIDE HCL 10 MG PO TABS
ORAL_TABLET | ORAL | Status: AC
  Filled 2023-07-19: qty 1

## 2023-07-19 NOTE — Evaluation (Signed)
Occupational Therapy Evaluation Patient Details Name: Lindsey Freeman MRN: [id number] DOB: 1946/03/18 Today's Date: 07/19/2023   History of Present Illness Pt is a 78 y.o. 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  PMH includes L THA.   Clinical Impression   Pt seen for OT evaluation this date, POD#1 from above surgery.Pt is generally MOD I-I in ADL/IADL, amb with RW since fall in November. Pt with good recall and carry over of hip precautions. Pt provided education via demo and hand out re:  in posterior total hip precautions and how to implement, self care skills, falls prevention strategies, home/routines modifications, DME/AE for LB bathing and dressing tasks, compression stocking mgt strategies.  Pt would benefit from additional instruction in self care skills and techniques to help maintain precautions with or without assistive devices to support recall and carryover prior to discharge. OT will follow acutely.     If plan is discharge home, recommend the following: A little help with bathing/dressing/bathroom;Assistance with cooking/housework;Help with stairs or ramp for entrance;Assist for transportation    Functional Status Assessment  Patient has had a recent decline in their functional status and demonstrates the ability to make significant improvements in function in a reasonable and predictable amount of time.  Equipment Recommendations  Other (comment) (PRN sock aid- pt will acquire if necessary)    Recommendations for Other Services       Precautions / Restrictions Precautions Precautions: Posterior Hip;Fall Precaution Comments: Pt wears L knee brace d/t prior injury Restrictions Weight Bearing Restrictions Per Provider Order: Yes RLE Weight Bearing Per Provider Order: Weight bearing as tolerated      Mobility Bed Mobility               General bed mobility comments: NT in recliner pre/post session    Transfers Overall transfer level: Needs  assistance Equipment used: Rolling walker (2 wheels) Transfers: Sit to/from Stand Sit to Stand: Supervision, Contact guard assist                  Balance Overall balance assessment: Needs assistance Sitting-balance support: No upper extremity supported, Feet supported Sitting balance-Leahy Scale: Good     Standing balance support: Bilateral upper extremity supported Standing balance-Leahy Scale: Good                             ADL either performed or assessed with clinical judgement   ADL Overall ADL's : Needs assistance/impaired Eating/Feeding: Set up;Sitting   Grooming: Wash/dry hands;Standing;Supervision/safety Grooming Details (indicate cue type and reason): with RW at sink level         Upper Body Dressing : Set up;Sitting Upper Body Dressing Details (indicate cue type and reason): nightgown Lower Body Dressing: Minimal assistance;Sitting/lateral leans;Cueing for sequencing;Adhering to hip precautions Lower Body Dressing Details (indicate cue type and reason): donn underwear with reacher, frequent vcs for technique Toilet Transfer: Supervision/safety;Rolling walker (2 wheels);BSC/3in1;Ambulation Toilet Transfer Details (indicate cue type and reason): intermittent vcs for technique to adhere to precautions Toileting- Clothing Manipulation and Hygiene: Supervision/safety;Sitting/lateral lean Toileting - Clothing Manipulation Details (indicate cue type and reason): anticipate     Functional mobility during ADLs: Supervision/safety;Rolling walker (2 wheels) (approx 15' two attempts with RW)       Vision Baseline Vision/History: 1 Wears glasses Patient Visual Report: No change from baseline       Perception         Praxis  Pertinent Vitals/Pain Pain Assessment Pain Assessment: 0-10 Pain Score: 4  Pain Location: R hip/upper thigh Pain Descriptors / Indicators: Aching, Discomfort, Sore Pain Intervention(s): Monitored during session,  Repositioned, Ice applied, Limited activity within patient's tolerance     Extremity/Trunk Assessment Upper Extremity Assessment Upper Extremity Assessment: Overall WFL for tasks assessed   Lower Extremity Assessment Lower Extremity Assessment: RLE deficits/detail RLE Deficits / Details: s/p R hip replacement   Cervical / Trunk Assessment Cervical / Trunk Assessment: Normal   Communication Communication Communication: No apparent difficulties Cueing Techniques: Verbal cues;Visual cues   Cognition Arousal: Alert Behavior During Therapy: WFL for tasks assessed/performed Overall Cognitive Status: Within Functional Limits for tasks assessed                                       General Comments  dressing intact pre/post session    Exercises     Shoulder Instructions      Home Living Family/patient expects to be discharged to:: Private residence Living Arrangements: Spouse/significant other Available Help at Discharge: Family;Available PRN/intermittently Type of Home: House Home Access: Stairs to enter [employer] of Steps: has stair lift from garage   Home Layout: One level     Bathroom Shower/Tub: Producer, television/film/video: Handicapped height     Home Equipment: Grab bars - toilet;Shower seat - built Charity fundraiser (2 wheels);BSC/3in1;Adaptive equipment Adaptive Equipment: Reacher        Prior Functioning/Environment Prior Level of Function : Independent/Modified Independent             Mobility Comments: amb with RW since November 2024 due to fall injuring R knee ADLs Comments: generally MOD I-I in ADL/IADL, husband assists as needed        OT Problem List: Decreased activity tolerance;Decreased knowledge of use of DME or AE;Impaired balance (sitting and/or standing)      OT Treatment/Interventions: Self-care/ADL training;DME and/or AE instruction;Therapeutic activities;Balance training;Therapeutic exercise;Energy  conservation;Patient/family education    OT Goals(Current goals can be found in the care plan section) Acute Rehab OT Goals Patient Stated Goal: eat breakfast OT Goal Formulation: With patient Time For Goal Achievement: 08/02/23 Potential to Achieve Goals: Good ADL Goals Pt Will Perform Grooming: with modified independence;sitting;standing Pt Will Perform Lower Body Dressing: with modified independence;with adaptive equipment;sitting/lateral leans;sit to/from stand Pt Will Transfer to Toilet: with modified independence;ambulating Pt Will Perform Toileting - Clothing Manipulation and hygiene: with modified independence;sitting/lateral leans;sit to/from stand  OT Frequency: Min 1X/week    Co-evaluation              AM-PAC OT "6 Clicks" Daily Activity     Outcome Measure Help from another person eating meals?: None Help from another person taking care of personal grooming?: None Help from another person toileting, which includes using toliet, bedpan, or urinal?: None Help from another person bathing (including washing, rinsing, drying)?: A Little Help from another person to put on and taking off regular upper body clothing?: None Help from another person to put on and taking off regular lower body clothing?: A Little 6 Click Score: 22   End of Session Equipment Utilized During Treatment: Rolling walker (2 wheels) Nurse Communication: Mobility status  Activity Tolerance: Patient tolerated treatment well Patient left: in chair;with call bell/phone within reach  OT Visit Diagnosis: Other abnormalities of gait and mobility (R26.89)  Time: 1610-9604 OT Time Calculation (min): 18 min Charges:  OT General Charges $OT Visit: 1 Visit OT Evaluation $OT Eval Low Complexity: 1 Low  Oleta Mouse, OTD OTR/L  07/19/23, 9:55 AM

## 2023-07-19 NOTE — Anesthesia Postprocedure Evaluation (Signed)
Anesthesia Post Note  Patient: Lindsey Freeman  Procedure(s) Performed: TOTAL HIP ARTHROPLASTY (Right: Hip)  Patient location during evaluation: Nursing Unit Anesthesia Type: General Level of consciousness: awake and alert Pain management: pain level controlled Vital Signs Assessment: post-procedure vital signs reviewed and stable Respiratory status: spontaneous breathing, nonlabored ventilation, respiratory function stable and patient connected to nasal cannula oxygen Cardiovascular status: blood pressure returned to baseline and stable Postop Assessment: no apparent nausea or vomiting Anesthetic complications: no   No notable events documented.   Last Vitals:  Vitals:   07/19/23 0017 07/19/23 0435  BP: (!) 107/58 (!) 104/55  Pulse: 76 74  Resp: 16 16  Temp: 36.9 C 36.9 C  SpO2: 99% 96%    Last Pain:  Vitals:   07/18/23 2001  TempSrc:   PainSc: 4                  [name]

## 2023-07-19 NOTE — Progress Notes (Signed)
DISCHARGE NOTE:  Pt given discharge instructions and scripts. Pt verbalized understanding. TED hose on both legs. Pt wheeled to car by staff, husband providing transportation home.

## 2023-07-19 NOTE — TOC Initial Note (Signed)
Transition of Care [facility]) - Initial/Assessment Note    Patient Details  Name: Lindsey Freeman MRN: [id number] Date of Birth: Feb 21, 1946  [facility]) CM/SW Contact:    Marlowe Sax, RN Phone Number: 07/19/2023, 8:44 AM  Clinical Narrative:                  The patient Is set up for [facility] prior to surgery by surgeons office  The patient reports having DME at home       Patient Goals and CMS Choice            Expected Discharge Plan and Services         Expected Discharge Date: 07/19/23                                    Prior Living Arrangements/Services                       Activities of Daily Living   ADL Screening (condition at time of admission) Independently performs ADLs?: Yes (appropriate for developmental age) Is the patient deaf or have difficulty hearing?: No Does the patient have difficulty seeing, even when wearing glasses/contacts?: No (wears glasses) Does the patient have difficulty concentrating, remembering, or making decisions?: No  Permission Sought/Granted                  Emotional Assessment              Admission diagnosis:  Hx of total hip arthroplasty, right [W09.811] Patient Active Problem List   Diagnosis Date Noted   Hx of total hip arthroplasty, right 07/18/2023   Elevated serum creatinine 09/02/2020   Eczema 03/07/2019   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, [place] - [address] [place] [address] Phone: [phone] Fax: [phone]     Social Drivers of Health (SDOH) Social History: SDOH Screenings   Food Insecurity: No Food Insecurity (07/18/2023)  Housing: Low Risk  (07/18/2023)  Transportation Needs: No Transportation  Needs (07/18/2023)  Utilities: Not At Risk (07/18/2023)  Financial Resource Strain: Low Risk  (01/06/2023)   Received from [facility]  Social Connections: Socially Integrated (07/18/2023)  Tobacco Use: Low Risk  (07/18/2023)   SDOH Interventions:     Readmission Risk Interventions     No data to display

## 2023-07-19 NOTE — Plan of Care (Signed)
Problem: Activity: Goal: Ability to avoid complications of mobility impairment will improve Outcome: Progressing   Problem: Pain Management: Goal: Pain level will decrease with appropriate interventions Outcome: Progressing

## 2023-07-19 NOTE — Progress Notes (Signed)
Physical Therapy Treatment Patient Details Name: Lindsey Freeman MRN: [id number] DOB: [birth year] Today's Date: 07/19/2023   History of Present Illness Pt is a 78 y.o. 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  PMH includes L THA.    PT Comments  Pt resting in recliner upon PT arrival; pt agreeable to therapy.  R hip/thigh pain 2/10 at rest beginning of session and 4/10 with activity/at rest end of session (nurse updated on pt's pain status; pt with recent pain medication).  Pt requiring initial cueing for posterior THP's (to verbalize) but overall pt then did well maintaining posterior THP's during session's activities.  Reviewed fall prevention, home safety, and safe car transfers: pt verbalizing appropriate understanding.  Reviewed LE THA HEP (pt issued HEP handout): pt verbalizing and demonstrating appropriate understanding.  During session pt SBA with bed mobility; SBA with transfers using RW; and SBA ambulating 160 feet with RW use.  Pt appearing steady and safe with functional mobility during sessions activities.  Pt appears safe to discharge home when medically appropriate: pt's nurse updated on pt's status.  Pt reports no questions/concerns for home discharge today; pt planning for HHPT.   If plan is discharge home, recommend the following: A little help with walking and/or transfers;A little help with bathing/dressing/bathroom;Assistance with cooking/housework;Assist for transportation;Help with stairs or ramp for entrance   Can travel by private vehicle      Yes  Equipment Recommendations  None recommended by PT (pt has RW at home already)    Recommendations for Other Services       Precautions / Restrictions Precautions Precautions: Posterior Hip;Fall Precaution Booklet Issued: Yes (comment) Precaution Comments: Pt wears L knee brace d/t prior injury Restrictions Weight Bearing Restrictions Per Provider Order: Yes RLE Weight Bearing Per Provider Order: Weight  bearing as tolerated     Mobility  Bed Mobility Overal bed mobility: Needs Assistance Bed Mobility: Supine to Sit, Sit to Supine     Supine to sit: Supervision Sit to supine: Supervision   General bed mobility comments: bed flat; mild increased effort to perform on own    Transfers Overall transfer level: Needs assistance Equipment used: Rolling walker (2 wheels) Transfers: Sit to/from Stand Sit to Stand: Supervision           General transfer comment: steady transfers (x1 trial from recliner; x1 trial from bed; x1 trial from [facility] over toilet)    Ambulation/Gait Ambulation/Gait assistance: Supervision Gait Distance (Feet): 160 Feet Assistive device: Rolling walker (2 wheels) Gait Pattern/deviations: Step-through pattern Gait velocity: decreased     General Gait Details: antalgic; decreased stance time R LE; steady ambulating with RW use   Stairs Stairs:  (Pt declined stairs trial d/t L knee injury (pt concerned about further injury since L LE was her "good" leg and would put a lot of pressure through it) and has stair lift at home (doesn't have to do any stairs))           Wheelchair Mobility     Tilt Bed    Modified Rankin (Stroke Patients Only)       Balance Overall balance assessment: Needs assistance Sitting-balance support: No upper extremity supported, Feet supported Sitting balance-Leahy Scale: Good Sitting balance - Comments: steady reaching within BOS   Standing balance support: Bilateral upper extremity supported, During functional activity, Reliant on assistive device for balance Standing balance-Leahy Scale: Good Standing balance comment: steady ambulating with RW use; steady washing hands at sink and managing clothing in standing  for toileting                            Cognition Arousal: Alert Behavior During Therapy: WFL for tasks assessed/performed Overall Cognitive Status: Within Functional Limits for tasks assessed                                           Exercises Total Joint Exercises Ankle Circles/Pumps: AROM, Strengthening, Both, 10 reps, Supine Quad Sets: AROM, Strengthening, Both, 10 reps, Supine Gluteal Sets: AROM, Strengthening, Both, 10 reps, Supine Towel Squeeze: AROM, Strengthening, Both, 10 reps, Supine Short Arc Quad: AROM, Strengthening, Right, 10 reps, Supine Heel Slides: AAROM, Strengthening, Right, 10 reps, Supine Hip ABduction/ADduction: AAROM, Strengthening, Right, 10 reps, Supine Long Arc Quad: AROM, Strengthening, Right, 10 reps, Seated    General Comments General comments (skin integrity, edema, etc.): Minimal drainage noted R hip/thigh dressing pre/post session (no change during session).  Nursing cleared pt for participation in physical therapy.  Pt agreeable to PT session.      Pertinent Vitals/Pain Pain Assessment Pain Assessment: 0-10 Pain Score: 4  Pain Location: R hip Pain Descriptors / Indicators: Aching, Discomfort, Sore Pain Intervention(s): Limited activity within patient's tolerance, Monitored during session, Premedicated before session, Repositioned, Ice applied HR 79-96 bpm and SpO2 sats 97% or greater on room air during sessions activities.    Home Living          Prior Function            PT Goals (current goals can now be found in the care plan section) Acute Rehab PT Goals Patient Stated Goal: to improve pain and walking PT Goal Formulation: With patient Time For Goal Achievement: 08/01/23 Potential to Achieve Goals: Good Progress towards PT goals: Progressing toward goals    Frequency    BID      PT Plan      Co-evaluation              AM-PAC PT "6 Clicks" Mobility   Outcome Measure  Help needed turning from your back to your side while in a flat bed without using bedrails?: None Help needed moving from lying on your back to sitting on the side of a flat bed without using bedrails?: None Help needed  moving to and from a bed to a chair (including a wheelchair)?: A Little Help needed standing up from a chair using your arms (e.g., wheelchair or bedside chair)?: A Little Help needed to walk in hospital room?: A Little Help needed climbing 3-5 steps with a railing? : A Little 6 Click Score: 20    End of Session Equipment Utilized During Treatment: Gait belt Activity Tolerance: Patient tolerated treatment well Patient left: in chair;with call bell/phone within reach;Other (comment) (pillow between pt's knees for posterior THP's) Nurse Communication: Mobility status;Other (comment) (Pt's pain status) PT Visit Diagnosis: Other abnormalities of gait and mobility (R26.89);Muscle weakness (generalized) (M62.81);Pain Pain - Right/Left: Right Pain - part of body: Hip     Time: 1026-1100 PT Time Calculation (min) (ACUTE ONLY): 34 min  Charges:    $Gait Training: 8-22 mins $Therapeutic Exercise: 8-22 mins PT General Charges $$ ACUTE PT VISIT: 1 Visit                    Hendricks Limes, PT 07/19/23, 11:22 AM

## 2023-07-19 NOTE — Care Management Obs Status (Signed)
MEDICARE OBSERVATION STATUS NOTIFICATION   Patient Details  Name: Lindsey Freeman MRN: [id number] Date of Birth: June 21, 1946   Medicare Observation Status Notification Given:  Yes    Sherilyn Banker 07/19/2023, 11:45 AM
